# Patient Record
Sex: Male | Born: 1945
Health system: Southern US, Community
[De-identification: ages and names within clinical notes are randomized; demographics above are authoritative.]

## PROBLEM LIST (undated history)

## (undated) DIAGNOSIS — I272 Pulmonary hypertension, unspecified: Secondary | ICD-10-CM

## (undated) DIAGNOSIS — I071 Rheumatic tricuspid insufficiency: Secondary | ICD-10-CM

## (undated) DIAGNOSIS — R0600 Dyspnea, unspecified: Secondary | ICD-10-CM

## (undated) DIAGNOSIS — I340 Nonrheumatic mitral (valve) insufficiency: Secondary | ICD-10-CM

## (undated) DIAGNOSIS — M1990 Unspecified osteoarthritis, unspecified site: Secondary | ICD-10-CM

## (undated) DIAGNOSIS — B748 Other filariases: Secondary | ICD-10-CM

## (undated) HISTORY — PX: SHOULDER SURGERY: SHX246

---

## 2015-12-21 DIAGNOSIS — M9903 Segmental and somatic dysfunction of lumbar region: Secondary | ICD-10-CM | POA: Diagnosis not present

## 2015-12-21 DIAGNOSIS — M9902 Segmental and somatic dysfunction of thoracic region: Secondary | ICD-10-CM | POA: Diagnosis not present

## 2015-12-21 DIAGNOSIS — M791 Myalgia: Secondary | ICD-10-CM | POA: Diagnosis not present

## 2015-12-21 DIAGNOSIS — M7541 Impingement syndrome of right shoulder: Secondary | ICD-10-CM | POA: Diagnosis not present

## 2015-12-21 DIAGNOSIS — M4716 Other spondylosis with myelopathy, lumbar region: Secondary | ICD-10-CM | POA: Diagnosis not present

## 2015-12-21 DIAGNOSIS — M542 Cervicalgia: Secondary | ICD-10-CM | POA: Diagnosis not present

## 2015-12-21 DIAGNOSIS — M9905 Segmental and somatic dysfunction of pelvic region: Secondary | ICD-10-CM | POA: Diagnosis not present

## 2015-12-28 DIAGNOSIS — M791 Myalgia: Secondary | ICD-10-CM | POA: Diagnosis not present

## 2015-12-28 DIAGNOSIS — M9902 Segmental and somatic dysfunction of thoracic region: Secondary | ICD-10-CM | POA: Diagnosis not present

## 2015-12-28 DIAGNOSIS — M9905 Segmental and somatic dysfunction of pelvic region: Secondary | ICD-10-CM | POA: Diagnosis not present

## 2015-12-28 DIAGNOSIS — M7541 Impingement syndrome of right shoulder: Secondary | ICD-10-CM | POA: Diagnosis not present

## 2015-12-28 DIAGNOSIS — M542 Cervicalgia: Secondary | ICD-10-CM | POA: Diagnosis not present

## 2015-12-28 DIAGNOSIS — M4716 Other spondylosis with myelopathy, lumbar region: Secondary | ICD-10-CM | POA: Diagnosis not present

## 2016-05-04 DIAGNOSIS — R001 Bradycardia, unspecified: Secondary | ICD-10-CM | POA: Diagnosis not present

## 2016-07-11 DIAGNOSIS — M9905 Segmental and somatic dysfunction of pelvic region: Secondary | ICD-10-CM | POA: Diagnosis not present

## 2016-07-11 DIAGNOSIS — M9902 Segmental and somatic dysfunction of thoracic region: Secondary | ICD-10-CM | POA: Diagnosis not present

## 2016-07-11 DIAGNOSIS — M9903 Segmental and somatic dysfunction of lumbar region: Secondary | ICD-10-CM | POA: Diagnosis not present

## 2016-07-11 DIAGNOSIS — M4716 Other spondylosis with myelopathy, lumbar region: Secondary | ICD-10-CM | POA: Diagnosis not present

## 2016-07-13 DIAGNOSIS — M4716 Other spondylosis with myelopathy, lumbar region: Secondary | ICD-10-CM | POA: Diagnosis not present

## 2016-07-13 DIAGNOSIS — M9902 Segmental and somatic dysfunction of thoracic region: Secondary | ICD-10-CM | POA: Diagnosis not present

## 2016-07-13 DIAGNOSIS — M9903 Segmental and somatic dysfunction of lumbar region: Secondary | ICD-10-CM | POA: Diagnosis not present

## 2016-07-13 DIAGNOSIS — M9905 Segmental and somatic dysfunction of pelvic region: Secondary | ICD-10-CM | POA: Diagnosis not present

## 2016-07-17 DIAGNOSIS — M9903 Segmental and somatic dysfunction of lumbar region: Secondary | ICD-10-CM | POA: Diagnosis not present

## 2016-07-17 DIAGNOSIS — M9902 Segmental and somatic dysfunction of thoracic region: Secondary | ICD-10-CM | POA: Diagnosis not present

## 2016-07-17 DIAGNOSIS — M9905 Segmental and somatic dysfunction of pelvic region: Secondary | ICD-10-CM | POA: Diagnosis not present

## 2016-07-17 DIAGNOSIS — M4716 Other spondylosis with myelopathy, lumbar region: Secondary | ICD-10-CM | POA: Diagnosis not present

## 2016-07-24 DIAGNOSIS — M9905 Segmental and somatic dysfunction of pelvic region: Secondary | ICD-10-CM | POA: Diagnosis not present

## 2016-07-24 DIAGNOSIS — M9903 Segmental and somatic dysfunction of lumbar region: Secondary | ICD-10-CM | POA: Diagnosis not present

## 2016-07-24 DIAGNOSIS — M4716 Other spondylosis with myelopathy, lumbar region: Secondary | ICD-10-CM | POA: Diagnosis not present

## 2016-07-24 DIAGNOSIS — M9902 Segmental and somatic dysfunction of thoracic region: Secondary | ICD-10-CM | POA: Diagnosis not present

## 2016-07-31 ENCOUNTER — Ambulatory Visit (INDEPENDENT_AMBULATORY_CARE_PROVIDER_SITE_OTHER): Payer: BLUE CROSS/BLUE SHIELD | Admitting: Orthopedic Surgery

## 2016-07-31 ENCOUNTER — Encounter (INDEPENDENT_AMBULATORY_CARE_PROVIDER_SITE_OTHER): Payer: Self-pay | Admitting: Orthopedic Surgery

## 2016-07-31 ENCOUNTER — Ambulatory Visit (INDEPENDENT_AMBULATORY_CARE_PROVIDER_SITE_OTHER): Payer: BLUE CROSS/BLUE SHIELD

## 2016-07-31 DIAGNOSIS — M4722 Other spondylosis with radiculopathy, cervical region: Secondary | ICD-10-CM | POA: Diagnosis not present

## 2016-07-31 DIAGNOSIS — M5441 Lumbago with sciatica, right side: Secondary | ICD-10-CM

## 2016-07-31 DIAGNOSIS — M5442 Lumbago with sciatica, left side: Secondary | ICD-10-CM

## 2016-07-31 DIAGNOSIS — G8929 Other chronic pain: Secondary | ICD-10-CM | POA: Diagnosis not present

## 2016-07-31 NOTE — Progress Notes (Signed)
Office Visit Note   Patient: Keith Fisher           Date of Birth: 08/22/1946           MRN: [id number] Visit Date: 07/31/2016 Requested by: No referring provider defined for this encounter. PCP: Pcp Not In System  Subjective: Chief Complaint  Patient presents with  . Lower Back - Pain    HPI Keith Fisher is a 70 year old patient with upper and lower extremity weakness and symptoms.  He describes low back pain and bilateral leg pain.  Interestingly he had history of care site infection in the right leg which is resolved but that has left him with chronic edema in that right lower extremity.  He wears compression socks.  He states that he can't sit very long or his legs and back become very painful.  This been coming very severe over the past month.  Wakes him from sleep at night.  He's had a back issue before.  He has an old MRI scan from in the 4 years ago which I reviewed today.  The report is also reviewed.  Nothing really definitively localizing to the right hip flexor innervated muscles or the left ankle dorsiflexor innervated muscles.  His hips appeared intact on the scan as well.  He did have some cervical stenosis and some type of retro-cerebellar cystic lesion present which was not fully described but present on my inspection.  Period the patient describes significant pain in both legs radiating below the knee.  It is woken him every night for years.  He takes diclofenac twice a day for the problem.  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              Review of Systems All systems reviewed are negative as they relate to the chief complaint within the history of present illness.  Patient denies  fevers or chills.    Assessment & Plan: Visit Diagnoses:  1. Chronic bilateral low back pain with bilateral sciatica   2. Other spondylosis with radiculopathy, cervical region     Plan: Impression is right hip flexor weakness left ankle dorsiflexion  weakness with no real clear finding to explain this on MRI scan from 4 years ago.  Plain radiographs today do show some spondylosis but no spondylolisthesis.  The patient also has upper extremity interosseous wasting on the right compared to the left.  This is asymmetric also.  Cervical stenosis was present and is the more impressive finding between the lumbar and cervical spine from the MRI scan reviewed from 4 years ago.  I think no high needs MRI scan of both the neck and low back to evaluate this fairly incapacitating pain.  I don't think he is much of a complainer and so for him to be here with these symptoms is significant.  Follow-Up Instructions: Return for after MRI.   Orders:  Orders Placed This Encounter  Procedures  . XR Lumbar Spine 2-3 Views   No orders of the defined types were placed in this encounter.     Procedures: No procedures performed   Clinical Data: No additional findings.  Objective: Vital Signs: There were no vitals taken for this visit.  Physical Exam  Constitutional: He appears well-developed.  HENT:  Head: Normocephalic.  Eyes: EOM are normal.  Neck: Normal range of motion.  Cardiovascular: Normal rate.   Pulmonary/Chest: Effort normal.  Neurological: He is alert.  Skin: Skin  is warm.  Psychiatric: He has a normal mood and affect.    Ortho Exam examination of the legs demonstrates palpable pedal pulses stronger on the left than the right.  He does have fairly significant lymphedema-type swelling in the right lower extremity which is consistent with his parasite infection from the 70s he has fairly significant hip flexor weakness on the right compared to the left.  He also has ankle dorsiflexion weakness on the left compared to the right.  Reflexes 2+ out of 4 patella on the left 0 out of 4 patella on the right.  0 out of 4 bilateral Achilles.  Negative Babinski negative clonus.  There is no groin pain with internal/external rotation of either leg.  No  definite paresthesias L1-S1 bilaterally.  On the upper extremities he has interosseous weakness and wasting on the right arm compared to the left.  Pretty reasonable cervical spine range of motion.  Specialty Comments:  No specialty comments available.  Imaging: Xr Lumbar Spine 2-3 Views  Result Date: 07/31/2016 AP lateral lumbar spine ordered and obtained.  Some scoliosis is present.  Less than 20.  Hip joints maintained.  Bony pelvis otherwise normal.  There is some degenerative disc disease in the mid lumbar and lower lumbar spine area but no spondylolisthesis no evidence of compression fracture    PMFS History: There are no active problems to display for this patient.  No past medical history on file.  No family history on file.  No past surgical history on file. Social History   Occupational History  . Not on file.   Social History Main Topics  . Smoking status: Never Smoker  . Smokeless tobacco: Never Used  . Alcohol use No  . Drug use: No  . Sexual activity: Not on file

## 2016-08-02 MED ORDER — TRAMADOL HCL 50 MG PO TABS: 50.0000 mg | ORAL_TABLET | Freq: Four times a day (QID) | ORAL | 0 refills | Status: DC | PRN

## 2016-08-02 NOTE — Addendum Note (Signed)
Addended by: Rise PaganiniEAN, Callie Bunyard on: 08/02/2016 01:56 PM   Modules accepted: Orders

## 2016-08-02 NOTE — Addendum Note (Signed)
Addended by: Rise PaganiniEAN, Ronny Korff on: 08/02/2016 12:04 PM   Modules accepted: Orders

## 2016-08-12 ENCOUNTER — Ambulatory Visit
Admission: RE | Admit: 2016-08-12 | Discharge: 2016-08-12 | Disposition: A | Discharge status: Home / Self Care | Payer: BLUE CROSS/BLUE SHIELD | Source: Ambulatory Visit | Attending: Orthopedic Surgery | Admitting: Orthopedic Surgery

## 2016-08-12 DIAGNOSIS — M5126 Other intervertebral disc displacement, lumbar region: Secondary | ICD-10-CM | POA: Diagnosis not present

## 2016-08-12 DIAGNOSIS — M4802 Spinal stenosis, cervical region: Secondary | ICD-10-CM | POA: Diagnosis not present

## 2016-08-12 DIAGNOSIS — G8929 Other chronic pain: Secondary | ICD-10-CM

## 2016-08-12 DIAGNOSIS — M5442 Lumbago with sciatica, left side: Principal | ICD-10-CM

## 2016-08-12 DIAGNOSIS — M5441 Lumbago with sciatica, right side: Principal | ICD-10-CM

## 2016-08-12 DIAGNOSIS — M4722 Other spondylosis with radiculopathy, cervical region: Secondary | ICD-10-CM

## 2016-08-14 DIAGNOSIS — L409 Psoriasis, unspecified: Secondary | ICD-10-CM | POA: Diagnosis not present

## 2016-08-18 ENCOUNTER — Ambulatory Visit (INDEPENDENT_AMBULATORY_CARE_PROVIDER_SITE_OTHER): Payer: BLUE CROSS/BLUE SHIELD | Admitting: Orthopedic Surgery

## 2016-08-18 DIAGNOSIS — M5441 Lumbago with sciatica, right side: Secondary | ICD-10-CM

## 2016-08-18 DIAGNOSIS — M5442 Lumbago with sciatica, left side: Secondary | ICD-10-CM | POA: Diagnosis not present

## 2016-08-18 DIAGNOSIS — G8929 Other chronic pain: Secondary | ICD-10-CM | POA: Diagnosis not present

## 2016-08-18 MED ORDER — DICLOFENAC POTASSIUM 50 MG PO TABS: 50.0000 mg | ORAL_TABLET | Freq: Two times a day (BID) | ORAL | 0 refills | Status: DC

## 2016-08-18 MED ORDER — OXYCODONE HCL 5 MG PO CAPS: 5.0000 mg | ORAL_CAPSULE | ORAL | 0 refills | Status: DC | PRN

## 2016-08-21 ENCOUNTER — Encounter (INDEPENDENT_AMBULATORY_CARE_PROVIDER_SITE_OTHER): Payer: Self-pay | Admitting: Orthopedic Surgery

## 2016-08-21 NOTE — Progress Notes (Signed)
Office Visit Note   Patient: Keith Fisher           Date of Birth: 06/06/1946           MRN: [id number] Visit Date: 08/18/2016 Requested by: No referring provider defined for this encounter. PCP: Pcp Not In System  Subjective: No chief complaint on file. Neck and back pain  HPI mile on is a 70 year old patient here for follow-up of his of our spine cervical spine scan.  Patient's is hard for him to sit.  Lyrica does help him.  On the left side he has mostly paresthesias on the right side the paresthesias are less but is painful.  He's having some neck pain and he has seen a chiropractor. l taking diclofenac which has helped as well.  He denies any bowel or bladder symptoms.              Review of Systems All systems reviewed are negative as they relate to the chief complaint within the history of present illness.  Patient denies  fevers or chills.    Assessment & Plan: Visit Diagnoses:  1. Chronic bilateral low back pain with bilateral sciatica     Plan: Impression is pretty significant right-sided and left-sided spinal stenosis.  His scan does correlate with the bilateral symptoms he is having.  He does not have as much weakness today which is an improvement.  Nonetheless I think based on the amount of pain he is having with sitting that he would be well advised to have a neurosurgical evaluation as well as epidural steroid injection in the lumbar spine.  In regards to the neck he has pretty severe foraminal stenosis both sides at multiple levels.  That's something that can be worked up following his back which is more symptomatic.  Follow-Up Instructions: Return if symptoms worsen or fail to improve.   Orders:  Orders Placed This Encounter  Procedures  . Ambulatory referral to Physical Medicine Rehab  . Ambulatory referral to Neurosurgery   Meds ordered this encounter  Medications  . oxycodone (OXY-IR) 5 MG capsule    Sig: Take 1 capsule (5 mg total) by mouth every 4 (four) hours  as needed.    Dispense:  60 capsule    Refill:  0  . diclofenac (CATAFLAM) 50 MG tablet    Sig: Take 1 tablet (50 mg total) by mouth 2 (two) times daily.    Dispense:  60 tablet    Refill:  0      Procedures: No procedures performed   Clinical Data: No additional findings.  Objective: Vital Signs: There were no vitals taken for this visit.  Physical Exam  Constitutional: He appears well-developed.  HENT:  Head: Normocephalic.  Eyes: EOM are normal.  Neck: Normal range of motion.  Cardiovascular: Normal rate.   Pulmonary/Chest: Effort normal.  Neurological: He is alert.  Skin: Skin is warm.  Psychiatric: He has a normal mood and affect.    Ortho Exam examination of the right leg demonstrates swelling from the previously mentioned parasitic infection which is now resolved.  He does have improved hip flexion strength today 5 minus out of 5 bilaterally.  No groin pain with internal/external rotation leg.  Also has improved ankle dorsiflexion strength bilaterally.  Paresthesias present in the L4-5 distribution on both sides.  Both feet are perfused.  Negative Babinski negative clonus.  Specialty Comments:  No specialty comments available.  Imaging: No results found.   PMFS History: There are no  active problems to display for this patient.  No past medical history on file.  No family history on file.  No past surgical history on file. Social History   Occupational History  . Not on file.   Social History Main Topics  . Smoking status: Never Smoker  . Smokeless tobacco: Never Used  . Alcohol use No  . Drug use: No  . Sexual activity: Not on file

## 2016-08-22 ENCOUNTER — Telehealth (INDEPENDENT_AMBULATORY_CARE_PROVIDER_SITE_OTHER): Payer: Self-pay | Admitting: Orthopedic Surgery

## 2016-08-22 NOTE — Telephone Encounter (Signed)
Pt son stating that pt needs a referral to see Dr. Wynetta Emeryram, spine specialist. He stated Dr. August Saucerean said he wanted pt to see him. Dipesh Smith RobertRao, son, phone number is [phone]

## 2016-08-24 NOTE — Telephone Encounter (Signed)
Left message that referral was made and that we are waiting on their office to schedule appointment

## 2016-08-26 DIAGNOSIS — Z23 Encounter for immunization: Secondary | ICD-10-CM | POA: Diagnosis not present

## 2016-08-28 ENCOUNTER — Encounter (INDEPENDENT_AMBULATORY_CARE_PROVIDER_SITE_OTHER): Payer: BLUE CROSS/BLUE SHIELD | Admitting: Physical Medicine and Rehabilitation

## 2016-09-14 DIAGNOSIS — M4716 Other spondylosis with myelopathy, lumbar region: Secondary | ICD-10-CM | POA: Diagnosis not present

## 2016-09-14 DIAGNOSIS — M9905 Segmental and somatic dysfunction of pelvic region: Secondary | ICD-10-CM | POA: Diagnosis not present

## 2016-09-14 DIAGNOSIS — M9902 Segmental and somatic dysfunction of thoracic region: Secondary | ICD-10-CM | POA: Diagnosis not present

## 2016-09-14 DIAGNOSIS — M9903 Segmental and somatic dysfunction of lumbar region: Secondary | ICD-10-CM | POA: Diagnosis not present

## 2016-09-18 DIAGNOSIS — M9903 Segmental and somatic dysfunction of lumbar region: Secondary | ICD-10-CM | POA: Diagnosis not present

## 2016-09-18 DIAGNOSIS — M4716 Other spondylosis with myelopathy, lumbar region: Secondary | ICD-10-CM | POA: Diagnosis not present

## 2016-09-18 DIAGNOSIS — M9905 Segmental and somatic dysfunction of pelvic region: Secondary | ICD-10-CM | POA: Diagnosis not present

## 2016-09-18 DIAGNOSIS — M9902 Segmental and somatic dysfunction of thoracic region: Secondary | ICD-10-CM | POA: Diagnosis not present

## 2016-09-21 DIAGNOSIS — M5137 Other intervertebral disc degeneration, lumbosacral region: Secondary | ICD-10-CM | POA: Diagnosis not present

## 2016-09-21 DIAGNOSIS — M542 Cervicalgia: Secondary | ICD-10-CM | POA: Diagnosis not present

## 2016-09-21 DIAGNOSIS — G959 Disease of spinal cord, unspecified: Secondary | ICD-10-CM | POA: Diagnosis not present

## 2016-09-26 ENCOUNTER — Other Ambulatory Visit (INDEPENDENT_AMBULATORY_CARE_PROVIDER_SITE_OTHER): Payer: Self-pay | Admitting: Orthopedic Surgery

## 2016-09-26 NOTE — Telephone Encounter (Signed)
Rx request 

## 2016-10-26 ENCOUNTER — Other Ambulatory Visit (INDEPENDENT_AMBULATORY_CARE_PROVIDER_SITE_OTHER): Payer: Self-pay | Admitting: Orthopedic Surgery

## 2016-10-26 NOTE — Telephone Encounter (Signed)
Rx request 

## 2016-11-26 ENCOUNTER — Other Ambulatory Visit (INDEPENDENT_AMBULATORY_CARE_PROVIDER_SITE_OTHER): Payer: Self-pay | Admitting: Orthopedic Surgery

## 2016-11-27 NOTE — Telephone Encounter (Signed)
Rx request 

## 2016-12-04 DIAGNOSIS — L409 Psoriasis, unspecified: Secondary | ICD-10-CM | POA: Diagnosis not present

## 2016-12-23 ENCOUNTER — Other Ambulatory Visit (INDEPENDENT_AMBULATORY_CARE_PROVIDER_SITE_OTHER): Payer: Self-pay | Admitting: Orthopedic Surgery

## 2016-12-25 NOTE — Telephone Encounter (Signed)
Rx request 

## 2016-12-25 NOTE — Telephone Encounter (Signed)
Okay to refill please call thanks

## 2017-01-08 DIAGNOSIS — L2081 Atopic neurodermatitis: Secondary | ICD-10-CM | POA: Diagnosis not present

## 2017-01-16 DIAGNOSIS — B749 Filariasis, unspecified: Secondary | ICD-10-CM | POA: Diagnosis not present

## 2017-01-16 DIAGNOSIS — M4712 Other spondylosis with myelopathy, cervical region: Secondary | ICD-10-CM | POA: Diagnosis not present

## 2017-01-16 DIAGNOSIS — E538 Deficiency of other specified B group vitamins: Secondary | ICD-10-CM | POA: Diagnosis not present

## 2017-01-16 DIAGNOSIS — G959 Disease of spinal cord, unspecified: Secondary | ICD-10-CM | POA: Diagnosis not present

## 2017-01-18 ENCOUNTER — Other Ambulatory Visit (INDEPENDENT_AMBULATORY_CARE_PROVIDER_SITE_OTHER): Payer: Self-pay | Admitting: Orthopedic Surgery

## 2017-01-18 NOTE — Telephone Encounter (Signed)
Rx request 

## 2017-01-23 ENCOUNTER — Telehealth (INDEPENDENT_AMBULATORY_CARE_PROVIDER_SITE_OTHER): Payer: Self-pay | Admitting: Radiology

## 2017-01-23 NOTE — Telephone Encounter (Signed)
Ok to rf pls clal thx did he see [facility]?

## 2017-01-23 NOTE — Telephone Encounter (Signed)
This was refilled, msg to Lauren to check on [facility] eval, she will advise.

## 2017-01-23 NOTE — Telephone Encounter (Signed)
Ok to rf did he see [facility]

## 2017-01-23 NOTE — Telephone Encounter (Signed)
Can you call son and see if patient ever saw [facility]?  Dr August Saucerean approved refill diclofenac, I did this, but he wanted to know the above as well.  Thanks.

## 2017-01-23 NOTE — Telephone Encounter (Signed)
IC no answer. LM for son to please call me back to discuss.

## 2017-02-06 ENCOUNTER — Ambulatory Visit
Admission: RE | Admit: 2017-02-06 | Discharge: 2017-02-06 | Disposition: A | Discharge status: Home / Self Care | Payer: BLUE CROSS/BLUE SHIELD | Source: Ambulatory Visit | Attending: Family Medicine | Admitting: Family Medicine

## 2017-02-06 ENCOUNTER — Other Ambulatory Visit: Payer: Self-pay | Admitting: Family Medicine

## 2017-02-06 DIAGNOSIS — R079 Chest pain, unspecified: Secondary | ICD-10-CM

## 2017-02-06 DIAGNOSIS — R0602 Shortness of breath: Secondary | ICD-10-CM | POA: Diagnosis not present

## 2017-02-06 DIAGNOSIS — R42 Dizziness and giddiness: Secondary | ICD-10-CM | POA: Diagnosis not present

## 2017-02-06 DIAGNOSIS — R001 Bradycardia, unspecified: Secondary | ICD-10-CM | POA: Diagnosis not present

## 2017-02-06 DIAGNOSIS — D649 Anemia, unspecified: Secondary | ICD-10-CM | POA: Diagnosis not present

## 2017-02-07 DIAGNOSIS — R0609 Other forms of dyspnea: Secondary | ICD-10-CM | POA: Diagnosis not present

## 2017-02-07 DIAGNOSIS — R001 Bradycardia, unspecified: Secondary | ICD-10-CM | POA: Diagnosis not present

## 2017-02-07 DIAGNOSIS — M4800 Spinal stenosis, site unspecified: Secondary | ICD-10-CM | POA: Diagnosis not present

## 2017-02-07 DIAGNOSIS — R079 Chest pain, unspecified: Secondary | ICD-10-CM | POA: Diagnosis not present

## 2017-02-08 DIAGNOSIS — R0602 Shortness of breath: Secondary | ICD-10-CM | POA: Diagnosis not present

## 2017-02-08 DIAGNOSIS — R079 Chest pain, unspecified: Secondary | ICD-10-CM | POA: Diagnosis not present

## 2017-02-09 DIAGNOSIS — R0789 Other chest pain: Secondary | ICD-10-CM | POA: Diagnosis not present

## 2017-02-09 DIAGNOSIS — R0602 Shortness of breath: Secondary | ICD-10-CM | POA: Diagnosis not present

## 2017-02-15 DIAGNOSIS — M5137 Other intervertebral disc degeneration, lumbosacral region: Secondary | ICD-10-CM | POA: Diagnosis not present

## 2017-02-16 DIAGNOSIS — R079 Chest pain, unspecified: Secondary | ICD-10-CM | POA: Diagnosis not present

## 2017-02-16 DIAGNOSIS — M4800 Spinal stenosis, site unspecified: Secondary | ICD-10-CM | POA: Diagnosis not present

## 2017-02-16 DIAGNOSIS — R0609 Other forms of dyspnea: Secondary | ICD-10-CM | POA: Diagnosis not present

## 2017-03-05 DIAGNOSIS — R0609 Other forms of dyspnea: Secondary | ICD-10-CM | POA: Diagnosis present

## 2017-03-05 DIAGNOSIS — R0600 Dyspnea, unspecified: Secondary | ICD-10-CM | POA: Diagnosis present

## 2017-03-05 DIAGNOSIS — I340 Nonrheumatic mitral (valve) insufficiency: Secondary | ICD-10-CM | POA: Diagnosis present

## 2017-03-05 NOTE — H&P (Signed)
OFFICE VISIT NOTES COPIED TO EPIC FOR DOCUMENTATION  . History of Present Illness Laverda Page MD; 27-Feb-2017 4:37 PM) Patient words: Last O/V 02/07/2017; F/U Nuc, Echo.  The patient is a 71 year old male who presents for a Follow-up for Chest pain.  Additional reasons for visit:  Follow-up for Shortness of breath is described as the following: Fairly active [place] male who I had seen about a year ago for asymptomatic bradycardia. He was last seen by me on 02/07/2017 for new onset of exertional chest pain, marked dyspnea on exertion and new murmur found on physical exam. He underwent nuclear stress testing along with echocardiogram. Accompanied by son at the bedside  In the interim will past 6-8 weeks, he has noticed worsening symptoms of sciatica and also severe left arm discomfort due to cervical spinal stenosis. I had given him restriction for Ultram which he states that has helped significantly. He requests something to sleep at night as back spasms at intubating him from sleeping well at night.  States that his symptoms of dyspnea and chest pain has improved some, but states that he has not been doing anything heavy exertional activity. Patient has history of filariasis and lympedema right leg.    Problem List/Past Medical (April Garrison; 02-27-2017 9:43 AM) Dermatitis (L30.9)  Anemia (D64.9)  Bradycardia by electrocardiogram (R00.1)  Labwork  Labs 02/07/2017: Iron reduced at 37, and saturation markedly reduced at 9, normal iron binding capacity and serum transferrin. HB 10.6/HCT 32.1, platelets 194, normal indicis. Serum glucose 97 mg, BUN 17, creatinine 1.20, eGFR 60 mL, potassium 5.3. AST 47, ALT 53 minimally elevated. TSH normal. 02/19/2015: Total cholesterol 140, triglycerides 93, HDL 54, LDL 68 Exertional chest pain (R07.9)  Lexiscan myoview stress test 02/09/2017: 1. The resting electrocardiogram demonstrated normal sinus rhythm, normal resting conduction, no resting  arrhythmias and normal rest repolarization. Stress EKG is non-diagnostic for ischemia as it a pharmacologic stress using Lexiscan. Stress symptoms included dyspnea. 2. Myocardial perfusion imaging is normal. Overall left ventricular systolic function was normal without regional wall motion abnormalities. The left ventricular ejection fraction was 75%. DOE (dyspnea on exertion) (R06.09)  Echocardiogram 02/08/2017: Left ventricle cavity is normal in size. Normal global wall motion. Doppler evidence of grade II (pseudonormal) diastolic dysfunction, elevated LAP. Calculated EF 68%. Left atrial cavity is moderate to severely dilated. LA is much larger than the measured 4.8 cm in AP diameter Right atrial cavity is moderate to severely dilated. Trileaflet aortic valve with trace regurgitation. Mild prolapse of the mitral valve leaflets. Moderate to severe mitral regurgitation. Severe tricuspid regurgitation. Moderate pulmonary hypertension. Pulmonary artery systolic pressure is estimated at 51 mm Hg. Mild pulmonic regurgitation. Insignificant pericardial effusion. IVC is dilated with respiratory variation. Spinal stenosis, multilevel (M48.00)   Allergies (April Garrison; 02/27/2017 9:43 AM) No Known Drug Allergies [05/04/2016]:  Family History (April Louretta Shorten; Feb 27, 2017 9:43 AM) Mother  Deceased. late in life; no information available Father  Deceased. late in life but no other information available Siblings  Patient has a total of 11 siblings of whom only 1 has a known cardiovascular condition.  Social History (April Garrison; Feb 27, 2017 9:43 AM) Marital status  Married. Living Situation  Lives with spouse. Number of Children  1. Current tobacco use  Never smoker. Non Drinker/No Alcohol Use   Past Surgical History (April Garrison; 02/27/17 9:43 AM) Rotator Cuff surgery [2002]:  Medication History (April Garrison; Feb 27, 2017 10:01 AM) TraMADol HCl (50MG Tablet, 1 (one) Tablet Oral three times  daily, as needed,  Taken starting 02/07/2017) Active. Multivitamin Adult (1 Oral daily) Active. Clobetasol Propionate (0.05% Ointment, apply to heals External daily) Active. Omega 3 (1000MG Capsule, 1 Oral daily) Active. Iron (Ferrous Gluconate) (325MG Tablet, 1 Oral daily) Active. Medications Reconciled (verbally)  Diagnostic Studies History (April Garrison; 03-12-17 9:26 AM) Echocardiogram [02/08/2017]: Left ventricle cavity is normal in size. Normal global wall motion. Doppler evidence of grade II (pseudonormal) diastolic dysfunction, elevated LAP. Calculated EF 68%. Left atrial cavity is moderate to severely dilated. LA is much larger than the measured 4.8 cm in AP diameter Right atrial cavity is moderate to severely dilated. Trileaflet aortic valve with trace regurgitation. Mild prolapse of the mitral valve leaflets. Moderate to severe mitral regurgitation. Severe tricuspid regurgitation. Moderate pulmonary hypertension. Pulmonary artery systolic pressure is estimated at 51 mm Hg. Mild pulmonic regurgitation. Insignificant pericardial effusion. IVC is dilated with respiratory variation. Nuclear stress test [02/09/2017]: 1. The resting electrocardiogram demonstrated normal sinus rhythm, normal resting conduction, no resting arrhythmias and normal rest repolarization. Stress EKG is non-diagnostic for ischemia as it a pharmacologic stress using Lexiscan. Stress symptoms included dyspnea. 2. Myocardial perfusion imaging is normal. Overall left ventricular systolic function was normal without regional wall motion abnormalities. The left ventricular ejection fraction was 75%.    Review of Systems Laverda Page, MD; 03-12-2017 5:8 PM) General Not Present- Anorexia, Fatigue and Fever. Respiratory Present- Difficulty Breathing on Exertion. Not Present- Cough, Wakes up from Sleep Wheezing or Short of Breath and Wheezing. Cardiovascular Present- Swelling of Extremities (lymphedema  right leg). Not Present- Claudications, Orthopnea, Palpitations and Paroxysmal Nocturnal Dyspnea. Gastrointestinal Not Present- Black, Tarry Stool, Change in Bowel Habits and Nausea. Musculoskeletal Present- Back Pain and Joint Stiffness (back and bilateral hips). Neurological Not Present- Focal Neurological Symptoms and Syncope. Endocrine Not Present- Cold Intolerance, Excessive Sweating, Heat Intolerance and Thyroid Problems. Hematology Not Present- Anemia, Easy Bruising, Petechiae and Prolonged Bleeding.  Vitals (April Garrison; 03-12-17 10:03 AM) 03-12-2017 9:54 AM Weight: 184.06 lb Height: 70in Body Surface Area: 2.01 m Body Mass Index: 26.41 kg/m  Pulse: 52 (Regular)  P.OX: 95% (Room air) BP: 132/78 (Sitting, Left Arm, Standard)       Physical Exam Laverda Page, MD; 2017/03/12 5:8 PM) General Mental Status-Alert. General Appearance-Cooperative and Appears stated age. Build & Nutrition-Moderately built.  Head and Neck Thyroid Gland Characteristics - normal size and consistency and no palpable nodules.  Chest and Lung Exam Chest and lung exam reveals -quiet, even and easy respiratory effort with no use of accessory muscles, non-tender and on auscultation, normal breath sounds, no adventitious sounds.  Cardiovascular Cardiovascular examination reveals -carotid auscultation reveals no bruits, abdominal aorta auscultation reveals no bruits and no prominent pulsation, femoral artery auscultation bilaterally reveals normal pulses, no bruits, no thrills and normal pedal pulses bilaterally. Auscultation Rhythm - Regular and Bradycardic. Heart Sounds - S1 WNL and S2 WNL. Murmurs & Other Heart Sounds: Murmur: Murmur 2 - Location - Apex. Timing - Mid-systolic. Grade - II/VI. Location - Aortic Area. Timing - Early systolic. Grade - I/VI.  Abdomen Palpation/Percussion Normal exam - Non Tender and No hepatosplenomegaly.  Peripheral Vascular Lower  Extremity Palpation - Edema - Left - No edema. Right - Non-pitting edema.  Neurologic Neurologic evaluation reveals -alert and oriented x 3 with no impairment of recent or remote memory. Motor-Grossly intact without any focal deficits.  Musculoskeletal - Did not examine.    Assessment & Plan Laverda Page MD; 12-Mar-2017 5:09 PM) Exertional chest pain (R07.9) Story: Leane Call stress test  02/09/2017: 1. The resting electrocardiogram demonstrated normal sinus rhythm, normal resting conduction, no resting arrhythmias and normal rest repolarization. Stress EKG is non-diagnostic for ischemia as it a pharmacologic stress using Lexiscan. Stress symptoms included dyspnea. 2. Myocardial perfusion imaging is normal. Overall left ventricular systolic function was normal without regional wall motion abnormalities. The left ventricular ejection fraction was 75%. Impression: EKG 02/07/2017: Sinus bradycardia at rate of 54 bpm, left atrial abnormality, poor R progression, probably normal variant. No evidence of ischemia. No significant change from EKG 05/04/2016: Marked sinus bradycardia at the rate of 46 bpm. DOE (dyspnea on exertion) (R06.09) Story: Echocardiogram 02/08/2017: Left ventricle cavity is normal in size. Normal global wall motion. Doppler evidence of grade II (pseudonormal) diastolic dysfunction, elevated LAP. Calculated EF 68%. Left atrial cavity is moderate to severely dilated. LA is much larger than the measured 4.8 cm in AP diameter Right atrial cavity is moderate to severely dilated. Trileaflet aortic valve with trace regurgitation. Mild prolapse of the mitral valve leaflets. Moderate to severe mitral regurgitation. Severe tricuspid regurgitation. Moderate pulmonary hypertension. Pulmonary artery systolic pressure is estimated at 51 mm Hg. Mild pulmonic regurgitation. Insignificant pericardial effusion. IVC is dilated with respiratory variation. Spinal stenosis,  multilevel (M48.00) Current Plans Started DiazePAM 5MG, 1 (one) Tablet at bedtime as needed for sleep and back spasm, #30, 02/16/2017, Ref. x1. Labwork Story: Labs 02/07/2017: Iron reduced at 37, and saturation markedly reduced at 9, normal iron binding capacity and serum transferrin. HB 10.6/HCT 32.1, platelets 194, normal indicis. Serum glucose 97 mg, BUN 17, creatinine 1.20, eGFR 60 mL, potassium 5.3. AST 47, ALT 53 minimally elevated. TSH normal.  02/19/2015: Total cholesterol 140, triglycerides 93, HDL 54, LDL 68 Current Plans Mechanism of underlying disease process and action of medications discussed with the patient. I discussed primary/secondary prevention and also dietary counceling was done. I have reviewed the results of the stress test which is very reassuring and nonischemic. However the echocardiogram does reveal marked biatrial enlargement and severe TR and moderately severe MR. He has mild mitral valve prolapse. He also has moderate pulmonary hypertension probably related to his underlying valvular heart disease. Due to symptomatic valvular heart disease, I have recommended that we confirm this by performing a TEE and then consider either left and right heart catheterization versus CT angiogram of the heart along with thoracic and abdominal aorta for consideration for valvular surgery in view of normal nuclear stress test. However if the TEE does confirm severe MR and TR, depending on surgical consultation he may need right and left heart catheterization. With regard to back spasm and difficulty in sleeping, I have prescribed him diazepam 2.5 mg up to -5 mg to be taken at night.  I have already discussed the risks and benefits of TEE and left and right heart catheterization with the patient and his son at the bedside, including but not limited to esophageal perforation, with TEE and less than 1% risk of death and stroke, MI, need for urgent CABG with cardiac catheterization just in case  we decide to proceed with this. I'll see him back after these tests and make final recommendation.  CC: London Pepper, MD    Signed by Laverda Page, MD (02/16/2017 5:09 PM)

## 2017-03-06 ENCOUNTER — Encounter (HOSPITAL_COMMUNITY): Payer: Self-pay | Admitting: *Deleted

## 2017-03-06 ENCOUNTER — Ambulatory Visit (HOSPITAL_COMMUNITY): Payer: BLUE CROSS/BLUE SHIELD

## 2017-03-06 ENCOUNTER — Ambulatory Visit (HOSPITAL_COMMUNITY)
Admission: RE | Admit: 2017-03-06 | Discharge: 2017-03-06 | Disposition: A | Discharge status: Home / Self Care | Payer: BLUE CROSS/BLUE SHIELD | Source: Ambulatory Visit | Attending: Cardiology | Admitting: Cardiology

## 2017-03-06 ENCOUNTER — Encounter (HOSPITAL_COMMUNITY)
Admission: RE | Disposition: A | Discharge status: Home / Self Care | Payer: Self-pay | Source: Ambulatory Visit | Attending: Cardiology

## 2017-03-06 DIAGNOSIS — R0602 Shortness of breath: Secondary | ICD-10-CM | POA: Diagnosis not present

## 2017-03-06 DIAGNOSIS — R0609 Other forms of dyspnea: Secondary | ICD-10-CM | POA: Diagnosis not present

## 2017-03-06 DIAGNOSIS — R079 Chest pain, unspecified: Secondary | ICD-10-CM | POA: Diagnosis not present

## 2017-03-06 DIAGNOSIS — I340 Nonrheumatic mitral (valve) insufficiency: Secondary | ICD-10-CM | POA: Diagnosis present

## 2017-03-06 DIAGNOSIS — I272 Pulmonary hypertension, unspecified: Secondary | ICD-10-CM | POA: Diagnosis not present

## 2017-03-06 DIAGNOSIS — R0600 Dyspnea, unspecified: Secondary | ICD-10-CM | POA: Diagnosis present

## 2017-03-06 HISTORY — PX: TEE WITHOUT CARDIOVERSION: SHX5443

## 2017-03-06 SURGERY — ECHOCARDIOGRAM, TRANSESOPHAGEAL
Anesthesia: Moderate Sedation

## 2017-03-06 MED ORDER — FENTANYL CITRATE (PF) 100 MCG/2ML IJ SOLN
INTRAMUSCULAR | Status: AC
  Filled 2017-03-06: qty 2

## 2017-03-06 MED ORDER — SODIUM CHLORIDE 0.9 % IV SOLN: INTRAVENOUS | Status: DC

## 2017-03-06 MED ORDER — FENTANYL CITRATE (PF) 100 MCG/2ML IJ SOLN
INTRAMUSCULAR | Status: DC | PRN
Start: 2017-03-06 — End: 2017-03-06
  Administered 2017-03-06 (×2): 25 ug via INTRAVENOUS

## 2017-03-06 MED ORDER — MIDAZOLAM HCL 10 MG/2ML IJ SOLN
INTRAMUSCULAR | Status: DC | PRN
  Administered 2017-03-06 (×3): 1 mg via INTRAVENOUS

## 2017-03-06 MED ORDER — MIDAZOLAM HCL 5 MG/ML IJ SOLN
INTRAMUSCULAR | Status: AC
  Filled 2017-03-06: qty 2

## 2017-03-06 NOTE — CV Procedure (Signed)
TEE: Under moderate sedation, TEE was performed without complications: LV: Normal. Normal EF. RV: Borderline enlarged Mildly dilated : Normal. Left atrial appendage: Normal without thrombus. Normal function. Inter atrial septum is intact without defect.  RA: Mildly dilated  MV: Borderline prolapse of the anterior leaflet with posteriorly directed Moderate MR.  TV: Normal Moderate to severe TR, mild to moderate pulmonary hypertension AV: Normal. No AI or AS. PV: Normal. Trace PI.  Thoracic and ascending aorta: Normal without significant plaque or atheromatous changes.  Conscious sedation protocol was followed, I personally administered conscious sedation and monitored the patient. Patient received 3 milligrams of Versed and 50 mcg fentanyl . Patient tolerated the procedure well and there was no complication from conscious sedation. Time administered was 35 minutes.

## 2017-03-06 NOTE — Discharge Instructions (Signed)

## 2017-03-06 NOTE — Interval H&P Note (Signed)
History and Physical Interval Note:  03/06/2017 2:23 PM  Keith ArrowMohan S Koerner  has presented today for surgery, with the diagnosis of SHORTNESS OF BREATH  The various methods of treatment have been discussed with the patient and family. After consideration of risks, benefits and other options for treatment, the patient has consented to  Procedure(s): TRANSESOPHAGEAL ECHOCARDIOGRAM (TEE) (N/A) as a surgical intervention .  [name] history has been reviewed, patient examined, no change in status, stable for surgery.  I have reviewed the patient's chart and labs.  Questions were answered to the patient's satisfaction.     Yates DecampGANJI, Bekah Igoe

## 2017-03-06 NOTE — Progress Notes (Signed)
  Echocardiogram Echocardiogram Transesophageal has been performed.  Tye SavoyCasey N Shrinika Blatz 03/06/2017, 2:56 PM

## 2017-03-08 DIAGNOSIS — R945 Abnormal results of liver function studies: Secondary | ICD-10-CM | POA: Diagnosis not present

## 2017-03-08 DIAGNOSIS — D649 Anemia, unspecified: Secondary | ICD-10-CM | POA: Diagnosis not present

## 2017-03-12 DIAGNOSIS — M5137 Other intervertebral disc degeneration, lumbosacral region: Secondary | ICD-10-CM | POA: Diagnosis not present

## 2017-03-29 DIAGNOSIS — M9903 Segmental and somatic dysfunction of lumbar region: Secondary | ICD-10-CM | POA: Diagnosis not present

## 2017-03-29 DIAGNOSIS — M9902 Segmental and somatic dysfunction of thoracic region: Secondary | ICD-10-CM | POA: Diagnosis not present

## 2017-03-29 DIAGNOSIS — M4716 Other spondylosis with myelopathy, lumbar region: Secondary | ICD-10-CM | POA: Diagnosis not present

## 2017-03-29 DIAGNOSIS — M9905 Segmental and somatic dysfunction of pelvic region: Secondary | ICD-10-CM | POA: Diagnosis not present

## 2017-03-31 DIAGNOSIS — M9903 Segmental and somatic dysfunction of lumbar region: Secondary | ICD-10-CM | POA: Diagnosis not present

## 2017-03-31 DIAGNOSIS — M9905 Segmental and somatic dysfunction of pelvic region: Secondary | ICD-10-CM | POA: Diagnosis not present

## 2017-03-31 DIAGNOSIS — M9902 Segmental and somatic dysfunction of thoracic region: Secondary | ICD-10-CM | POA: Diagnosis not present

## 2017-03-31 DIAGNOSIS — M4716 Other spondylosis with myelopathy, lumbar region: Secondary | ICD-10-CM | POA: Diagnosis not present

## 2017-04-10 DIAGNOSIS — M9905 Segmental and somatic dysfunction of pelvic region: Secondary | ICD-10-CM | POA: Diagnosis not present

## 2017-04-10 DIAGNOSIS — M9903 Segmental and somatic dysfunction of lumbar region: Secondary | ICD-10-CM | POA: Diagnosis not present

## 2017-04-10 DIAGNOSIS — M4716 Other spondylosis with myelopathy, lumbar region: Secondary | ICD-10-CM | POA: Diagnosis not present

## 2017-04-10 DIAGNOSIS — M9902 Segmental and somatic dysfunction of thoracic region: Secondary | ICD-10-CM | POA: Diagnosis not present

## 2017-04-16 DIAGNOSIS — I340 Nonrheumatic mitral (valve) insufficiency: Secondary | ICD-10-CM | POA: Diagnosis not present

## 2017-04-16 DIAGNOSIS — D509 Iron deficiency anemia, unspecified: Secondary | ICD-10-CM | POA: Diagnosis not present

## 2017-06-28 DIAGNOSIS — L409 Psoriasis, unspecified: Secondary | ICD-10-CM | POA: Diagnosis not present

## 2017-07-03 ENCOUNTER — Other Ambulatory Visit (INDEPENDENT_AMBULATORY_CARE_PROVIDER_SITE_OTHER): Payer: Self-pay | Admitting: Orthopedic Surgery

## 2017-07-04 NOTE — Telephone Encounter (Signed)
Ok to rf pls call thx

## 2017-07-04 NOTE — Telephone Encounter (Signed)
Ok to rf? Patient not seen since last OV 08/2016 for back pain

## 2017-07-11 DIAGNOSIS — H25093 Other age-related incipient cataract, bilateral: Secondary | ICD-10-CM | POA: Diagnosis not present

## 2017-08-09 DIAGNOSIS — L851 Acquired keratosis [keratoderma] palmaris et plantaris: Secondary | ICD-10-CM | POA: Diagnosis not present

## 2017-08-22 ENCOUNTER — Other Ambulatory Visit (INDEPENDENT_AMBULATORY_CARE_PROVIDER_SITE_OTHER): Payer: Self-pay | Admitting: Orthopedic Surgery

## 2017-08-22 NOTE — Telephone Encounter (Signed)
Y he needs to have bloodwork either here or primary care and I think it would be good to change him to duexis after next month

## 2017-08-22 NOTE — Telephone Encounter (Signed)
Ok to rf? 

## 2017-09-14 ENCOUNTER — Other Ambulatory Visit (INDEPENDENT_AMBULATORY_CARE_PROVIDER_SITE_OTHER): Payer: Self-pay | Admitting: Orthopedic Surgery

## 2017-10-04 DIAGNOSIS — L851 Acquired keratosis [keratoderma] palmaris et plantaris: Secondary | ICD-10-CM | POA: Diagnosis not present

## 2017-10-18 ENCOUNTER — Other Ambulatory Visit (INDEPENDENT_AMBULATORY_CARE_PROVIDER_SITE_OTHER): Payer: Self-pay | Admitting: Orthopedic Surgery

## 2017-10-18 NOTE — Telephone Encounter (Signed)
IC LM for patient to let us know if getting labs from PCP.

## 2017-10-18 NOTE — Telephone Encounter (Signed)
Y pls call to make sure he is getting q 6 month kidney blood work at primary care thx

## 2017-10-18 NOTE — Telephone Encounter (Signed)
Ok to rf? 

## 2017-10-19 NOTE — Telephone Encounter (Signed)
thx

## 2017-11-01 DIAGNOSIS — M5137 Other intervertebral disc degeneration, lumbosacral region: Secondary | ICD-10-CM | POA: Diagnosis not present

## 2017-11-14 ENCOUNTER — Telehealth (INDEPENDENT_AMBULATORY_CARE_PROVIDER_SITE_OTHER): Payer: Self-pay | Admitting: Orthopedic Surgery

## 2017-11-14 MED ORDER — DICLOFENAC POTASSIUM 50 MG PO TABS: ORAL_TABLET | ORAL | 0 refills | Status: DC

## 2017-11-14 NOTE — Telephone Encounter (Signed)
Per patients son, he has been seeing his PCP but will make sure that patient follows up.

## 2017-11-14 NOTE — Telephone Encounter (Signed)
Ok to refill? Patient not seen since 08/2016.

## 2017-11-14 NOTE — Telephone Encounter (Signed)
Okay to refill but is he being seen by his primary care provider for blood work regarding kidney and liver function

## 2017-11-14 NOTE — Addendum Note (Signed)
Addended byPrescott Parma: Ximena Todaro on: 11/14/2017 02:44 PM   Modules accepted: Orders

## 2017-11-14 NOTE — Telephone Encounter (Signed)
Patient called asking for a refill on the diclofenac. CB # [phone]

## 2017-11-15 ENCOUNTER — Other Ambulatory Visit (INDEPENDENT_AMBULATORY_CARE_PROVIDER_SITE_OTHER): Payer: Self-pay | Admitting: Orthopedic Surgery

## 2017-11-15 MED ORDER — DICLOFENAC POTASSIUM 50 MG PO TABS: ORAL_TABLET | ORAL | 0 refills | Status: DC

## 2017-11-15 NOTE — Telephone Encounter (Signed)
Done

## 2017-11-15 NOTE — Telephone Encounter (Signed)
[facility] we could [facility] to [facility] [address].

## 2017-11-15 NOTE — Addendum Note (Signed)
Addended byPrescott Parma: Erian Lariviere on: 11/15/2017 01:17 PM   Modules accepted: Orders

## 2017-11-29 DIAGNOSIS — G959 Disease of spinal cord, unspecified: Secondary | ICD-10-CM | POA: Diagnosis not present

## 2017-11-29 DIAGNOSIS — M542 Cervicalgia: Secondary | ICD-10-CM | POA: Diagnosis not present

## 2017-11-29 DIAGNOSIS — M5137 Other intervertebral disc degeneration, lumbosacral region: Secondary | ICD-10-CM | POA: Diagnosis not present

## 2017-11-29 DIAGNOSIS — M25561 Pain in right knee: Secondary | ICD-10-CM | POA: Diagnosis not present

## 2017-12-04 DIAGNOSIS — M4716 Other spondylosis with myelopathy, lumbar region: Secondary | ICD-10-CM | POA: Diagnosis not present

## 2017-12-04 DIAGNOSIS — M9905 Segmental and somatic dysfunction of pelvic region: Secondary | ICD-10-CM | POA: Diagnosis not present

## 2017-12-04 DIAGNOSIS — M9903 Segmental and somatic dysfunction of lumbar region: Secondary | ICD-10-CM | POA: Diagnosis not present

## 2017-12-04 DIAGNOSIS — M9902 Segmental and somatic dysfunction of thoracic region: Secondary | ICD-10-CM | POA: Diagnosis not present

## 2017-12-12 DIAGNOSIS — M9903 Segmental and somatic dysfunction of lumbar region: Secondary | ICD-10-CM | POA: Diagnosis not present

## 2017-12-12 DIAGNOSIS — M9902 Segmental and somatic dysfunction of thoracic region: Secondary | ICD-10-CM | POA: Diagnosis not present

## 2017-12-12 DIAGNOSIS — M4716 Other spondylosis with myelopathy, lumbar region: Secondary | ICD-10-CM | POA: Diagnosis not present

## 2017-12-12 DIAGNOSIS — M9905 Segmental and somatic dysfunction of pelvic region: Secondary | ICD-10-CM | POA: Diagnosis not present

## 2017-12-20 DIAGNOSIS — M9905 Segmental and somatic dysfunction of pelvic region: Secondary | ICD-10-CM | POA: Diagnosis not present

## 2017-12-20 DIAGNOSIS — M4716 Other spondylosis with myelopathy, lumbar region: Secondary | ICD-10-CM | POA: Diagnosis not present

## 2017-12-20 DIAGNOSIS — M9903 Segmental and somatic dysfunction of lumbar region: Secondary | ICD-10-CM | POA: Diagnosis not present

## 2017-12-20 DIAGNOSIS — M9902 Segmental and somatic dysfunction of thoracic region: Secondary | ICD-10-CM | POA: Diagnosis not present

## 2017-12-27 DIAGNOSIS — M9903 Segmental and somatic dysfunction of lumbar region: Secondary | ICD-10-CM | POA: Diagnosis not present

## 2017-12-27 DIAGNOSIS — M9902 Segmental and somatic dysfunction of thoracic region: Secondary | ICD-10-CM | POA: Diagnosis not present

## 2017-12-27 DIAGNOSIS — M4716 Other spondylosis with myelopathy, lumbar region: Secondary | ICD-10-CM | POA: Diagnosis not present

## 2017-12-27 DIAGNOSIS — M9905 Segmental and somatic dysfunction of pelvic region: Secondary | ICD-10-CM | POA: Diagnosis not present

## 2018-01-29 DIAGNOSIS — M5137 Other intervertebral disc degeneration, lumbosacral region: Secondary | ICD-10-CM | POA: Diagnosis not present

## 2018-03-05 DIAGNOSIS — M25561 Pain in right knee: Secondary | ICD-10-CM | POA: Diagnosis not present

## 2018-03-05 DIAGNOSIS — M5137 Other intervertebral disc degeneration, lumbosacral region: Secondary | ICD-10-CM | POA: Diagnosis not present

## 2018-03-13 DIAGNOSIS — M1711 Unilateral primary osteoarthritis, right knee: Secondary | ICD-10-CM | POA: Diagnosis not present

## 2018-05-01 ENCOUNTER — Ambulatory Visit (INDEPENDENT_AMBULATORY_CARE_PROVIDER_SITE_OTHER): Payer: BLUE CROSS/BLUE SHIELD | Admitting: Orthopedic Surgery

## 2018-05-01 ENCOUNTER — Encounter (INDEPENDENT_AMBULATORY_CARE_PROVIDER_SITE_OTHER): Payer: Self-pay | Admitting: Orthopedic Surgery

## 2018-05-01 ENCOUNTER — Ambulatory Visit (INDEPENDENT_AMBULATORY_CARE_PROVIDER_SITE_OTHER): Payer: Self-pay

## 2018-05-01 DIAGNOSIS — M1712 Unilateral primary osteoarthritis, left knee: Secondary | ICD-10-CM | POA: Diagnosis not present

## 2018-05-01 DIAGNOSIS — M25561 Pain in right knee: Secondary | ICD-10-CM

## 2018-05-01 MED ORDER — LIDOCAINE HCL 1 % IJ SOLN
5.0000 mL | INTRAMUSCULAR | Status: AC | PRN
  Administered 2018-05-01: 5 mL

## 2018-05-01 MED ORDER — BUPIVACAINE HCL 0.25 % IJ SOLN
4.0000 mL | INTRAMUSCULAR | Status: AC | PRN
  Administered 2018-05-01: 4 mL via INTRA_ARTICULAR

## 2018-05-01 MED ORDER — METHYLPREDNISOLONE ACETATE 40 MG/ML IJ SUSP
40.0000 mg | INTRAMUSCULAR | Status: AC | PRN
  Administered 2018-05-01: 40 mg via INTRA_ARTICULAR

## 2018-05-01 NOTE — Progress Notes (Signed)
Office Visit Note   Patient: Keith Fisher           Date of Birth: 03/15/1946           MRN: [id number] Visit Date: 05/01/2018 Requested by: Farris HasMorrow, Aaron, MD [address] [place], [place] [address] PCP: Farris HasMorrow, Aaron, MD  Subjective: Chief Complaint  Patient presents with  . Right Knee - Pain, Injury    HPI: Patient presents for evaluation of right knee pain.'s been going on for about 6 weeks.  He did go to an urgent care and had the fluid withdrawn on July 3.  He did well until he was actually on his knee in the bathroom and then had recurrent pain and swelling.  He is in the process of getting spine injections for spinal stenosis.  He does report weakness catching and pain that wakes him from sleep at night.              ROS: All systems reviewed are negative as they relate to the chief complaint within the history of present illness.  Patient denies  fevers or chills.   Assessment & Plan: Visit Diagnoses:  1. Acute pain of right knee   2. Unilateral primary osteoarthritis, left knee     Plan: Impression is right knee arthritis with effusion.  Aspiration and injection performed today.  We will preapproved him for gel injection as well.  At some point patient may need a total knee replacement.  He has significant edema and swelling from prior disease process in [place] in the 70s.  That would be a consideration when considering total knee replacement in that right knee.  I will see him back for gel injection when this cortisone shot wears off.  Follow-Up Instructions: Return if symptoms worsen or fail to improve.   Orders:  Orders Placed This Encounter  Procedures  . XR KNEE 3 VIEW RIGHT   No orders of the defined types were placed in this encounter.     Procedures: Large Joint Inj: R knee on 05/01/2018 5:17 PM Indications: diagnostic evaluation, joint swelling and pain Details: 18 G 1.5 in needle, superolateral approach  Arthrogram: No  Medications: 5  mL lidocaine 1 %; 40 mg methylPREDNISolone acetate 40 MG/ML; 4 mL bupivacaine 0.25 % Aspirate: 70 mL yellow Outcome: tolerated well, no immediate complications Procedure, treatment alternatives, risks and benefits explained, specific risks discussed. Consent was given by the patient. Immediately prior to procedure a time out was called to verify the correct patient, procedure, equipment, support staff and site/side marked as required. Patient was prepped and draped in the usual sterile fashion.       Clinical Data: No additional findings.  Objective: Vital Signs: There were no vitals taken for this visit.  Physical Exam:   Constitutional: Patient appears well-developed HEENT:  Head: Normocephalic Eyes:EOM are normal Neck: Normal range of motion Cardiovascular: Normal rate Pulmonary/chest: Effort normal Neurologic: Patient is alert Skin: Skin is warm Psychiatric: Patient has normal mood and affect    Ortho Exam: Ortho exam demonstrates swelling and edema which is been chronic in that right leg.  Ankle dorsi flexion plantar flexion is intact.  Patient has moderate effusion in the right knee.  Extensor mechanism is intact.  Has lateral greater than medial joint line tenderness and no significant groin pain with internal/external rotation of the leg.  Specialty Comments:  No specialty comments available.  Imaging: Xr Knee 3 View Right  Result Date: 05/01/2018 AP lateral  merchant right knee reviewed.  End-stage arthritis present in the lateral compartment and patellofemoral compartment.  Patient has no fracture or dislocation.  Bone quality appears reasonable.    PMFS History: Patient Active Problem List   Diagnosis Date Noted  . Dyspnea on exertion 03/05/2017  . Mitral regurgitation 03/05/2017   History reviewed. No pertinent past medical history.  History reviewed. No pertinent family history.  Past Surgical History:  Procedure Laterality Date  . TEE WITHOUT  CARDIOVERSION N/A 03/06/2017   Procedure: TRANSESOPHAGEAL ECHOCARDIOGRAM (TEE);  Surgeon: Yates DecampGanji, Jay, MD;  [facility];  Service: Cardiovascular;  Laterality: N/A;   Social History   Occupational History  . Not on file  Tobacco Use  . Smoking status: Never Smoker  . Smokeless tobacco: Never Used  Substance and Sexual Activity  . Alcohol use: No  . Drug use: No  . Sexual activity: Not on file

## 2018-05-21 DIAGNOSIS — H18413 Arcus senilis, bilateral: Secondary | ICD-10-CM | POA: Diagnosis not present

## 2018-05-21 DIAGNOSIS — H2513 Age-related nuclear cataract, bilateral: Secondary | ICD-10-CM | POA: Diagnosis not present

## 2018-05-21 DIAGNOSIS — H35372 Puckering of macula, left eye: Secondary | ICD-10-CM | POA: Diagnosis not present

## 2018-05-21 DIAGNOSIS — H2511 Age-related nuclear cataract, right eye: Secondary | ICD-10-CM | POA: Diagnosis not present

## 2018-05-21 DIAGNOSIS — H25043 Posterior subcapsular polar age-related cataract, bilateral: Secondary | ICD-10-CM | POA: Diagnosis not present

## 2018-06-05 ENCOUNTER — Telehealth (INDEPENDENT_AMBULATORY_CARE_PROVIDER_SITE_OTHER): Payer: Self-pay

## 2018-06-05 ENCOUNTER — Ambulatory Visit (INDEPENDENT_AMBULATORY_CARE_PROVIDER_SITE_OTHER): Payer: BLUE CROSS/BLUE SHIELD | Admitting: Orthopedic Surgery

## 2018-06-05 ENCOUNTER — Encounter (INDEPENDENT_AMBULATORY_CARE_PROVIDER_SITE_OTHER): Payer: Self-pay | Admitting: Orthopedic Surgery

## 2018-06-05 DIAGNOSIS — M1711 Unilateral primary osteoarthritis, right knee: Secondary | ICD-10-CM

## 2018-06-05 NOTE — Progress Notes (Signed)
   Office Visit Note   Patient: Keith Fisher           Date of Birth: 01/15/1946           MRN: [id number] Visit Date: 06/05/2018 Requested by: Farris HasMorrow, Aaron, MD [address] [place], [place] [address] PCP: Farris HasMorrow, Aaron, MD  Subjective: Chief Complaint  Patient presents with  . Right Knee - Follow-up    HPI: Patient presents follow-up of right knee pain.  He has right knee arthritis primarily in the lateral compartment.  Had aspiration injection last clinic visit.  In general he is feeling better from that.  He wants to get back to playing some type of tennis.              ROS: All systems reviewed are negative as they relate to the chief complaint within the history of present illness.  Patient denies  fevers or chills.   Assessment & Plan: Visit Diagnoses:  1. Unilateral primary osteoarthritis, right knee     Plan: Impression is right knee arthritis improved with injection 05/01/2018.  Plan is preapproved gel injection.  When his symptoms recur we will get that started for him.  I will see him back as needed but anticipate seeing him likely before the end of the year.  We would be able to potentially start every 3 month injections in his knee to try to diminish his pain for some portion of the time that he is active.  Follow-Up Instructions: Return if symptoms worsen or fail to improve.   Orders:  No orders of the defined types were placed in this encounter.  No orders of the defined types were placed in this encounter.     Procedures: No procedures performed   Clinical Data: No additional findings.  Objective: Vital Signs: There were no vitals taken for this visit.  Physical Exam:   Constitutional: Patient appears well-developed HEENT:  Head: Normocephalic Eyes:EOM are normal Neck: Normal range of motion Cardiovascular: Normal rate Pulmonary/chest: Effort normal Neurologic: Patient is alert Skin: Skin is warm Psychiatric: Patient has normal  mood and affect    Ortho Exam: Ortho exam demonstrates slightly antalgic gait to the right.  Has trace right knee effusion.  Extensor mechanism is intact.  Collateral crucial ligaments are stable.  No groin pain with internal/external rotation of the leg and no nerve root tension signs today.  Specialty Comments:  No specialty comments available.  Imaging: No results found.   PMFS History: Patient Active Problem List   Diagnosis Date Noted  . Dyspnea on exertion 03/05/2017  . Mitral regurgitation 03/05/2017   History reviewed. No pertinent past medical history.  History reviewed. No pertinent family history.  Past Surgical History:  Procedure Laterality Date  . TEE WITHOUT CARDIOVERSION N/A 03/06/2017   Procedure: TRANSESOPHAGEAL ECHOCARDIOGRAM (TEE);  Surgeon: Yates DecampGanji, Jay, MD;  [facility];  Service: Cardiovascular;  Laterality: N/A;   Social History   Occupational History  . Not on file  Tobacco Use  . Smoking status: Never Smoker  . Smokeless tobacco: Never Used  Substance and Sexual Activity  . Alcohol use: No  . Drug use: No  . Sexual activity: Not on file

## 2018-06-05 NOTE — Telephone Encounter (Signed)
Can we please get patient approved for right knee gel injection? °

## 2018-06-06 NOTE — Telephone Encounter (Signed)
Noted  

## 2018-06-17 ENCOUNTER — Telehealth (INDEPENDENT_AMBULATORY_CARE_PROVIDER_SITE_OTHER): Payer: Self-pay

## 2018-06-17 NOTE — Telephone Encounter (Signed)
Submitted VOB for SynviscOne, right knee. 

## 2018-06-20 DIAGNOSIS — M5137 Other intervertebral disc degeneration, lumbosacral region: Secondary | ICD-10-CM | POA: Diagnosis not present

## 2018-06-21 ENCOUNTER — Telehealth (INDEPENDENT_AMBULATORY_CARE_PROVIDER_SITE_OTHER): Payer: Self-pay

## 2018-06-21 NOTE — Telephone Encounter (Signed)
PA required for SynviscOne, right knee. Faxed completed PA form to [employer] at [phone]. 

## 2018-06-26 ENCOUNTER — Telehealth (INDEPENDENT_AMBULATORY_CARE_PROVIDER_SITE_OTHER): Payer: Self-pay

## 2018-06-26 NOTE — Telephone Encounter (Signed)
Patient is approved for SynviscOne, right knee. Buy & Bill Covered at 100% of the allowed amount after $40.00 Co-Pay. PA required PA Approval# [id number] Valid 06/21/2018- 06/21/2019  Appt.09/12/2018

## 2018-07-15 DIAGNOSIS — H2511 Age-related nuclear cataract, right eye: Secondary | ICD-10-CM | POA: Diagnosis not present

## 2018-07-16 DIAGNOSIS — H2512 Age-related nuclear cataract, left eye: Secondary | ICD-10-CM | POA: Diagnosis not present

## 2018-08-05 DIAGNOSIS — H2512 Age-related nuclear cataract, left eye: Secondary | ICD-10-CM | POA: Diagnosis not present

## 2018-09-12 ENCOUNTER — Encounter (INDEPENDENT_AMBULATORY_CARE_PROVIDER_SITE_OTHER): Payer: Self-pay | Admitting: Orthopedic Surgery

## 2018-09-12 ENCOUNTER — Ambulatory Visit (INDEPENDENT_AMBULATORY_CARE_PROVIDER_SITE_OTHER): Payer: BLUE CROSS/BLUE SHIELD | Admitting: Orthopedic Surgery

## 2018-09-12 DIAGNOSIS — M1711 Unilateral primary osteoarthritis, right knee: Secondary | ICD-10-CM

## 2018-09-12 MED ORDER — HYLAN G-F 20 48 MG/6ML IX SOSY
48.0000 mg | PREFILLED_SYRINGE | INTRA_ARTICULAR | Status: AC | PRN
  Administered 2018-09-12: 48 mg via INTRA_ARTICULAR

## 2018-09-12 MED ORDER — LIDOCAINE HCL 1 % IJ SOLN
5.0000 mL | INTRAMUSCULAR | Status: AC | PRN
  Administered 2018-09-12: 5 mL

## 2018-09-12 NOTE — Progress Notes (Signed)
   Procedure Note  Patient: Keith Fisher             Date of Birth: July 16, 1946           MRN: [id number]             Visit Date: 09/12/2018  Procedures: Visit Diagnoses: Unilateral primary osteoarthritis, right knee  Large Joint Inj: R knee on 09/12/2018 12:39 PM Indications: pain, joint swelling and diagnostic evaluation Details: 18 G 1.5 in needle, superolateral approach  Arthrogram: No  Medications: 5 mL lidocaine 1 %; 48 mg Hylan 48 MG/6ML Outcome: tolerated well, no immediate complications Procedure, treatment alternatives, risks and benefits explained, specific risks discussed. Consent was given by the patient. Immediately prior to procedure a time out was called to verify the correct patient, procedure, equipment, support staff and site/side marked as required. Patient was prepped and draped in the usual sterile fashion.     Plan return in 3 months for repeat injection of cortisone into the right knee scheduled

## 2018-10-07 DIAGNOSIS — M5137 Other intervertebral disc degeneration, lumbosacral region: Secondary | ICD-10-CM | POA: Diagnosis not present

## 2018-12-09 ENCOUNTER — Telehealth (INDEPENDENT_AMBULATORY_CARE_PROVIDER_SITE_OTHER): Payer: Self-pay

## 2018-12-09 NOTE — Telephone Encounter (Signed)
Patient rescheduled 02/05/2019 at 9:00am

## 2018-12-09 NOTE — Telephone Encounter (Signed)
Dr August Saucer talked with patient via telephone Please cancel appt for 12/11/18 and reschedule for 8 weeks out.

## 2018-12-11 ENCOUNTER — Ambulatory Visit (INDEPENDENT_AMBULATORY_CARE_PROVIDER_SITE_OTHER): Payer: BLUE CROSS/BLUE SHIELD | Admitting: Orthopedic Surgery

## 2018-12-11 NOTE — Telephone Encounter (Signed)
I talked with [name] son and he is going to hold off on his appointment for a couple of months.  He is doing reasonably well at this time.

## 2018-12-13 ENCOUNTER — Ambulatory Visit (INDEPENDENT_AMBULATORY_CARE_PROVIDER_SITE_OTHER): Payer: BLUE CROSS/BLUE SHIELD | Admitting: Orthopedic Surgery

## 2018-12-24 DIAGNOSIS — M5137 Other intervertebral disc degeneration, lumbosacral region: Secondary | ICD-10-CM | POA: Diagnosis not present

## 2019-01-30 ENCOUNTER — Encounter: Payer: Self-pay | Admitting: Orthopedic Surgery

## 2019-01-30 ENCOUNTER — Ambulatory Visit: Payer: BLUE CROSS/BLUE SHIELD | Admitting: Orthopedic Surgery

## 2019-01-30 ENCOUNTER — Other Ambulatory Visit: Payer: Self-pay

## 2019-01-30 DIAGNOSIS — M1711 Unilateral primary osteoarthritis, right knee: Secondary | ICD-10-CM

## 2019-01-30 MED ORDER — METHYLPREDNISOLONE ACETATE 40 MG/ML IJ SUSP
40.0000 mg | INTRAMUSCULAR | Status: AC | PRN
  Administered 2019-01-30: 40 mg via INTRA_ARTICULAR

## 2019-01-30 MED ORDER — BUPIVACAINE HCL 0.25 % IJ SOLN
4.0000 mL | INTRAMUSCULAR | Status: AC | PRN
  Administered 2019-01-30: 4 mL via INTRA_ARTICULAR

## 2019-01-30 MED ORDER — LIDOCAINE HCL 1 % IJ SOLN
5.0000 mL | INTRAMUSCULAR | Status: AC | PRN
  Administered 2019-01-30: 17:00:00 5 mL

## 2019-01-30 NOTE — Progress Notes (Signed)
Office Visit Note   Patient: Keith Fisher           Date of Birth: 1945-10-26           MRN: [id number] Visit Date: 01/30/2019 Requested by: Farris Has, MD [address] [place], [place] [address] PCP: Farris Has, MD  Subjective: Chief Complaint  Patient presents with  . Right Knee - Pain    HPI: Patient presents for evaluation of right knee.  Has known history of right knee arthritis which is fairly severe.  Was scheduled to get an injection before the virus pandemic hip.  Did have gel injection in January and that helped him significantly.  He works at [employer] and working at [employer].              ROS: All systems reviewed are negative as they relate to the chief complaint within the history of present illness.  Patient denies  fevers or chills.   Assessment & Plan: Visit Diagnoses:  1. Unilateral primary osteoarthritis, right knee     Plan: Impression is right knee arthritis.  Cortisone injection performed today.  I think it would be a good idea to continue the gel injections every 6 months.  He is getting relief from them.  Not a great candidate for total knee replacement due to the parasite issue he has in that right leg.This patient is diagnosed with osteoarthritis of the knee(s).    Radiographs show evidence of joint space narrowing, osteophytes, subchondral sclerosis and/or subchondral cysts.  This patient has knee pain which interferes with functional and activities of daily living.    This patient has experienced inadequate response, adverse effects and/or intolerance with conservative treatments such as acetaminophen, NSAIDS, topical creams, physical therapy or regular exercise, knee bracing and/or weight loss.   This patient has experienced inadequate response or has a contraindication to intra articular steroid injections for at least 3 months.   This patient is not scheduled to have a total knee replacement within 6  months of starting treatment with viscosupplementation.   Follow-Up Instructions: No follow-ups on file.   Orders:  No orders of the defined types were placed in this encounter.  No orders of the defined types were placed in this encounter.     Procedures: Large Joint Inj: R knee on 01/30/2019 4:43 PM Indications: diagnostic evaluation, joint swelling and pain Details: 18 G 1.5 in needle, superolateral approach  Arthrogram: No  Medications: 5 mL lidocaine 1 %; 40 mg methylPREDNISolone acetate 40 MG/ML; 4 mL bupivacaine 0.25 % Outcome: tolerated well, no immediate complications Procedure, treatment alternatives, risks and benefits explained, specific risks discussed. Consent was given by the patient. Immediately prior to procedure a time out was called to verify the correct patient, procedure, equipment, support staff and site/side marked as required. Patient was prepped and draped in the usual sterile fashion.       Clinical Data: No additional findings.  Objective: Vital Signs: There were no vitals taken for this visit.  Physical Exam:   Constitutional: Patient appears well-developed HEENT:  Head: Normocephalic Eyes:EOM are normal Neck: Normal range of motion Cardiovascular: Normal rate Pulmonary/chest: Effort normal Neurologic: Patient is alert Skin: Skin is warm Psychiatric: Patient has normal mood and affect    Ortho Exam: Ortho exam demonstrates on that right knee flexion past 90 and full extension.  There is standard amount of swelling in that right calf consistent with his known diagnosis of a parasite  infection.  He has intact extensor mechanism and stable collateral ligaments.  No groin pain with internal X rotation of the leg.  Specialty Comments:  No specialty comments available.  Imaging: No results found.   PMFS History: Patient Active Problem List   Diagnosis Date Noted  . Dyspnea on exertion 03/05/2017  . Mitral regurgitation 03/05/2017    History reviewed. No pertinent past medical history.  History reviewed. No pertinent family history.  Past Surgical History:  Procedure Laterality Date  . TEE WITHOUT CARDIOVERSION N/A 03/06/2017   Procedure: TRANSESOPHAGEAL ECHOCARDIOGRAM (TEE);  Surgeon: Yates DecampGanji, Jay, MD;  [facility];  Service: Cardiovascular;  Laterality: N/A;   Social History   Occupational History  . Not on file  Tobacco Use  . Smoking status: Never Smoker  . Smokeless tobacco: Never Used  Substance and Sexual Activity  . Alcohol use: No  . Drug use: No  . Sexual activity: Not on file

## 2019-02-05 ENCOUNTER — Ambulatory Visit (INDEPENDENT_AMBULATORY_CARE_PROVIDER_SITE_OTHER): Payer: BLUE CROSS/BLUE SHIELD | Admitting: Orthopedic Surgery

## 2019-02-09 ENCOUNTER — Telehealth: Payer: Self-pay | Admitting: *Deleted

## 2019-02-09 NOTE — Telephone Encounter (Signed)
Called patient to inform him of possible Covid-19 exposure on 01/30/2019 @ [facility].  Patient does not speak english and I spoke with his emergency contact, son, Dipesh.  Free testing was offered.  Patient does not drive, wife will provide transportation and was not present to confirm a scheduling time.  Given call back number of [phone] and will prefer the [facility] campus site for testing.

## 2019-03-03 ENCOUNTER — Other Ambulatory Visit: Payer: Self-pay

## 2019-03-03 ENCOUNTER — Ambulatory Visit (INDEPENDENT_AMBULATORY_CARE_PROVIDER_SITE_OTHER): Payer: BC Managed Care – PPO | Admitting: Orthopedic Surgery

## 2019-03-03 ENCOUNTER — Ambulatory Visit: Payer: Self-pay

## 2019-03-03 DIAGNOSIS — M25561 Pain in right knee: Secondary | ICD-10-CM

## 2019-03-05 ENCOUNTER — Ambulatory Visit: Payer: Self-pay | Admitting: Orthopedic Surgery

## 2019-03-05 ENCOUNTER — Encounter: Payer: Self-pay | Admitting: Orthopedic Surgery

## 2019-03-05 NOTE — Progress Notes (Signed)
Office Visit Note   Patient: Keith Fisher           Date of Birth: 03/05/1946           MRN: [id number] Visit Date: 03/03/2019 Requested by: Farris HasMorrow, Aaron, MD [address] [place] [address] PCP: Farris HasMorrow, Aaron, MD  Subjective: Chief Complaint  Patient presents with  . Right Knee - Pain    HPI: Patient presents for evaluation of right knee pain.  I seen him in the past for known right knee arthritis.  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  Had an injection of cortisone back in May which did not help.  Gel injection in January helped more.  He has some back issues also which are being addressed.              ROS: All systems reviewed are negative as they relate to the chief complaint within the history of present illness.  Patient denies  fevers or chills.   Assessment & Plan: Visit Diagnoses:  1. Right knee pain, unspecified chronicity     Plan: Impression is right knee exacerbation of existing arthritis with not much relief from prior cortisone shot.  I am going to aspirate and inject the knee with Toradol today to see if we can get him a little bit of relief.  Also plan to preapproved him for gel injection in January which will be 6 months after his last injection.  He has a tough situation here.  He is definitely not moving as well but I think that is related both to the knee and to his back issues.  I discussed all this with his son the past via telephone call after the clinic visit.  This patient is diagnosed with osteoarthritis of the knee(s).    Radiographs show evidence of joint space narrowing, osteophytes, subchondral sclerosis and/or subchondral cysts.  This patient has knee pain which interferes with functional and activities of daily living.    This patient has experienced inadequate response, adverse effects and/or intolerance with conservative treatments such as acetaminophen,  NSAIDS, topical creams, physical therapy or regular exercise, knee bracing and/or weight loss.   This patient has experienced inadequate response or has a contraindication to intra articular steroid injections for at least 3 months.   This patient is not scheduled to have a total knee replacement within 6 months of starting treatment with viscosupplementation.   Follow-Up Instructions: No follow-ups on file.   Orders:  Orders Placed This Encounter  Procedures  . XR Knee 1-2 Views Right   No orders of the defined types were placed in this encounter.     Procedures: No procedures performed   Clinical Data: No additional findings.  Objective: Vital Signs: There were no vitals taken for this visit.  Physical Exam:   Constitutional: Patient appears well-developed HEENT:  Head: Normocephalic Eyes:EOM are normal Neck: Normal range of motion Cardiovascular: Normal rate Pulmonary/chest: Effort normal Neurologic: Patient is alert Skin: Skin is warm Psychiatric: Patient has normal mood and affect    Ortho Exam: Right leg demonstrates mild effusion in the right knee.  No effusion left knee.  He does have an antalgic gait.  Ankle dorsiflexion plantarflexion is intact.  Collateral crucial ligaments are stable.  Pedal pulses palpable.  No real warmth to the right knee versus left.  Extensor mechanism is intact.  No focal joint line tenderness is  present.  Specialty Comments:  No specialty comments available.  Imaging: No results found.   PMFS History: Patient Active Problem List   Diagnosis Date Noted  . Dyspnea on exertion 03/05/2017  . Mitral regurgitation 03/05/2017   No past medical history on file.  No family history on file.  Past Surgical History:  Procedure Laterality Date  . TEE WITHOUT CARDIOVERSION N/A 03/06/2017   Procedure: TRANSESOPHAGEAL ECHOCARDIOGRAM (TEE);  Surgeon: Adrian Prows, MD;  Location: [facility]COPY;  Service: Cardiovascular;  Laterality: N/A;    Social History   Occupational History  . Not on file  Tobacco Use  . Smoking status: Never Smoker  . Smokeless tobacco: Never Used  Substance and Sexual Activity  . Alcohol use: No  . Drug use: No  . Sexual activity: Not on file

## 2019-03-06 DIAGNOSIS — M5416 Radiculopathy, lumbar region: Secondary | ICD-10-CM | POA: Diagnosis not present

## 2019-03-06 DIAGNOSIS — M48062 Spinal stenosis, lumbar region with neurogenic claudication: Secondary | ICD-10-CM | POA: Diagnosis not present

## 2019-03-06 DIAGNOSIS — Z6827 Body mass index (BMI) 27.0-27.9, adult: Secondary | ICD-10-CM | POA: Diagnosis not present

## 2019-03-06 DIAGNOSIS — R030 Elevated blood-pressure reading, without diagnosis of hypertension: Secondary | ICD-10-CM | POA: Diagnosis not present

## 2019-03-18 ENCOUNTER — Other Ambulatory Visit: Payer: Self-pay | Admitting: Neurosurgery

## 2019-03-18 DIAGNOSIS — M48062 Spinal stenosis, lumbar region with neurogenic claudication: Secondary | ICD-10-CM

## 2019-03-26 ENCOUNTER — Ambulatory Visit
Admission: RE | Admit: 2019-03-26 | Discharge: 2019-03-26 | Disposition: A | Discharge status: Home / Self Care | Payer: BC Managed Care – PPO | Source: Ambulatory Visit | Attending: Neurosurgery | Admitting: Neurosurgery

## 2019-03-26 ENCOUNTER — Other Ambulatory Visit: Payer: Self-pay

## 2019-03-26 DIAGNOSIS — M48061 Spinal stenosis, lumbar region without neurogenic claudication: Secondary | ICD-10-CM | POA: Diagnosis not present

## 2019-03-26 DIAGNOSIS — M48062 Spinal stenosis, lumbar region with neurogenic claudication: Secondary | ICD-10-CM

## 2019-03-27 ENCOUNTER — Telehealth: Payer: Self-pay

## 2019-03-27 DIAGNOSIS — G959 Disease of spinal cord, unspecified: Secondary | ICD-10-CM | POA: Diagnosis not present

## 2019-03-27 DIAGNOSIS — M542 Cervicalgia: Secondary | ICD-10-CM | POA: Diagnosis not present

## 2019-03-27 DIAGNOSIS — M5416 Radiculopathy, lumbar region: Secondary | ICD-10-CM | POA: Diagnosis not present

## 2019-03-27 NOTE — Telephone Encounter (Signed)
Approved for SynviscOne, right knee. Buy & Bill Covered at 100% after a Co-pay Co-pay of $115.00 required PA required PA Approval# [id number] Valid 06/21/2018- 06/21/2019  Appt.04/02/2019 with Dr. Marlou Sa

## 2019-03-29 ENCOUNTER — Other Ambulatory Visit: Payer: Self-pay | Admitting: Neurosurgery

## 2019-03-29 DIAGNOSIS — G959 Disease of spinal cord, unspecified: Secondary | ICD-10-CM

## 2019-04-02 ENCOUNTER — Ambulatory Visit: Payer: BLUE CROSS/BLUE SHIELD | Admitting: Orthopedic Surgery

## 2019-04-13 ENCOUNTER — Other Ambulatory Visit: Payer: Self-pay

## 2019-04-13 ENCOUNTER — Ambulatory Visit
Admission: RE | Admit: 2019-04-13 | Discharge: 2019-04-13 | Disposition: A | Discharge status: Home / Self Care | Payer: BC Managed Care – PPO | Source: Ambulatory Visit | Attending: Neurosurgery | Admitting: Neurosurgery

## 2019-04-13 DIAGNOSIS — G959 Disease of spinal cord, unspecified: Secondary | ICD-10-CM

## 2019-04-13 DIAGNOSIS — M4802 Spinal stenosis, cervical region: Secondary | ICD-10-CM | POA: Diagnosis not present

## 2019-04-15 DIAGNOSIS — Z6827 Body mass index (BMI) 27.0-27.9, adult: Secondary | ICD-10-CM | POA: Diagnosis not present

## 2019-04-15 DIAGNOSIS — G959 Disease of spinal cord, unspecified: Secondary | ICD-10-CM | POA: Diagnosis not present

## 2019-04-15 DIAGNOSIS — R030 Elevated blood-pressure reading, without diagnosis of hypertension: Secondary | ICD-10-CM | POA: Diagnosis not present

## 2019-04-16 DIAGNOSIS — D509 Iron deficiency anemia, unspecified: Secondary | ICD-10-CM | POA: Diagnosis not present

## 2019-04-16 DIAGNOSIS — R945 Abnormal results of liver function studies: Secondary | ICD-10-CM | POA: Diagnosis not present

## 2019-04-16 DIAGNOSIS — M542 Cervicalgia: Secondary | ICD-10-CM | POA: Diagnosis not present

## 2019-04-16 DIAGNOSIS — R7309 Other abnormal glucose: Secondary | ICD-10-CM | POA: Diagnosis not present

## 2019-04-16 DIAGNOSIS — R7989 Other specified abnormal findings of blood chemistry: Secondary | ICD-10-CM | POA: Diagnosis not present

## 2019-04-16 DIAGNOSIS — M7989 Other specified soft tissue disorders: Secondary | ICD-10-CM | POA: Diagnosis not present

## 2019-04-16 DIAGNOSIS — M489 Spondylopathy, unspecified: Secondary | ICD-10-CM | POA: Diagnosis not present

## 2019-04-16 DIAGNOSIS — Z01818 Encounter for other preprocedural examination: Secondary | ICD-10-CM | POA: Diagnosis not present

## 2019-04-24 ENCOUNTER — Encounter: Payer: Self-pay | Admitting: Cardiology

## 2019-04-24 ENCOUNTER — Other Ambulatory Visit: Payer: Self-pay

## 2019-04-24 ENCOUNTER — Ambulatory Visit: Payer: BC Managed Care – PPO | Admitting: Cardiology

## 2019-04-24 VITALS — BP 133/50 | HR 53 | Ht 70.0 in | Wt 191.0 lb

## 2019-04-24 DIAGNOSIS — R001 Bradycardia, unspecified: Secondary | ICD-10-CM

## 2019-04-24 DIAGNOSIS — Z01810 Encounter for preprocedural cardiovascular examination: Secondary | ICD-10-CM | POA: Diagnosis not present

## 2019-04-24 DIAGNOSIS — I361 Nonrheumatic tricuspid (valve) insufficiency: Secondary | ICD-10-CM

## 2019-04-24 DIAGNOSIS — M4803 Spinal stenosis, cervicothoracic region: Secondary | ICD-10-CM

## 2019-04-24 DIAGNOSIS — I341 Nonrheumatic mitral (valve) prolapse: Secondary | ICD-10-CM

## 2019-04-24 MED ORDER — GABAPENTIN 300 MG PO CAPS: 300.0000 mg | ORAL_CAPSULE | Freq: Three times a day (TID) | ORAL | 1 refills | Status: DC

## 2019-04-24 NOTE — Progress Notes (Signed)
Primary Physician/Referring:  London Pepper, MD  Patient ID: Keith Fisher, male    DOB: 1945-12-13, 73 y.o.   MRN: [id number]  Chief Complaint  Patient presents with  . Mitral Regurgitation  . Follow-up   HPI:    HPI: Keith Fisher  is a 73 y.o. Patient with chronic lymphedema of the right lower except he due to his many axis, moderate mitral regurgitation secondary to mitral prolapse and moderate tricuspid regurgitation similarly, with moderate pulmonary hypertension by echocardiogram in 2018, he is very athletic and used to play tennis fairly rigorously, he has had chronic back pain and spinal stenosis, has got severe symptoms of back pain, has developed severe weakness and bilateral lower extremity and also upper extremity.  He has not been scheduled for cervical spine surgery and he is sent here for preoperative cardiac risk stratification.  Otherwise previously asymptomatic prior to 3-4 months ago but last 3-4 months he has been extremely debilitated.  He is in constant pain as well both in his upper extremity and lower extremity.  Has had difficulty walking and ablating.  History reviewed. No pertinent past medical history. Past Surgical History:  Procedure Laterality Date  . SHOULDER SURGERY    . TEE WITHOUT CARDIOVERSION N/A 03/06/2017   Procedure: TRANSESOPHAGEAL ECHOCARDIOGRAM (TEE);  Surgeon: Adrian Prows, MD;  Location: [facility] ENDOSCOPY;  Service: Cardiovascular;  Laterality: N/A;   Social History   Socioeconomic History  . Marital status: Married    Spouse name: Not on file  . Number of children: 1  . Years of education: Not on file  . Highest education level: Not on file  Occupational History  . Not on file  Social Needs  . Financial resource strain: Not on file  . Food insecurity    Worry: Not on file    Inability: Not on file  . Transportation needs    Medical: Not on file    Non-medical: Not on file  Tobacco Use  . Smoking status: Never Smoker  . Smokeless tobacco:  Never Used  Substance and Sexual Activity  . Alcohol use: No  . Drug use: No  . Sexual activity: Not on file  Lifestyle  . Physical activity    Days per week: Not on file    Minutes per session: Not on file  . Stress: Not on file  Relationships  . Social Herbalist on phone: Not on file    Gets together: Not on file    Attends religious service: Not on file    Active member of club or organization: Not on file    Attends meetings of clubs or organizations: Not on file    Relationship status: Not on file  . Intimate partner violence    Fear of current or ex partner: Not on file    Emotionally abused: Not on file    Physically abused: Not on file    Forced sexual activity: Not on file  Other Topics Concern  . Not on file  Social History Narrative  . Not on file   ROS  Review of Systems  Constitution: Negative for chills, decreased appetite, malaise/fatigue and weight gain.  Cardiovascular: Positive for leg swelling (right leg lynphedema chronic). Negative for dyspnea on exertion and syncope.  Endocrine: Negative for cold intolerance.  Hematologic/Lymphatic: Does not bruise/bleed easily.  Musculoskeletal: Positive for arthritis, back pain, muscle cramps and muscle weakness. Negative for joint swelling.  Gastrointestinal: Negative for abdominal pain, anorexia, change in  bowel habit, hematochezia and melena.  Neurological: Positive for numbness (legs and hands) and paresthesias. Negative for headaches and light-headedness.  Psychiatric/Behavioral: Negative for depression and substance abuse.  All other systems reviewed and are negative.  Objective  Blood pressure (!) 133/50, pulse (!) 53, height '5\' 10"'  (1.778 m), weight 191 lb (86.6 kg), SpO2 100 %. Body mass index is 27.41 kg/m.   Physical Exam  Constitutional: He appears well-developed and well-nourished. No distress.  HENT:  Head: Atraumatic.  Eyes: Conjunctivae are normal.  Neck: Neck supple. No JVD present.  No thyromegaly present.  Cardiovascular: Normal rate, regular rhythm, S1 normal, S2 normal, intact distal pulses and normal pulses. Exam reveals no gallop.  Murmur heard.  Crescendo-decrescendo mid to late systolic murmur is present with a grade of 2/6 at the apex. Pulses:      Carotid pulses are 2+ on the right side and 2+ on the left side. Pulmonary/Chest: Effort normal and breath sounds normal.  Abdominal: Soft. Bowel sounds are normal.  Musculoskeletal: Normal range of motion.        General: No edema.  Neurological: He is alert.  Skin: Skin is warm and dry.  Psychiatric: He has a normal mood and affect.   Radiology: No results found.  Laboratory examination:   Labs 02/07/2017: Iron reduced at 37, and saturation markedly reduced at 9, normal iron binding capacity and serum transferrin.  HB 10.6/HCT 32.1, platelets 194, normal indicis.    Serum glucose 97 mg, BUN 17, creatinine 1.20, eGFR 60 mL, potassium 5.3.  AST 47, ALT 53 minimally elevated.  TSH normal.  No flowsheet data found. No flowsheet data found. Lipid Panel  No results found for: CHOL, TRIG, HDL, CHOLHDL, VLDL, LDLCALC, LDLDIRECT HEMOGLOBIN A1C No results found for: HGBA1C, MPG TSH No results for input(s): TSH in the last 8760 hours. Medications   Current Outpatient Medications  Medication Instructions  . diclofenac (CATAFLAM) 50 MG tablet TAKE 1 TABLET(50 MG) BY MOUTH TWICE DAILY  . gabapentin (NEURONTIN) 300 mg, Oral, 3 times daily  . Multiple Vitamin (MULTIVITAMIN) tablet 1 tablet, Oral, Daily  . oxycodone (OXY-IR) 5 mg, Oral, Every 4 hours PRN  . TURMERIC PO Oral, Daily    Cardiac Studies:   Lexiscan myoview stress test 02/09/2017: 1. The resting electrocardiogram demonstrated normal sinus rhythm, normal resting conduction, no resting arrhythmias and normal rest repolarization.  Stress EKG is non-diagnostic for ischemia as it a pharmacologic stress using Lexiscan. Stress symptoms included dyspnea. 2.  Myocardial perfusion imaging is normal. Overall left ventricular systolic function was normal without regional wall motion abnormalities. The left ventricular ejection fraction was 75%.  Echocardiogram 02/08/2017: Left ventricle cavity is normal in size. Normal global wall motion. Doppler evidence of grade II (pseudonormal) diastolic dysfunction, elevated LAP. Calculated EF 68%. Left atrial cavity is moderate to severely dilated. LA is much larger than the measured 4.8 cm in AP diameter Right atrial cavity is moderate to severely dilated. Trileaflet aortic valve with trace regurgitation. Mild prolapse of the mitral valve leaflets. Moderate to severe mitral regurgitation. Severe tricuspid regurgitation. Moderate pulmonary hypertension. Pulmonary artery systolic pressure is estimated at 51 mm Hg. Mild pulmonic regurgitation. Insignificant pericardial effusion. IVC is dilated with respiratory variation.  TEE 03/06/2017: Normal LV systolic function. Mild prolapse of the anterior MV leaflet with posteriorly directed Moderate MR. Moderate  TR with moderate pulmonary hypertension. Mild biatrial enlargement and mild RV dilatation. Normal thoracic aorta.  Assessment     ICD-10-CM   1. Mitral valve  prolapse  I34.1 EKG 12-Lead    PCV ECHOCARDIOGRAM COMPLETE  2. Bradycardia  R00.1   3. Nonrheumatic tricuspid valve regurgitation  I36.1 PCV ECHOCARDIOGRAM COMPLETE  4. Pre-operative cardiovascular examination  Z01.810    Cervical spine fusion date to scheduled Dr. Kary Kos   5. Spinal stenosis of cervicothoracic region  M48.03 gabapentin (NEURONTIN) 300 MG capsule   EKG 04/24/2019: Marked sinus bradycardia at rate of 58 bpm, left atrial enlargement, normal axis.  No evidence of ischemia otherwise normal EKG. No significant change from  EKG 02/07/2017: Sinus bradycardia at rate of 54 bpm.  Recommendations:   Patient presents here for preoperative cardiovascular stratification, for upcoming cervical  spine surgery and he may indeed need lumbosacral surgery as well.  He is in severe pain, neuropathic pain from spinal stenosis and has developed severe weakness in both upper and lower extremity.  He is had a nuclear stress test which was negative for myocardial ischemia, in 2018, he can be taken up for the upcoming surgery with acceptable cardiovascular risk.  He does indeed have moderate mitral valve regurgitation, I would like to obtain an echocardiogram prior to surgery as he also had moderate TR and moderate pulmonary hypertension.  His physical examination remains unchanged from prior examination, there is no clinical evidence of heart failure either.  No change in his EKG.  I'll like to try Neurontin for his pain, if he does not find any improvement he can certainly stop this.  Adrian Prows, MD, [facility] 04/24/2019, 2:48 PM [facility]. Wing Pager: [phone] Office: [phone] If no answer Cell [phone]

## 2019-04-29 ENCOUNTER — Other Ambulatory Visit: Payer: Self-pay

## 2019-04-29 ENCOUNTER — Ambulatory Visit (INDEPENDENT_AMBULATORY_CARE_PROVIDER_SITE_OTHER): Payer: BC Managed Care – PPO

## 2019-04-29 DIAGNOSIS — I341 Nonrheumatic mitral (valve) prolapse: Secondary | ICD-10-CM

## 2019-04-29 DIAGNOSIS — I361 Nonrheumatic tricuspid (valve) insufficiency: Secondary | ICD-10-CM | POA: Diagnosis not present

## 2019-05-01 ENCOUNTER — Encounter: Payer: Self-pay | Admitting: Cardiology

## 2019-05-02 ENCOUNTER — Other Ambulatory Visit: Payer: Self-pay | Admitting: Neurosurgery

## 2019-05-02 NOTE — Progress Notes (Signed)
Pt aware.

## 2019-05-07 ENCOUNTER — Encounter (HOSPITAL_COMMUNITY): Payer: Self-pay

## 2019-05-07 ENCOUNTER — Encounter (HOSPITAL_COMMUNITY)
Admission: RE | Admit: 2019-05-07 | Discharge: 2019-05-07 | Disposition: A | Discharge status: Home / Self Care | Payer: BC Managed Care – PPO | Source: Ambulatory Visit | Attending: Neurosurgery | Admitting: Neurosurgery

## 2019-05-07 ENCOUNTER — Other Ambulatory Visit: Payer: Self-pay

## 2019-05-07 DIAGNOSIS — Z79899 Other long term (current) drug therapy: Secondary | ICD-10-CM | POA: Diagnosis not present

## 2019-05-07 DIAGNOSIS — I10 Essential (primary) hypertension: Secondary | ICD-10-CM | POA: Diagnosis not present

## 2019-05-07 DIAGNOSIS — Z01812 Encounter for preprocedural laboratory examination: Secondary | ICD-10-CM | POA: Insufficient documentation

## 2019-05-07 DIAGNOSIS — I340 Nonrheumatic mitral (valve) insufficiency: Secondary | ICD-10-CM | POA: Insufficient documentation

## 2019-05-07 DIAGNOSIS — B748 Other filariases: Secondary | ICD-10-CM | POA: Diagnosis not present

## 2019-05-07 DIAGNOSIS — I071 Rheumatic tricuspid insufficiency: Secondary | ICD-10-CM | POA: Insufficient documentation

## 2019-05-07 HISTORY — DX: Dyspnea, unspecified: R06.00

## 2019-05-07 HISTORY — DX: Pulmonary hypertension, unspecified: I27.20

## 2019-05-07 HISTORY — DX: Nonrheumatic mitral (valve) insufficiency: I34.0

## 2019-05-07 HISTORY — DX: Other filariases: B74.8

## 2019-05-07 HISTORY — DX: Rheumatic tricuspid insufficiency: I07.1

## 2019-05-07 LAB — BASIC METABOLIC PANEL
Anion gap: 8 (ref 5–15)
BUN: 14 mg/dL (ref 8–23)
CO2: 26 mmol/L (ref 22–32)
Calcium: 9.1 mg/dL (ref 8.9–10.3)
Chloride: 105 mmol/L (ref 98–111)
Creatinine, Ser: 1.29 mg/dL — ABNORMAL HIGH (ref 0.61–1.24)
GFR calc Af Amer: >60 mL/min (ref >60)
GFR calc non Af Amer: 55 mL/min — ABNORMAL LOW (ref >60)
Glucose, Bld: 137 mg/dL — ABNORMAL HIGH (ref 70–99)
Potassium: 4.5 mmol/L (ref 3.5–5.1)
Sodium: 139 mmol/L (ref 135–145)

## 2019-05-07 LAB — SURGICAL PCR SCREEN
MRSA, PCR: NEGATIVE
Staphylococcus aureus: NEGATIVE

## 2019-05-07 LAB — CBC
HCT: 40.1 % (ref 39.0–52.0)
Hemoglobin: 13.2 g/dL (ref 13.0–17.0)
MCH: 30.9 pg (ref 26.0–34.0)
MCHC: 32.9 g/dL (ref 30.0–36.0)
MCV: 93.9 fL (ref 80.0–100.0)
Platelets: 232 10*3/uL (ref 150–400)
RBC: 4.27 MIL/uL (ref 4.22–5.81)
RDW: 11.8 % (ref 11.5–15.5)
WBC: 6.4 10*3/uL (ref 4.0–10.5)
nRBC: 0 % (ref 0.0–0.2)

## 2019-05-07 NOTE — Pre-Procedure Instructions (Signed)
ANTONY SIAN  05/07/2019      [id number] [place], [address] AT [address] [place] [address] Phone: [phone] Fax: [place], [place] [address] [place] [address] Phone: [phone] Fax: [phone]  [id number] - [place], [address] AT [address] [place] [address] Phone: [phone] Fax: [phone]    Your procedure is scheduled on 05/14/19.  Report to [facility] Admitting at 730 A.M.  Call this number if you have problems the morning of surgery:  [phone]   Remember:   Take these medicines the morning of surgery with A SIP OF WATER ----NEURONTIN    Do not wear jewelry, make-up or nail polish.  Do not wear lotions, powders, or perfumes, or deodorant.  Do not shave 48 hours prior to surgery.  Men may shave face and neck.  Do not bring valuables to the hospital.  [facility] is not responsible for any belongings or valuables.  Contacts, dentures or bridgework may not be worn into surgery.  Leave your suitcase in the car.  After surgery it may be brought to your room.  For patients admitted to the hospital, discharge time will be determined by your treatment team.  Patients discharged the day of surgery will not be allowed to drive home.    Special instructions:  Do not take any aspirin,anti-inflammatories,vitamins,or herbal supplements 5-7 days prior to surgery.[place] - Preparing for Surgery  Before surgery, you can play an important role.  Because skin is not sterile, your skin needs to be as free of germs as possible.  You can reduce the number of germs on you skin by washing with CHG (chlorahexidine gluconate) soap before surgery.  CHG is an antiseptic cleaner which kills germs and bonds with the  skin to continue killing germs even after washing.  Oral Hygiene is also important in reducing the risk of infection.  Remember to brush your teeth with your regular toothpaste the morning of surgery.  Please DO NOT use if you have an allergy to CHG or antibacterial soaps.  If your skin becomes reddened/irritated stop using the CHG and inform your nurse when you arrive at Short Stay.  Do not shave (including legs and underarms) for at least 48 hours prior to the first CHG shower.  You may shave your face.  Please follow these instructions carefully:   1.  Shower with CHG Soap the night before surgery and the morning of Surgery.  2.  If you choose to wash your hair, wash your hair first as usual with your normal shampoo.  3.  After you shampoo, rinse your hair and body thoroughly to remove the shampoo. 4.  Use CHG as you would any other liquid soap.  You can apply chg directly to the skin and wash gently with a      scrungie or washcloth.           5.  Apply the CHG Soap to your body ONLY FROM THE NECK DOWN.   Do not use on open wounds or open sores. Avoid contact with your eyes, ears, mouth and genitals (private parts).  Wash genitals (private parts) with your normal soap.  6.  Wash thoroughly, paying special attention to the area  where your surgery will be performed.  7.  Thoroughly rinse your body with warm water from the neck down.  8.  DO NOT shower/wash with your normal soap after using and rinsing off the CHG Soap.  9.  Pat yourself dry with a clean towel.            10.  Wear clean pajamas.            11.  Place clean sheets on your bed the night of your first shower and do not sleep with pets.  Day of Surgery  Do not apply any lotions/deoderants the morning of surgery.   Please wear clean clothes to the hospital/surgery center. Remember to brush your teeth with toothpaste.    Please read over the following fact sheets that you were given. MRSA Information

## 2019-05-07 NOTE — Progress Notes (Signed)
PCP -DR Orland Mustard  Cardiologist - D GANJI---CLEARANCE DONE  Chest x-ray - NA EKG - 8/20  ECHO - 8/20   CPAP -     : Aspirin Instructions:STOP Anesthesia review: REVIEW H X      Patient denies shortness of breath, fever, cough and chest pain at PAT appointment   Patient verbalized understanding of instructions that were given to them at the PAT appointment. Patient was also instructed that they will need to review over the PAT instructions again at home before surgery.

## 2019-05-08 ENCOUNTER — Encounter (HOSPITAL_COMMUNITY): Payer: Self-pay

## 2019-05-08 NOTE — Anesthesia Preprocedure Evaluation (Addendum)
Anesthesia Evaluation  Patient identified by MRN, date of birth, ID band Patient awake    Reviewed: Allergy & Precautions, NPO status , Patient's Chart, lab work & pertinent test results  Airway Mallampati: II  TM Distance: >3 FB     Dental  (+) Teeth Intact, Chipped, Loose,    Pulmonary    breath sounds clear to auscultation       Cardiovascular negative cardio ROS   Rhythm:Regular Rate:Normal     Neuro/Psych    GI/Hepatic negative GI ROS, Neg liver ROS,   Endo/Other  negative endocrine ROS  Renal/GU negative Renal ROS     Musculoskeletal   Abdominal   Peds  Hematology   Anesthesia Other Findings   Reproductive/Obstetrics                           Anesthesia Physical Anesthesia Plan  ASA: II  Anesthesia Plan: General   Post-op Pain Management:    Induction: Intravenous  PONV Risk Score and Plan: 2 and Ondansetron, Dexamethasone and Midazolam  Airway Management Planned: Oral ETT  Additional Equipment: Arterial line  Intra-op Plan:   Post-operative Plan: Possible Post-op intubation/ventilation  Informed Consent:     Dental advisory given  Plan Discussed with: CRNA, Anesthesiologist and Surgeon  Anesthesia Plan Comments: (PAT note written 05/08/2019 by Myra Gianotti, PA-C. Speaks Hindi. )     Anesthesia Quick Evaluation

## 2019-05-08 NOTE — Progress Notes (Addendum)
Anesthesia Chart Review:  Case: [id number] Date/Time: 05/14/19 0915   Procedure: Posterior Cervical Fusion and decompression with lateral mass fixation - C3-C4 - C4-C5 - C5-C6 - C6-C7 (N/A )   Anesthesia type: General   Pre-op diagnosis: Myelopathy cervical   [facility] ROOM 21 / [facility] OR   Surgeon: Donalee Citrinram, [place], MD      DISCUSSION: Patient is a 73 year old male scheduled for the above procedure.  History includes never smoker, dyspnea, mitral regurgitation (MVP with moderate MR, stable 04/2019), tricuspid regurgitation (moderate-severe 04/2019), pulmonary hypertension (estimated PASP 49 mm Hg with RA pressure estimated at  8 mmHg by echo 04/2019), lymphatic filariasis (with lymphedema RLE).   He was evaluated by Dr. Jacinto HalimGanji on 04/24/19 for preoperative evaluation for c-spine surgery, but may need lumbosacral surgery as well in the near future. Echo repeated to follow-up MR, TR, pulmonary hypertension. Results felt stable, and "surgical clearance sent to Dr. Wynetta Emeryram."  Presurgical COVID-19 test scheduled for 05/12/2019.  If negative and otherwise no acute changes, then I would anticipate that he could proceed as planned. He speaks Hindi and signed a Art gallery manager"Waiver of Right to [employer]", and instead requesting Dipesh Smith RobertRao serve as his interpretor.   VS: BP (!) 115/59   Pulse 62   Temp (!) 36.2 C   Resp 18   Ht 5\' 10"  (1.778 m)   Wt 79.4 kg   SpO2 98%   BMI 25.12 kg/m   PROVIDERS: Farris HasMorrow, Aaron, MD is PCP Yates DecampGanji, Jay, MD is cardiologist   LABS: Labs reviewed: Acceptable for surgery. (all labs ordered are listed, but only abnormal results are displayed)  Labs Reviewed  BASIC METABOLIC PANEL - Abnormal; Notable for the following components:      Result Value   Glucose, Bld 137 (*)    Creatinine, Ser 1.29 (*)    GFR calc non Af Amer 55 (*)    All other components within normal limits  SURGICAL PCR SCREEN  CBC     IMAGES: MRI C-spine 04/13/19: IMPRESSION: - Stable C6 and C7  gliosis and myelomalacia, presumed chronic C6-7 stenosis. - Multifactorial stenosis at C3-4, and C5-6, some cord flattening but no abnormal cord signal, also stable from 2017. - LEFT paramedian posterior fossa arachnoid cyst appears moderately large, but is incompletely evaluated. Consider noncontrast head CT for further evaluation.  MRI L-spine 03/26/19: IMPRESSION: 1. Diffuse lumbar spine spondylosis as detailed above. 2.  No acute osseous injury of the lumbar spine. 3. No significant interval change compared with 08/12/2016. - See full report under Result Review tab   EKG: 04/24/19 [facility]): SB at 58 bpm   CV: Echocardiogram (TTE) 04/29/19 [facility]):  1. Normal LV systolic function with EF 66%. Left ventricle cavity is normal in size. Mild concentric hypertrophy of the left ventricle. Normal global wall motion. Calculated EF 66%. 2. Left atrial cavity is mildly dilated by volume The interatrial Septum is thin and mobile but appears to be intact by 2D and CF Doppler interrogation. 3. Right atrial cavity is moderately dilated. 4. Right ventricle cavity is mildly dilated. Normal right ventricular function. A moderateor band is noted at the RV apex (normal variant). 5. Mild prolapse of both the mitral valve leaflets. No significant myxomatous degeneration.  Moderate (Grade III) mitral regurgitation. 6.  Mild dilation of the tricuspid valve annulus. Moderate to severe tricuspid regurgitation. Moderate pulmonary hypertension. Estimated pulmonary artery systolic pressure is 49 mm Hg with RA pressure estimated at  8 mm Hg.  7. IVC is normal with blunted respiratory response. This may suggest elevated right heart pressure. 8. Compared to the study done on 02/08/2017, no significant change overall.  TEE 03/06/17: LV: Normal. Normal EF. RV: Borderline enlarged Mildly dilated : Normal. Left atrial appendage: Normal without thrombus. Normal function. Inter atrial  septum is intact without defect.  RA: Mildly dilated  MV: Borderline prolapse of the anterior leaflet with posteriorly directed Moderate MR.  TV: Normal Moderate to severe TR, mild to moderate pulmonary hypertension AV: Normal. No AI or AS. PV: Normal. Trace PI. Thoracic and ascending aorta: Normal without significant plaque or atheromatous changes.  Lexiscan myoview stress test 02/09/17 [facility]): 1. The resting electrocardiogram demonstrated normal sinus rhythm, normal resting conduction, no resting arrhythmias and normal rest repolarization. Stress EKG is non-diagnostic for ischemia as it a pharmacologic stress using Lexiscan. Stress symptoms included dyspnea. 2. Myocardial perfusion imaging is normal. Overall left ventricular systolic function was normal without regional wall motion abnormalities. The left ventricular ejection fraction was 75%.   Past Medical History:  Diagnosis Date  . Dyspnea   . Lymphatic filariasis   . Mitral valve regurgitation   . Pulmonary hypertension ([place])   . Tricuspid regurgitation     Past Surgical History:  Procedure Laterality Date  . SHOULDER SURGERY    . TEE WITHOUT CARDIOVERSION N/A 03/06/2017   Procedure: TRANSESOPHAGEAL ECHOCARDIOGRAM (TEE);  Surgeon: Adrian Prows, MD;  Location: [facility] ENDOSCOPY;  Service: Cardiovascular;  Laterality: N/A;    MEDICATIONS: . Cholecalciferol (VITAMIN D3) 10 MCG (400 UNIT) CAPS  . diphenhydrAMINE (BENADRYL) 50 MG tablet  . ferrous sulfate 325 (65 FE) MG tablet  . gabapentin (NEURONTIN) 300 MG capsule  . Magnesium 400 MG CAPS  . Melatonin 10 MG CAPS  . Misc Natural Products (OSTEO BI-FLEX JOINT SHIELD) TABS  . Multiple Vitamin (MULTIVITAMIN) tablet  . TURMERIC PO   No current facility-administered medications for this encounter.     Myra Gianotti, PA-C Surgical Short Stay/Anesthesiology [facility] Phone [phone] [facility] Phone [phone] 05/08/2019 3:57 PM

## 2019-05-12 ENCOUNTER — Other Ambulatory Visit (HOSPITAL_COMMUNITY)
Admission: RE | Admit: 2019-05-12 | Discharge: 2019-05-12 | Disposition: A | Discharge status: Home / Self Care | Payer: BC Managed Care – PPO | Source: Ambulatory Visit | Attending: Neurosurgery | Admitting: Neurosurgery

## 2019-05-12 ENCOUNTER — Encounter: Payer: Self-pay | Admitting: Cardiology

## 2019-05-12 DIAGNOSIS — Z20828 Contact with and (suspected) exposure to other viral communicable diseases: Secondary | ICD-10-CM | POA: Diagnosis not present

## 2019-05-12 DIAGNOSIS — Z01812 Encounter for preprocedural laboratory examination: Secondary | ICD-10-CM | POA: Diagnosis not present

## 2019-05-12 DIAGNOSIS — M5000 Cervical disc disorder with myelopathy, unspecified cervical region: Secondary | ICD-10-CM | POA: Diagnosis not present

## 2019-05-12 LAB — SARS CORONAVIRUS 2 (TAT 6-24 HRS): SARS Coronavirus 2: NEGATIVE

## 2019-05-14 ENCOUNTER — Other Ambulatory Visit: Payer: Self-pay

## 2019-05-14 ENCOUNTER — Encounter (HOSPITAL_COMMUNITY)
Admission: RE | Disposition: A | Discharge status: Home / Self Care | Payer: Self-pay | Source: Ambulatory Visit | Attending: Neurosurgery

## 2019-05-14 ENCOUNTER — Inpatient Hospital Stay (HOSPITAL_COMMUNITY): Payer: BC Managed Care – PPO | Admitting: Vascular Surgery

## 2019-05-14 ENCOUNTER — Inpatient Hospital Stay (HOSPITAL_COMMUNITY): Payer: BC Managed Care – PPO

## 2019-05-14 ENCOUNTER — Inpatient Hospital Stay (HOSPITAL_COMMUNITY)
Admission: RE | Admit: 2019-05-14 | Discharge: 2019-05-15 | DRG: 473 | Disposition: A | Discharge status: Home / Self Care | Payer: BC Managed Care – PPO | Source: Ambulatory Visit | Attending: Neurosurgery | Admitting: Neurosurgery

## 2019-05-14 ENCOUNTER — Inpatient Hospital Stay (HOSPITAL_COMMUNITY): Payer: BC Managed Care – PPO | Admitting: Certified Registered Nurse Anesthetist

## 2019-05-14 ENCOUNTER — Encounter (HOSPITAL_COMMUNITY): Payer: Self-pay

## 2019-05-14 DIAGNOSIS — I340 Nonrheumatic mitral (valve) insufficiency: Secondary | ICD-10-CM | POA: Diagnosis not present

## 2019-05-14 DIAGNOSIS — I081 Rheumatic disorders of both mitral and tricuspid valves: Secondary | ICD-10-CM | POA: Diagnosis not present

## 2019-05-14 DIAGNOSIS — Z79899 Other long term (current) drug therapy: Secondary | ICD-10-CM | POA: Diagnosis not present

## 2019-05-14 DIAGNOSIS — I272 Pulmonary hypertension, unspecified: Secondary | ICD-10-CM | POA: Diagnosis present

## 2019-05-14 DIAGNOSIS — M4802 Spinal stenosis, cervical region: Secondary | ICD-10-CM | POA: Diagnosis present

## 2019-05-14 DIAGNOSIS — M4712 Other spondylosis with myelopathy, cervical region: Secondary | ICD-10-CM | POA: Diagnosis not present

## 2019-05-14 DIAGNOSIS — G959 Disease of spinal cord, unspecified: Secondary | ICD-10-CM | POA: Diagnosis present

## 2019-05-14 DIAGNOSIS — M5000 Cervical disc disorder with myelopathy, unspecified cervical region: Secondary | ICD-10-CM | POA: Diagnosis not present

## 2019-05-14 DIAGNOSIS — Z419 Encounter for procedure for purposes other than remedying health state, unspecified: Secondary | ICD-10-CM

## 2019-05-14 DIAGNOSIS — Z20828 Contact with and (suspected) exposure to other viral communicable diseases: Secondary | ICD-10-CM | POA: Diagnosis present

## 2019-05-14 DIAGNOSIS — M4322 Fusion of spine, cervical region: Secondary | ICD-10-CM | POA: Diagnosis not present

## 2019-05-14 HISTORY — PX: POSTERIOR CERVICAL FUSION/FORAMINOTOMY: SHX5038

## 2019-05-14 LAB — ABO/RH: ABO/RH(D): O POS

## 2019-05-14 LAB — TYPE AND SCREEN
ABO/RH(D): O POS
Antibody Screen: NEGATIVE

## 2019-05-14 SURGERY — POSTERIOR CERVICAL FUSION/FORAMINOTOMY LEVEL 4
Anesthesia: General | Site: Spine Cervical

## 2019-05-14 MED ORDER — CEFAZOLIN SODIUM-DEXTROSE 2-4 GM/100ML-% IV SOLN
2.0000 g | Freq: Once | INTRAVENOUS | Status: AC
  Administered 2019-05-14: 2 g via INTRAVENOUS

## 2019-05-14 MED ORDER — FENTANYL CITRATE (PF) 250 MCG/5ML IJ SOLN
INTRAMUSCULAR | Status: AC
  Filled 2019-05-14: qty 5

## 2019-05-14 MED ORDER — SUCCINYLCHOLINE CHLORIDE 20 MG/ML IJ SOLN
INTRAMUSCULAR | Status: DC | PRN
  Administered 2019-05-14: 100 mg via INTRAVENOUS

## 2019-05-14 MED ORDER — OXYCODONE HCL 5 MG PO TABS
10.0000 mg | ORAL_TABLET | ORAL | Status: DC | PRN
  Administered 2019-05-14 – 2019-05-15 (×5): 10 mg via ORAL
  Filled 2019-05-14 (×5): qty 2

## 2019-05-14 MED ORDER — ACETAMINOPHEN 325 MG PO TABS
650.0000 mg | ORAL_TABLET | ORAL | Status: DC | PRN
  Administered 2019-05-14: 650 mg via ORAL
  Filled 2019-05-14: qty 2

## 2019-05-14 MED ORDER — ROCURONIUM BROMIDE 10 MG/ML (PF) SYRINGE
PREFILLED_SYRINGE | INTRAVENOUS | Status: AC
  Filled 2019-05-14: qty 10

## 2019-05-14 MED ORDER — LACTATED RINGERS IV SOLN
INTRAVENOUS | Status: DC
  Administered 2019-05-14 (×2): via INTRAVENOUS

## 2019-05-14 MED ORDER — ALUM & MAG HYDROXIDE-SIMETH 200-200-20 MG/5ML PO SUSP: 30.0000 mL | Freq: Four times a day (QID) | ORAL | Status: DC | PRN

## 2019-05-14 MED ORDER — LIDOCAINE 2% (20 MG/ML) 5 ML SYRINGE
INTRAMUSCULAR | Status: DC | PRN
  Administered 2019-05-14: 75 mg via INTRAVENOUS

## 2019-05-14 MED ORDER — PANTOPRAZOLE SODIUM 40 MG PO TBEC
40.0000 mg | DELAYED_RELEASE_TABLET | Freq: Every day | ORAL | Status: DC
  Administered 2019-05-14: 40 mg via ORAL
  Filled 2019-05-14: qty 1

## 2019-05-14 MED ORDER — SODIUM CHLORIDE 0.9 % IV SOLN
INTRAVENOUS | Status: DC | PRN
  Administered 2019-05-14: 11:00:00 500 mL

## 2019-05-14 MED ORDER — DEXAMETHASONE SODIUM PHOSPHATE 10 MG/ML IJ SOLN
INTRAMUSCULAR | Status: AC
  Filled 2019-05-14: qty 1

## 2019-05-14 MED ORDER — ONDANSETRON HCL 4 MG/2ML IJ SOLN
INTRAMUSCULAR | Status: AC
  Filled 2019-05-14: qty 2

## 2019-05-14 MED ORDER — LIDOCAINE 2% (20 MG/ML) 5 ML SYRINGE
INTRAMUSCULAR | Status: AC
  Filled 2019-05-14: qty 5

## 2019-05-14 MED ORDER — SODIUM CHLORIDE 0.9 % IV SOLN: 250.0000 mL | INTRAVENOUS | Status: DC

## 2019-05-14 MED ORDER — THROMBIN 20000 UNITS EX SOLR
CUTANEOUS | Status: AC
  Filled 2019-05-14: qty 20000

## 2019-05-14 MED ORDER — CEFAZOLIN SODIUM-DEXTROSE 2-4 GM/100ML-% IV SOLN
2.0000 g | Freq: Three times a day (TID) | INTRAVENOUS | Status: AC
  Administered 2019-05-14 – 2019-05-15 (×2): 2 g via INTRAVENOUS
  Filled 2019-05-14 (×2): qty 100

## 2019-05-14 MED ORDER — ROCURONIUM BROMIDE 10 MG/ML (PF) SYRINGE
PREFILLED_SYRINGE | INTRAVENOUS | Status: DC | PRN
  Administered 2019-05-14: 50 mg via INTRAVENOUS
  Administered 2019-05-14: 30 mg via INTRAVENOUS

## 2019-05-14 MED ORDER — FENTANYL CITRATE (PF) 100 MCG/2ML IJ SOLN
25.0000 ug | INTRAMUSCULAR | Status: DC | PRN
  Administered 2019-05-14: 25 ug via INTRAVENOUS

## 2019-05-14 MED ORDER — MENTHOL 3 MG MT LOZG: 1.0000 | LOZENGE | OROMUCOSAL | Status: DC | PRN

## 2019-05-14 MED ORDER — TURMERIC 500 MG PO CAPS: ORAL_CAPSULE | Freq: Every day | ORAL | Status: DC

## 2019-05-14 MED ORDER — PROPOFOL 10 MG/ML IV BOLUS
INTRAVENOUS | Status: AC
  Filled 2019-05-14: qty 20

## 2019-05-14 MED ORDER — DIPHENHYDRAMINE HCL 25 MG PO CAPS
50.0000 mg | ORAL_CAPSULE | Freq: Every evening | ORAL | Status: DC | PRN
  Filled 2019-05-14: qty 2

## 2019-05-14 MED ORDER — HYDROMORPHONE HCL 1 MG/ML IJ SOLN
0.5000 mg | INTRAMUSCULAR | Status: DC | PRN
  Administered 2019-05-14: 0.5 mg via INTRAVENOUS
  Filled 2019-05-14: qty 0.5

## 2019-05-14 MED ORDER — MIDAZOLAM HCL 2 MG/2ML IJ SOLN
INTRAMUSCULAR | Status: DC | PRN
  Administered 2019-05-14: 2 mg via INTRAVENOUS

## 2019-05-14 MED ORDER — BUPIVACAINE LIPOSOME 1.3 % IJ SUSP
20.0000 mL | Freq: Once | INTRAMUSCULAR | Status: DC
  Filled 2019-05-14: qty 20

## 2019-05-14 MED ORDER — EPHEDRINE SULFATE-NACL 50-0.9 MG/10ML-% IV SOSY
PREFILLED_SYRINGE | INTRAVENOUS | Status: DC | PRN
  Administered 2019-05-14: 15 mg via INTRAVENOUS
  Administered 2019-05-14 (×2): 10 mg via INTRAVENOUS

## 2019-05-14 MED ORDER — SODIUM CHLORIDE 0.9% FLUSH: 3.0000 mL | Freq: Two times a day (BID) | INTRAVENOUS | Status: DC

## 2019-05-14 MED ORDER — MELATONIN 10 MG PO CAPS: 10.0000 mg | ORAL_CAPSULE | Freq: Every evening | ORAL | Status: DC | PRN

## 2019-05-14 MED ORDER — CYCLOBENZAPRINE HCL 10 MG PO TABS: 10.0000 mg | ORAL_TABLET | Freq: Three times a day (TID) | ORAL | Status: DC | PRN

## 2019-05-14 MED ORDER — DEXAMETHASONE 4 MG PO TABS
4.0000 mg | ORAL_TABLET | Freq: Four times a day (QID) | ORAL | Status: DC
  Administered 2019-05-15: 4 mg via ORAL
  Filled 2019-05-14: qty 1

## 2019-05-14 MED ORDER — SODIUM CHLORIDE 0.9 % IV SOLN
INTRAVENOUS | Status: DC | PRN
  Administered 2019-05-14: 25 ug/min via INTRAVENOUS

## 2019-05-14 MED ORDER — PHENYLEPHRINE HCL (PRESSORS) 10 MG/ML IV SOLN
INTRAVENOUS | Status: AC
  Filled 2019-05-14: qty 1

## 2019-05-14 MED ORDER — FENTANYL CITRATE (PF) 100 MCG/2ML IJ SOLN
INTRAMUSCULAR | Status: AC
  Filled 2019-05-14: qty 2

## 2019-05-14 MED ORDER — LIDOCAINE-EPINEPHRINE 1 %-1:100000 IJ SOLN
INTRAMUSCULAR | Status: DC | PRN
  Administered 2019-05-14: 9 mL

## 2019-05-14 MED ORDER — ACETAMINOPHEN 10 MG/ML IV SOLN
INTRAVENOUS | Status: AC
  Filled 2019-05-14: qty 100

## 2019-05-14 MED ORDER — PHENOL 1.4 % MT LIQD: 1.0000 | OROMUCOSAL | Status: DC | PRN

## 2019-05-14 MED ORDER — BACITRACIN ZINC 500 UNIT/GM EX OINT
TOPICAL_OINTMENT | CUTANEOUS | Status: DC | PRN
  Administered 2019-05-14: 1 via TOPICAL

## 2019-05-14 MED ORDER — BUPIVACAINE LIPOSOME 1.3 % IJ SUSP
INTRAMUSCULAR | Status: DC | PRN
  Administered 2019-05-14: 20 mL

## 2019-05-14 MED ORDER — SUCCINYLCHOLINE CHLORIDE 200 MG/10ML IV SOSY
PREFILLED_SYRINGE | INTRAVENOUS | Status: AC
  Filled 2019-05-14: qty 10

## 2019-05-14 MED ORDER — SODIUM CHLORIDE 0.9% FLUSH: 3.0000 mL | INTRAVENOUS | Status: DC | PRN

## 2019-05-14 MED ORDER — ONDANSETRON HCL 4 MG/2ML IJ SOLN
INTRAMUSCULAR | Status: DC | PRN
  Administered 2019-05-14: 4 mg via INTRAVENOUS

## 2019-05-14 MED ORDER — MELATONIN 3 MG PO TABS
9.0000 mg | ORAL_TABLET | Freq: Every evening | ORAL | Status: DC | PRN
  Administered 2019-05-15: 9 mg via ORAL
  Filled 2019-05-14 (×2): qty 3

## 2019-05-14 MED ORDER — FENTANYL CITRATE (PF) 100 MCG/2ML IJ SOLN
INTRAMUSCULAR | Status: DC | PRN
  Administered 2019-05-14: 25 ug via INTRAVENOUS
  Administered 2019-05-14: 100 ug via INTRAVENOUS
  Administered 2019-05-14: 50 ug via INTRAVENOUS

## 2019-05-14 MED ORDER — ONDANSETRON HCL 4 MG PO TABS: 4.0000 mg | ORAL_TABLET | Freq: Four times a day (QID) | ORAL | Status: DC | PRN

## 2019-05-14 MED ORDER — ACETAMINOPHEN 650 MG RE SUPP
650.0000 mg | RECTAL | Status: DC | PRN
  Filled 2019-05-14: qty 1

## 2019-05-14 MED ORDER — CEFAZOLIN SODIUM-DEXTROSE 2-4 GM/100ML-% IV SOLN
INTRAVENOUS | Status: AC
  Filled 2019-05-14: qty 100

## 2019-05-14 MED ORDER — FERROUS SULFATE 325 (65 FE) MG PO TABS
325.0000 mg | ORAL_TABLET | Freq: Every day | ORAL | Status: DC
  Filled 2019-05-14: qty 1

## 2019-05-14 MED ORDER — PHENYLEPHRINE 40 MCG/ML (10ML) SYRINGE FOR IV PUSH (FOR BLOOD PRESSURE SUPPORT)
PREFILLED_SYRINGE | INTRAVENOUS | Status: DC | PRN
  Administered 2019-05-14: 40 ug via INTRAVENOUS
  Administered 2019-05-14: 200 ug via INTRAVENOUS

## 2019-05-14 MED ORDER — EPHEDRINE 5 MG/ML INJ
INTRAVENOUS | Status: AC
  Filled 2019-05-14: qty 10

## 2019-05-14 MED ORDER — DEXAMETHASONE SODIUM PHOSPHATE 10 MG/ML IJ SOLN
INTRAMUSCULAR | Status: DC | PRN
  Administered 2019-05-14: 10 mg via INTRAVENOUS

## 2019-05-14 MED ORDER — MAGNESIUM 400 MG PO CAPS: 400.0000 mg | ORAL_CAPSULE | Freq: Every day | ORAL | Status: DC

## 2019-05-14 MED ORDER — ONDANSETRON HCL 4 MG/2ML IJ SOLN
4.0000 mg | Freq: Four times a day (QID) | INTRAMUSCULAR | Status: DC | PRN
  Administered 2019-05-15: 4 mg via INTRAVENOUS
  Filled 2019-05-14: qty 2

## 2019-05-14 MED ORDER — CHOLECALCIFEROL 10 MCG (400 UNIT) PO TABS
400.0000 [IU] | ORAL_TABLET | Freq: Every day | ORAL | Status: DC
  Administered 2019-05-15: 400 [IU] via ORAL
  Filled 2019-05-14: qty 1

## 2019-05-14 MED ORDER — LIDOCAINE-EPINEPHRINE 1 %-1:100000 IJ SOLN
INTRAMUSCULAR | Status: AC
  Filled 2019-05-14: qty 1

## 2019-05-14 MED ORDER — ACETAMINOPHEN 10 MG/ML IV SOLN
INTRAVENOUS | Status: DC | PRN
  Administered 2019-05-14: 1000 mg via INTRAVENOUS

## 2019-05-14 MED ORDER — MAGNESIUM OXIDE 400 (241.3 MG) MG PO TABS
400.0000 mg | ORAL_TABLET | Freq: Every day | ORAL | Status: DC
  Administered 2019-05-15: 400 mg via ORAL
  Filled 2019-05-14: qty 1

## 2019-05-14 MED ORDER — DEXAMETHASONE SODIUM PHOSPHATE 4 MG/ML IJ SOLN
4.0000 mg | Freq: Four times a day (QID) | INTRAMUSCULAR | Status: DC
  Administered 2019-05-14 – 2019-05-15 (×2): 4 mg via INTRAVENOUS
  Filled 2019-05-14 (×2): qty 1

## 2019-05-14 MED ORDER — PROPOFOL 10 MG/ML IV BOLUS
INTRAVENOUS | Status: DC | PRN
  Administered 2019-05-14: 150 mg via INTRAVENOUS
  Administered 2019-05-14: 50 mg via INTRAVENOUS

## 2019-05-14 MED ORDER — SUGAMMADEX SODIUM 200 MG/2ML IV SOLN
INTRAVENOUS | Status: DC | PRN
  Administered 2019-05-14: 160 mg via INTRAVENOUS

## 2019-05-14 MED ORDER — PANTOPRAZOLE SODIUM 40 MG IV SOLR: 40.0000 mg | Freq: Every day | INTRAVENOUS | Status: DC

## 2019-05-14 MED ORDER — THROMBIN 5000 UNITS EX SOLR
CUTANEOUS | Status: AC
  Filled 2019-05-14: qty 5000

## 2019-05-14 MED ORDER — BACITRACIN ZINC 500 UNIT/GM EX OINT
TOPICAL_OINTMENT | CUTANEOUS | Status: AC
  Filled 2019-05-14: qty 28.35

## 2019-05-14 MED ORDER — MIDAZOLAM HCL 2 MG/2ML IJ SOLN
INTRAMUSCULAR | Status: AC
  Filled 2019-05-14: qty 2

## 2019-05-14 MED ORDER — ADULT MULTIVITAMIN W/MINERALS CH
1.0000 | ORAL_TABLET | Freq: Every day | ORAL | Status: DC
  Administered 2019-05-15: 1 via ORAL
  Filled 2019-05-14 (×2): qty 1

## 2019-05-14 MED ORDER — THROMBIN 20000 UNITS EX SOLR
CUTANEOUS | Status: DC | PRN
  Administered 2019-05-14: 20 mL via TOPICAL

## 2019-05-14 MED ORDER — GABAPENTIN 300 MG PO CAPS
300.0000 mg | ORAL_CAPSULE | Freq: Three times a day (TID) | ORAL | Status: DC
  Administered 2019-05-14 – 2019-05-15 (×3): 300 mg via ORAL
  Filled 2019-05-14 (×3): qty 1

## 2019-05-14 MED ORDER — OSTEO BI-FLEX JOINT SHIELD PO TABS: 1.0000 | ORAL_TABLET | Freq: Every day | ORAL | Status: DC

## 2019-05-14 SURGICAL SUPPLY — 70 items
BAG DECANTER FOR FLEXI CONT (MISCELLANEOUS) ×2 IMPLANT
BENZOIN TINCTURE PRP APPL 2/3 (GAUZE/BANDAGES/DRESSINGS) ×2 IMPLANT
BIT DRILL SHORT CT FOR 3.5 (BIT) ×2 IMPLANT
BLADE CLIPPER SURG (BLADE) IMPLANT
BLADE SURG 11 STRL SS (BLADE) IMPLANT
BONE VIVIGEN FORMABLE 5.4CC (Bone Implant) ×2 IMPLANT
BUR MATCHSTICK NEURO 3.0 LAGG (BURR) ×2 IMPLANT
CANISTER SUCT 3000ML PPV (MISCELLANEOUS) ×2 IMPLANT
CAP LOCKING (Cap) ×10 IMPLANT
CARTRIDGE OIL MAESTRO DRILL (MISCELLANEOUS) ×1 IMPLANT
CONT SPEC 4OZ CLIKSEAL STRL BL (MISCELLANEOUS) ×2 IMPLANT
COVER WAND RF STERILE (DRAPES) IMPLANT
DECANTER SPIKE VIAL GLASS SM (MISCELLANEOUS) ×2 IMPLANT
DERMABOND ADVANCED (GAUZE/BANDAGES/DRESSINGS) ×1
DERMABOND ADVANCED .7 DNX12 (GAUZE/BANDAGES/DRESSINGS) ×1 IMPLANT
DIFFUSER DRILL AIR PNEUMATIC (MISCELLANEOUS) ×2 IMPLANT
DRAPE C-ARM 42X72 X-RAY (DRAPES) ×4 IMPLANT
DRAPE LAPAROTOMY 100X72 PEDS (DRAPES) ×2 IMPLANT
DRAPE MICROSCOPE LEICA (MISCELLANEOUS) IMPLANT
DRAPE SURG 17X23 STRL (DRAPES) ×4 IMPLANT
DRSG OPSITE POSTOP 4X8 (GAUZE/BANDAGES/DRESSINGS) ×2 IMPLANT
DURAPREP 26ML APPLICATOR (WOUND CARE) ×2 IMPLANT
ELECT REM PT RETURN 9FT ADLT (ELECTROSURGICAL) ×2
ELECTRODE REM PT RTRN 9FT ADLT (ELECTROSURGICAL) ×1 IMPLANT
EVACUATOR 1/8 PVC DRAIN (DRAIN) ×2 IMPLANT
GAUZE 4X4 16PLY RFD (DISPOSABLE) IMPLANT
GAUZE SPONGE 4X4 12PLY STRL (GAUZE/BANDAGES/DRESSINGS) IMPLANT
GLOVE BIO SURGEON STRL SZ7 (GLOVE) ×4 IMPLANT
GLOVE BIO SURGEON STRL SZ8 (GLOVE) ×2 IMPLANT
GLOVE BIOGEL PI IND STRL 7.0 (GLOVE) ×2 IMPLANT
GLOVE BIOGEL PI IND STRL 7.5 (GLOVE) ×3 IMPLANT
GLOVE BIOGEL PI INDICATOR 7.0 (GLOVE) ×2
GLOVE BIOGEL PI INDICATOR 7.5 (GLOVE) ×3
GLOVE EXAM NITRILE XL STR (GLOVE) IMPLANT
GLOVE INDICATOR 8.5 STRL (GLOVE) ×2 IMPLANT
GLOVE SURG SS PI 7.0 STRL IVOR (GLOVE) ×8 IMPLANT
GOWN STRL REUS W/ TWL LRG LVL3 (GOWN DISPOSABLE) ×1 IMPLANT
GOWN STRL REUS W/ TWL XL LVL3 (GOWN DISPOSABLE) ×3 IMPLANT
GOWN STRL REUS W/TWL 2XL LVL3 (GOWN DISPOSABLE) IMPLANT
GOWN STRL REUS W/TWL LRG LVL3 (GOWN DISPOSABLE) ×1
GOWN STRL REUS W/TWL XL LVL3 (GOWN DISPOSABLE) ×3
IMPL QUARTEX 3.5X12MM (Neuro Prosthesis/Implant) ×10 IMPLANT
IMPLANT QUARTEX 3.5X12MM (Neuro Prosthesis/Implant) ×20 IMPLANT
KIT BASIN OR (CUSTOM PROCEDURE TRAY) ×2 IMPLANT
KIT INFUSE SMALL (Orthopedic Implant) ×2 IMPLANT
KIT TURNOVER KIT B (KITS) ×2 IMPLANT
LOCKING CAP (Cap) ×20 IMPLANT
MARKER SKIN DUAL TIP RULER LAB (MISCELLANEOUS) ×2 IMPLANT
NEEDLE HYPO 21X1.5 SAFETY (NEEDLE) ×2 IMPLANT
NEEDLE HYPO 25X1 1.5 SAFETY (NEEDLE) ×2 IMPLANT
NEEDLE SPNL 20GX3.5 QUINCKE YW (NEEDLE) ×2 IMPLANT
NS IRRIG 1000ML POUR BTL (IV SOLUTION) ×2 IMPLANT
OIL CARTRIDGE MAESTRO DRILL (MISCELLANEOUS) ×2
PACK LAMINECTOMY NEURO (CUSTOM PROCEDURE TRAY) ×2 IMPLANT
PAD ARMBOARD 7.5X6 YLW CONV (MISCELLANEOUS) ×6 IMPLANT
PIN MAYFIELD SKULL DISP (PIN) ×2 IMPLANT
ROD CAPITAL CVD 3.5X80 (Rod) ×4 IMPLANT
RUBBERBAND STERILE (MISCELLANEOUS) IMPLANT
SPONGE LAP 4X18 RFD (DISPOSABLE) IMPLANT
SPONGE SURGIFOAM ABS GEL 100 (HEMOSTASIS) ×2 IMPLANT
STRIP CLOSURE SKIN 1/2X4 (GAUZE/BANDAGES/DRESSINGS) ×2 IMPLANT
SUT VIC AB 0 CT1 18XCR BRD8 (SUTURE) ×1 IMPLANT
SUT VIC AB 0 CT1 8-18 (SUTURE) ×1
SUT VIC AB 2-0 CT1 18 (SUTURE) ×4 IMPLANT
SUT VIC AB 4-0 PS2 27 (SUTURE) ×2 IMPLANT
SYR 20ML LL LF (SYRINGE) ×2 IMPLANT
TOWEL GREEN STERILE (TOWEL DISPOSABLE) ×2 IMPLANT
TOWEL GREEN STERILE FF (TOWEL DISPOSABLE) ×2 IMPLANT
TRAY FOLEY MTR SLVR 16FR STAT (SET/KITS/TRAYS/PACK) IMPLANT
WATER STERILE IRR 1000ML POUR (IV SOLUTION) ×2 IMPLANT

## 2019-05-14 NOTE — Anesthesia Procedure Notes (Signed)
Procedure Name: Intubation Date/Time: 05/14/2019 9:50 AM Performed by: Barrington Ellison, CRNA Pre-anesthesia Checklist: Patient identified, Emergency Drugs available, Suction available and Patient being monitored Patient Re-evaluated:Patient Re-evaluated prior to induction Oxygen Delivery Method: Circle System Utilized Preoxygenation: Pre-oxygenation with 100% oxygen Induction Type: IV induction Ventilation: Mask ventilation without difficulty Laryngoscope Size: Mac and 4 Grade View: Grade I Tube type: Oral Tube size: 7.5 mm Number of attempts: 1 Airway Equipment and Method: Stylet and Oral airway Placement Confirmation: ETT inserted through vocal cords under direct vision,  positive ETCO2 and breath sounds checked- equal and bilateral Secured at: 23 cm Tube secured with: Tape Dental Injury: Teeth and Oropharynx as per pre-operative assessment

## 2019-05-14 NOTE — Anesthesia Procedure Notes (Signed)
Arterial Line Insertion Start/End9/10/2018 9:04 AM, 05/14/2019 9:08 AM Performed by: Barrington Ellison, CRNA, CRNA  Patient location: Pre-op. Preanesthetic checklist: patient identified, risks and benefits discussed and pre-op evaluation Lidocaine 1% used for infiltration Left, brachial was placed Catheter size: 20 G Hand hygiene performed  and maximum sterile barriers used  Allen's test indicative of satisfactory collateral circulation Attempts: 1 Procedure performed without using ultrasound guided technique. Following insertion, dressing applied and Biopatch. Post procedure assessment: normal  Patient tolerated the procedure well with no immediate complications.

## 2019-05-14 NOTE — Anesthesia Postprocedure Evaluation (Signed)
Anesthesia Post Note  Patient: Keith Fisher  Procedure(s) Performed: Posterior Cervical Fusion and decompression with lateral mass fixation - Cervical Three-Four,Cervical Four-Five, Cervical Five-Six, Cervical Six-Seven. (N/A Spine Cervical)     Patient location during evaluation: PACU Anesthesia Type: General Level of consciousness: awake Pain management: pain level controlled Respiratory status: spontaneous breathing Cardiovascular status: stable Postop Assessment: no headache Anesthetic complications: no    Last Vitals:  Vitals:   05/14/19 0757 05/14/19 1220  BP: (!) 147/55 (!) 119/58  Pulse: (!) 57 (!) 58  Resp:  14  Temp: 36.9 C 36.4 C  SpO2: 96% 95%    Last Pain:  Vitals:   05/14/19 0757  TempSrc: Oral  PainSc: 0-No pain                 Makelle Marrone

## 2019-05-14 NOTE — Op Note (Signed)
Preoperative diagnosis: Cervical spondylitic myelopathy from severe cervical stenosis with cord compression at C3-4 C4-5 C5-6 C6-7 with signal change within the cord at C3-4 and C6-7  Postoperative diagnosis: Same  Procedure: Decompressive cervical laminectomy C3-4 C4-5 C5-6 C6-7 and posterior cervical fusion with lateral mass screws from C3-C7 and posterior lateral arthrodesis C3-C7  Surgeon: Dominica Severin Noland Pizano  Assistant: Nash Shearer  Anesthesia: General  EBL: Minimal  HPI: 73 year old gentleman is a longstanding cervical myelopathy with stenosis from C3-C7 patient failed all forms conservative treatment work-up revealed cord compression signal change within his cord and severe stenosis.  So due to his failed conservative treatment imaging findings and progression of clinical syndrome I recommended posterior cervical decompressive laminectomy and fusion from C3-C7.  I extensively went over the risks and benefits of the operation with the patient as well as perioperative course expectations of outcome and alternatives of surgery and he understood and agreed to proceed forward.  Operative procedure: Patient was brought into the OR was induced under general anesthesia positioned prone in pins the backside of his neck was prepped and draped in routine sterile fashion a midline incision was made after infiltration of 10 cc lidocaine with epi and Bovie the car was used to take down the subcu tissue and subperiosteal dissection was carried lamina from C2 down to the top of T1.  I exposed the lateral masses from C3-C7 confirmed by intraoperative x-ray.  I then placed lateral mass screws from the pilot holes drilled in the inferomedial quadrant directing superior laterally I drilled 12 mm holes probed him tapped him on the neocortex probed again and placed 1012 mm screws.  All screws had excellent purchase.  At this point I began the decompressive laminectomy remove the spinous processes at C3-4-5 and 6 performed  central decompression by 1 by marching up the gutters starting at the inferior aspect of 6 marching all the way up through the lamina of 3.  There was severe stenosis both the C6-7 to space at the C5-6 disc space and marketed hourglass compression of the spinal cord at C3-4.  All this was unroofed with performing laminotomies margin of the gutters and then unroofing the central lamina to minimize any manipulation of the spinal cord.  After adequate decompression been achieved from C3 down to C7 was copiously irrigated meticulous hemostasis was maintained I aggressively decorticated the facet joints and lateral masses from C3-C7 packed locally harvested autograft mixed with vivigen and BMP posterior laterally.  I then sized up to 8 mm rods anchored and placed place Gelfoam in the dura and closed the wound in layers after placement of a medium Hemovac drain with interrupted Vicryl and a running 4 subcuticular and skin dermal benzoin Steri-Strips and a sterile dressing was applied patient recovery in stable addition.  At the end the case all needle count sponge counts were correct.

## 2019-05-14 NOTE — H&P (Signed)
Keith Fisher is an 73 y.o. male.   Chief Complaint: Neck pain numbness tingling weakness in his hands difficulty walking HPI: 73 year old gentleman with longstanding cervical myelopathy and cervical stenosis.  Presents with worsening over the last several months.  Work-up has revealed progressive stenosis spinal cord compression and signal change within his cord.  Due to patient progression of clinical syndrome imaging findings and failed conservative treatment I recommended decompressive cervical laminectomy and fusion.  I extensively went over the risks and benefits of the operation with him as well as perioperative course expectations of outcome and alternatives of surgery and he understood and agreed to proceed forward.  Past Medical History:  Diagnosis Date  . Dyspnea   . Lymphatic filariasis   . Mitral valve regurgitation   . Pulmonary hypertension ([place])   . Tricuspid regurgitation     Past Surgical History:  Procedure Laterality Date  . SHOULDER SURGERY    . TEE WITHOUT CARDIOVERSION N/A 03/06/2017   Procedure: TRANSESOPHAGEAL ECHOCARDIOGRAM (TEE);  Surgeon: Adrian Prows, MD;  Location: [facility];  Service: Cardiovascular;  Laterality: N/A;    History reviewed. No pertinent family history. Social History:  reports that he has never smoked. He has never used smokeless tobacco. He reports that he does not drink alcohol or use drugs.  Allergies: No Known Allergies  Medications Prior to Admission  Medication Sig Dispense Refill  . Cholecalciferol (VITAMIN D3) 10 MCG (400 UNIT) CAPS Take 400 Units by mouth daily.    . diphenhydrAMINE (BENADRYL) 50 MG tablet Take 50 mg by mouth at bedtime as needed for sleep.    . ferrous sulfate 325 (65 FE) MG tablet Take 325 mg by mouth daily.    Marland Kitchen gabapentin (NEURONTIN) 300 MG capsule Take 1 capsule (300 mg total) by mouth 3 (three) times daily. 90 capsule 1  . Magnesium 400 MG CAPS Take 400 mg by mouth daily.    . Melatonin 10 MG CAPS Take 10 mg  by mouth at bedtime as needed (sleep).    . Misc Natural Products (OSTEO BI-FLEX JOINT SHIELD) TABS Take 1 tablet by mouth daily.    . Multiple Vitamin (MULTIVITAMIN) tablet Take 1 tablet by mouth daily.    . TURMERIC PO Take 1 tablet by mouth daily.       Results for orders placed or performed during the hospital encounter of 05/14/19 (from the past 48 hour(s))  Type and screen [place]     Status: None (Preliminary result)   Collection Time: 05/14/19  8:15 AM  Result Value Ref Range   ABO/RH(D) O POS    Antibody Screen      NEG Performed at [facility] [address] [place], [place] [address]    Sample Expiration PENDING    No results found.  Review of Systems  Musculoskeletal: Positive for neck pain.  Neurological: Positive for tingling, sensory change and focal weakness.    Blood pressure (!) 147/55, pulse (!) 57, temperature 98.4 F (36.9 C), temperature source Oral, height 5\' 10"  (1.778 m), weight 79.4 kg, SpO2 96 %. Physical Exam  Constitutional: He is oriented to person, place, and time. He appears well-developed.  HENT:  Head: Normocephalic.  Eyes: Pupils are equal, round, and reactive to light.  Neck: Normal range of motion.  Respiratory: Effort normal.  GI: Soft.  Neurological: He is alert and oriented to person, place, and time. He has normal strength. GCS eye subscore is 4. GCS verbal subscore is  5. GCS motor subscore is 6.  Strength is 5/5 deltoid bicep tricep and hand intrinsics are all 4+ out of 5 lower extremities 5 out of 5  Skin: Skin is warm and dry.     Assessment/Plan 73 year old presents for posterior cervical decompression fusion C3-C7  Eulogia Dismore P, MD 05/14/2019, 9:29 AM

## 2019-05-14 NOTE — Anesthesia Postprocedure Evaluation (Signed)
Anesthesia Post Note  Patient: Keith Fisher  Procedure(s) Performed: Posterior Cervical Fusion and decompression with lateral mass fixation - Cervical Three-Four,Cervical Four-Five, Cervical Five-Six, Cervical Six-Seven. (N/A Spine Cervical)     Patient location during evaluation: PACU Anesthesia Type: General Level of consciousness: awake Pain management: pain level controlled Vital Signs Assessment: post-procedure vital signs reviewed and stable Respiratory status: spontaneous breathing Cardiovascular status: stable Postop Assessment: no headache    Last Vitals:  Vitals:   05/14/19 0757 05/14/19 1220  BP: (!) 147/55 (!) 119/58  Pulse: (!) 57   Temp: 36.9 C 36.4 C  SpO2: 96%     Last Pain:  Vitals:   05/14/19 0757  TempSrc: Oral  PainSc: 0-No pain                 Cristle Jared

## 2019-05-14 NOTE — Transfer of Care (Signed)
Immediate Anesthesia Transfer of Care Note  Patient: Keith Fisher  Procedure(s) Performed: Posterior Cervical Fusion and decompression with lateral mass fixation - Cervical Three-Four,Cervical Four-Five, Cervical Five-Six, Cervical Six-Seven. (N/A Spine Cervical)  Patient Location: PACU  Anesthesia Type:General  Level of Consciousness: drowsy  Airway & Oxygen Therapy: Patient Spontanous Breathing  Post-op Assessment: Report given to RN  Post vital signs: Reviewed and stable  Last Vitals:  Vitals Value Taken Time  BP 119/58 05/14/19 1222  Temp    Pulse 67 05/14/19 1223  Resp 19 05/14/19 1223  SpO2 93 % 05/14/19 1223  Vitals shown include unvalidated device data.  Last Pain:  Vitals:   05/14/19 0757  TempSrc: Oral  PainSc: 0-No pain      Patients Stated Pain Goal: 3 (01/74/94 4967)  Complications: No apparent anesthesia complications

## 2019-05-14 NOTE — Progress Notes (Signed)
Physical Therapy Evaluation Patient Details Name: Keith Fisher MRN: [id number] DOB: 12/14/1945 Today's Date: 05/14/2019   History of Present Illness  Pt is a 73 y/o male s/p C3-7 posterior cervical fusion. PMH includes mitral valve regurgitation and pulm HTN.   Clinical Impression  Patient is s/p above surgery resulting in the deficits listed below (see PT Problem List). Pt unsteady in standing with posterior lean and required mod A for steadying assist. Unable to side step at EOB. Pt also with grogginess throughout session. Educated pt and pt's son about cervical precautions following surgery. Will need to ensure safe mobility progression prior to return home. Patient will benefit from skilled PT to increase their independence and safety with mobility (while adhering to their precautions) to allow discharge to the venue listed below.     Follow Up Recommendations Home health PT;Supervision/Assistance - 24 hour(vs SNF pending progress)    Equipment Recommendations  Rolling walker with 5" wheels    Recommendations for Other Services       Precautions / Restrictions Precautions Precautions: Cervical Precaution Booklet Issued: Yes (comment) Precaution Comments: reviewed precaution handout with pt and son.  Required Braces or Orthoses: Cervical Brace Cervical Brace: Hard collar Restrictions Weight Bearing Restrictions: No      Mobility  Bed Mobility Overal bed mobility: Needs Assistance Bed Mobility: Rolling;Sidelying to Sit;Sit to Sidelying Rolling: Min assist Sidelying to sit: Min assist     Sit to sidelying: Min assist General bed mobility comments: Min A for assist with rolling and trunk elevation to come to sitting. Cues for sequencing. Min A for LE assist for return to sidelying.   Transfers Overall transfer level: Needs assistance Equipment used: Rolling walker (2 wheeled) Transfers: Sit to/from Stand Sit to Stand: Mod assist;+2 safety/equipment         General  transfer comment: Mod A for lift assist and steadying to stand. Pt's son on one side for safety. Pt with increased sway noted and posterior LOB. Attempted to take side steps at EOB, however, pt unable at this time without LOB.   Ambulation/Gait                Stairs            Wheelchair Mobility    Modified Rankin (Stroke Patients Only)       Balance Overall balance assessment: Needs assistance Sitting-balance support: Feet supported;Bilateral upper extremity supported Sitting balance-Leahy Scale: Poor Sitting balance - Comments: min to min guard A for balance sitting EOB    Standing balance support: Bilateral upper extremity supported;During functional activity Standing balance-Leahy Scale: Poor Standing balance comment: Reliant on BUE and external support to maintain standing balance.                              Pertinent Vitals/Pain Pain Assessment: Faces Faces Pain Scale: Hurts even more Pain Location: neck Pain Descriptors / Indicators: Aching;Operative site guarding Pain Intervention(s): Limited activity within patient's tolerance;Monitored during session;Repositioned    Home Living Family/patient expects to be discharged to:: Private residence Living Arrangements: Spouse/significant other Available Help at Discharge: Family;Available 24 hours/day Type of Home: House Home Access: Stairs to enter Entrance Stairs-Rails: None Entrance Stairs-Number of Steps: 1 Home Layout: One level Home Equipment: Other (comment);Toilet riser(walking stick)      Prior Function Level of Independence: Independent with assistive device(s)         Comments: Pt's son reports pt was ambulating with walking  stick.      Hand Dominance        Extremity/Trunk Assessment   Upper Extremity Assessment Upper Extremity Assessment: Defer to OT evaluation    Lower Extremity Assessment Lower Extremity Assessment: RLE deficits/detail;Generalized weakness RLE  Deficits / Details: RLE swelling noted, however, pt's son reports this as baseline.     Cervical / Trunk Assessment Cervical / Trunk Assessment: Other exceptions Cervical / Trunk Exceptions: s/p cervical surgery   Communication   Communication: Prefers language other than English(Pt understands some english; son helpful for interpreting)  Cognition Arousal/Alertness: Suspect due to medications Behavior During Therapy: WFL for tasks assessed/performed Overall Cognitive Status: Within Functional Limits for tasks assessed                                 General Comments: Pt groggy following surgery, however, easily arousable.       General Comments General comments (skin integrity, edema, etc.): Pt's son present throughout session.     Exercises     Assessment/Plan    PT Assessment Patient needs continued PT services  PT Problem List Decreased strength;Decreased balance;Decreased activity tolerance;Decreased mobility;Decreased knowledge of use of DME;Decreased knowledge of precautions;Pain       PT Treatment Interventions DME instruction;Gait training;Stair training;Functional mobility training;Therapeutic activities;Therapeutic exercise;Balance training;Patient/family education    PT Goals (Current goals can be found in the Care Plan section)  Acute Rehab PT Goals Patient Stated Goal: to decrease pain  PT Goal Formulation: With patient Time For Goal Achievement: 05/28/19 Potential to Achieve Goals: Fair    Frequency Min 5X/week   Barriers to discharge        Co-evaluation               AM-PAC PT "6 Clicks" Mobility  Outcome Measure Help needed turning from your back to your side while in a flat bed without using bedrails?: A Little Help needed moving from lying on your back to sitting on the side of a flat bed without using bedrails?: A Little Help needed moving to and from a bed to a chair (including a wheelchair)?: A Lot Help needed standing up  from a chair using your arms (e.g., wheelchair or bedside chair)?: A Lot Help needed to walk in hospital room?: Total Help needed climbing 3-5 steps with a railing? : Total 6 Click Score: 12    End of Session Equipment Utilized During Treatment: Gait belt;Cervical collar Activity Tolerance: Patient limited by lethargy Patient left: in bed;with call bell/phone within reach;with family/visitor present Nurse Communication: Mobility status PT Visit Diagnosis: Unsteadiness on feet (R26.81);Muscle weakness (generalized) (M62.81);Pain Pain - part of body: (neck)    Time: 0938-1829 PT Time Calculation (min) (ACUTE ONLY): 21 min   Charges:   PT Evaluation $PT Eval Low Complexity: [place], PT, DPT  Acute Rehabilitation Services  Pager: [phone] Office: [phone]   Rudean Hitt 05/14/2019, 4:34 PM

## 2019-05-15 MED ORDER — METHOCARBAMOL 500 MG PO TABS: 500.0000 mg | ORAL_TABLET | Freq: Four times a day (QID) | ORAL | 0 refills | Status: DC

## 2019-05-15 MED ORDER — HYDROCODONE-ACETAMINOPHEN 5-325 MG PO TABS: 1.0000 | ORAL_TABLET | ORAL | 0 refills | Status: AC | PRN

## 2019-05-15 MED ORDER — HYDROXYZINE HCL 50 MG/ML IM SOLN
50.0000 mg | Freq: Four times a day (QID) | INTRAMUSCULAR | Status: DC | PRN
  Administered 2019-05-15: 50 mg via INTRAMUSCULAR
  Filled 2019-05-15: qty 1

## 2019-05-15 NOTE — Progress Notes (Signed)
Physical Therapy Treatment Patient Details Name: Keith ArrowMohan S Beitler MRN: [id number] DOB: 08/18/1946 Today's Date: 05/15/2019    History of Present Illness Pt is a 73 y/o male s/p C3-7 posterior cervical fusion. PMH includes mitral valve regurgitation and pulm HTN.     PT Comments    Pt progressing towards physical therapy goals. Continues to demonstrate decreased safety awareness at times, however wife very in tune to recommendations and correcting pt throughout session. They were educated on precautions, activity progression, brace adjustments, and car transfer. Pt and wife anticipate d/c home today. Will continue to follow.     Follow Up Recommendations  Home health PT;Supervision/Assistance - 24 hour(vs SNF pending progress)     Equipment Recommendations  Rolling walker with 5" wheels    Recommendations for Other Services       Precautions / Restrictions Precautions Precautions: Cervical Precaution Booklet Issued: Yes (comment) Precaution Comments: Reviewed precautions verbally with pt and wife. He was cued for precautions during functional mobility.  Required Braces or Orthoses: Cervical Brace Cervical Brace: Hard collar Restrictions Weight Bearing Restrictions: No    Mobility  Bed Mobility Overal bed mobility: Needs Assistance Bed Mobility: Rolling;Sidelying to Sit;Sit to Sidelying Rolling: Supervision Sidelying to sit: Supervision     Sit to sidelying: Supervision General bed mobility comments: Supervision and VC's for proper log roll technique.   Transfers Overall transfer level: Needs assistance Equipment used: Rolling walker (2 wheeled) Transfers: Sit to/from Stand Sit to Stand: Min guard         General transfer comment: VC's for hand placement on seated surface for safety.  Ambulation/Gait Ambulation/Gait assistance: Min guard Gait Distance (Feet): 400 Feet Assistive device: Rolling walker (2 wheeled) Gait Pattern/deviations: Step-through pattern;Decreased  stride length;Trunk flexed Gait velocity: Decreased Gait velocity interpretation: <1.31 ft/sec, indicative of household ambulator General Gait Details: VC's for sequencing and general safety at times. At one point pt attempting to ambulate backwards with walker turned around and did not understand how this was a safety issue. States "I just want to practice".   Stairs Stairs: (Pt declined stair training)           Wheelchair Mobility    Modified Rankin (Stroke Patients Only)       Balance Overall balance assessment: Needs assistance Sitting-balance support: Feet supported;Bilateral upper extremity supported Sitting balance-Leahy Scale: Poor Sitting balance - Comments: min to min guard A for balance sitting EOB    Standing balance support: Bilateral upper extremity supported;During functional activity Standing balance-Leahy Scale: Poor Standing balance comment: Reliant on BUE and external support to maintain standing balance.                             Cognition Arousal/Alertness: Awake/alert Behavior During Therapy: WFL for tasks assessed/performed Overall Cognitive Status: Within Functional Limits for tasks assessed                                 General Comments: Pt understands precautions, but requires mod cues to adhere to them       Exercises      General Comments        Pertinent Vitals/Pain Pain Assessment: Faces Faces Pain Scale: Hurts a little bit Pain Location: neck Pain Descriptors / Indicators: Operative site guarding Pain Intervention(s): Monitored during session    Home Living Family/patient expects to be discharged to:: Private residence Living Arrangements: Spouse/significant other  Available Help at Discharge: Family;Available 24 hours/day Type of Home: House Home Access: Stairs to enter Entrance Stairs-Rails: None Home Layout: One level Home Equipment: Other (comment);Toilet riser(walking stick )      Prior  Function Level of Independence: Independent with assistive device(s)      Comments: Pt's son reports pt was ambulating with walking stick.  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     PT Goals (current goals can now be found in the care plan section) Acute Rehab PT Goals Patient Stated Goal: to decrease pain  PT Goal Formulation: With patient Time For Goal Achievement: 05/28/19 Potential to Achieve Goals: Fair Progress towards PT goals: Progressing toward goals    Frequency    Min 5X/week      PT Plan Current plan remains appropriate    Co-evaluation              AM-PAC PT "6 Clicks" Mobility   Outcome Measure  Help needed turning from your back to your side while in a flat bed without using bedrails?: None Help needed moving from lying on your back to sitting on the side of a flat bed without using bedrails?: A Little Help needed moving to and from a bed to a chair (including a wheelchair)?: A Little Help needed standing up from a chair using your arms (e.g., wheelchair or bedside chair)?: A Little Help needed to walk in hospital room?: A Little Help needed climbing 3-5 steps with a railing? : A Little 6 Click Score: 19    End of Session Equipment Utilized During Treatment: Gait belt;Cervical collar Activity Tolerance: Patient tolerated treatment well Patient left: with call bell/phone within reach;with family/visitor present(Sitting EOB awaiting OT) Nurse Communication: Mobility status PT Visit Diagnosis: Unsteadiness on feet (R26.81);Muscle weakness (generalized) (M62.81);Pain Pain - part of body: (neck)     Time: 8841-6606 PT Time Calculation (min) (ACUTE ONLY): 36 min  Charges:  $Gait Training: 23-37 mins                     Rolinda Roan, PT, DPT Acute Rehabilitation Services Pager: [phone] Office: [phone]    Thelma Comp 05/15/2019, 11:17 AM

## 2019-05-15 NOTE — Progress Notes (Signed)
Occupational Therapy Evaluation (late entry)  Pt presents to OT with the below listed diagnosis and deficits.  He requires mod cues to adhere to cervical precautions.  He and wife instructed re: safety with ADLs, donning/doffing cervical collar, and precautions.  They were able to verbalize understanding - handout provided.  Wife very supportive. Pt reports he was fully independent and active PTA, and hopes to return to PLOF.  All further OT needs can be met by [facility].  Recommend 3in1    05/15/19 0900  OT Visit Information  Last OT Received On 05/15/19  Assistance Needed +1  History of Present Illness Pt is a 73 y/o male s/p C3-7 posterior cervical fusion. PMH includes mitral valve regurgitation and pulm HTN.   Precautions  Precautions Cervical  Precaution Booklet Issued Yes (comment)  Precaution Comments reviewed precaution handout with pt and son.   Required Braces or Orthoses Cervical Brace  Cervical Brace Hard collar  Home Living  Family/patient expects to be discharged to: Private residence  Living Arrangements Spouse/significant other  Available Help at Discharge Family;Available 24 hours/day  Type of Home House  Home Access Stairs to enter  Entrance Stairs-Number of Steps 1  Entrance Stairs-Rails None  Home Layout One level  Bathroom Therapist, music Other (comment);Toilet riser (walking stick )  Prior Function  Level of Independence Independent with assistive device(s)  Comments Pt's son reports pt was ambulating with walking stick.  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 Prefers language other than English  Pain Assessment  Pain Assessment Faces  Faces Pain Scale 6  Pain Location neck  Pain Descriptors / Indicators Aching;Operative site guarding  Pain Intervention(s) Limited activity within patient's tolerance;Monitored during session;Repositioned   Cognition  Arousal/Alertness Suspect due to medications  Behavior During Therapy WFL for tasks assessed/performed  Overall Cognitive Status Within Functional Limits for tasks assessed  General Comments Pt understands precautions, but requires mod cues to adhere to them   Upper Extremity Assessment  Upper Extremity Assessment RUE deficits/detail;LUE deficits/detail  RUE Deficits / Details impaired sensation bil. hands   RUE Coordination decreased fine motor  LUE Deficits / Details impaired sensation bil. hands   LUE Coordination decreased fine motor  Lower Extremity Assessment  Lower Extremity Assessment Defer to PT evaluation  Cervical / Trunk Assessment  Cervical / Trunk Assessment Other exceptions  Cervical / Trunk Exceptions s/p cervical surgery   ADL  Overall ADL's  Needs assistance/impaired  Eating/Feeding Modified independent;Bed level;Sitting  Grooming Details (indicate cue type and reason) reviewed correct technique for oral care and shaving   Upper Body Bathing Supervision/ safety;Sitting  Lower Body Bathing Min guard;Sit to/from stand  Upper Body Dressing  Set up;Sitting  Lower Body Dressing Min guard;Sit to/from stand  Lower Body Dressing Details (indicate cue type and reason) pt spontaneously bends forward despite cues   Toilet Transfer Min guard;Ambulation;Comfort height toilet;Grab bars;RW  Actuary Min guard;Sit to/from stand  Functional mobility during ADLs Min guard  General ADL Comments Pt requires mod cues to adhere to cervical precautions.  He is aware of them, but spontaneously bends forward and turns without realizing he is breaking precautions.  Reinforced need to wear collar at all times, except showering, with he and wife.  wife able to don/doff cervical collar   Bed Mobility  Overal bed mobility Needs Assistance  Bed Mobility Rolling;Sit to Sidelying  Rolling  Supervision  Sit to sidelying Supervision  General bed  mobility comments verbal cues for proper technique  Transfers  Overall transfer level Needs assistance  Equipment used Rolling walker (2 wheeled)  Transfers Sit to/from Stand  Sit to Stand Min guard  Balance  Overall balance assessment Needs assistance  Sitting-balance support Feet supported  Sitting balance-Leahy Scale Good  Standing balance support Single extremity supported  Standing balance-Leahy Scale Poor  Standing balance comment reliant on UE support   OT - End of Session  Equipment Utilized During Treatment Cervical collar  Activity Tolerance Patient tolerated treatment well  Patient left in bed;with call bell/phone within reach;with family/visitor present  Nurse Communication Mobility status  OT Assessment  OT Recommendation/Assessment All further OT needs can be met in the next venue of care  OT Visit Diagnosis Unsteadiness on feet (R26.81);Pain  Pain - part of body  (neck )  OT Problem List Decreased strength;Decreased activity tolerance;Impaired balance (sitting and/or standing);Decreased safety awareness;Decreased knowledge of use of DME or AE;Decreased knowledge of precautions;Pain;Impaired UE functional use  AM-PAC OT "6 Clicks" Daily Activity Outcome Measure (Version 2)  Help from another person eating meals? 4  Help from another person taking care of personal grooming? 3  Help from another person toileting, which includes using toliet, bedpan, or urinal? 3  Help from another person bathing (including washing, rinsing, drying)? 3  Help from another person to put on and taking off regular upper body clothing? 3  Help from another person to put on and taking off regular lower body clothing? 3  6 Click Score 19  OT Recommendation  Follow Up Recommendations Home health OT;Supervision/Assistance - 24 hour  OT Equipment 3 in 1 bedside commode  Individuals Consulted  Consulted and Agree with Results and Recommendations Patient;Family member/caregiver  Family Member  Consulted wife  Acute Rehab OT Goals  Patient Stated Goal to string tennis rackets   OT Time Calculation  OT Start Time (ACUTE ONLY) 0857  OT Stop Time (ACUTE ONLY) 0924  OT Time Calculation (min) 27 min  OT General Charges  $OT Visit 1 Visit  OT Evaluation  $OT Eval Moderate Complexity 1 Mod  OT Treatments  $Self Care/Home Management  8-22 mins  Written Expression  Dominant Hand Right  Lucille Passy, OTR/L Acute Rehabilitation Services Pager [phone] Office [phone]

## 2019-05-15 NOTE — Discharge Summary (Signed)
Physician Discharge Summary  Patient ID: Keith Fisher MRN: [id number] DOB/AGE: 71/12/1945 71 y.o.  Admit date: 05/14/2019 Discharge date: 05/15/2019  Admission Diagnoses:  Cervical spondylitic myelopathy from severe cervical stenosis with cord compression at C3-4 C4-5 C5-6 C6-7 with signal change within the cord at C3-4 and C6-7    Discharge Diagnoses: same   Discharged Condition: good  Hospital Course: The patient was admitted on 05/14/2019 and taken to the operating room where the patient underwentposterior cervical fusion with decompression C3-C7. The patient tolerated the procedure well and was taken to the recovery room and then to the floor in stable condition. The hospital course was routine. There were no complications. The wound remained clean dry and intact. Pt had appropriate neck soreness. No complaints of arm pain or new N/T/W. The patient remained afebrile with stable vital signs, and tolerated a regular diet. The patient continued to increase activities, and pain was well controlled with oral pain medications.   Consults: None  Significant Diagnostic Studies:  Results for orders placed or performed during the hospital encounter of 05/14/19  Type and screen [facility]  Result Value Ref Range   ABO/RH(D) O POS    Antibody Screen NEG    Sample Expiration      05/17/2019,2359 Performed at [facility], [address] [place], [place] [address]   ABO/Rh  Result Value Ref Range   ABO/RH(D)      O POS Performed at [facility], [address] [place], [place] [address]     Dg Cervical Spine 2-3 Views  Result Date: 05/14/2019 CLINICAL DATA:  Surgical posterior fusion extending from C3-C7. EXAM: CERVICAL SPINE - 2-3 VIEW FLUOROSCOPY TIME:  8 seconds. COMPARISON:  MRI of April 13, 2019. FINDINGS: Three intraoperative fluoroscopic images were obtained of the cervical spine. These demonstrate the patient be status post surgical posterior fusion of  C3-4, C4-5, C5-6 and C6-7 with bilateral intrapedicular screw placement. Good alignment of vertebral bodies is noted. IMPRESSION: Fluoroscopic guidance provided during surgical posterior fusion extending from C3-4 to C6-7. Electronically Signed   By: Lupita RaiderJames  Green Jr M.D.   On: 05/14/2019 14:39   Dg C-arm 1-60 Min  Result Date: 05/14/2019 CLINICAL DATA:  Cervical fusion EXAM: DG C-ARM 1-60 MIN FLUOROSCOPY TIME:  Fluoroscopy Time:  8 seconds Radiation Exposure Index (if provided by the fluoroscopic device): Not provided Number of Acquired Spot Images: 3 COMPARISON:  04/13/2019 FINDINGS: C-arm fluoroscopic images were obtained intraoperatively and submitted for post operative interpretation. Posterior spinal fusion spanning from C3-C7 without obvious complication. Please see the performing provider's procedural report for the fluoroscopy time utilized. IMPRESSION: As above. Electronically Signed   By: Duanne GuessNicholas  Plundo M.D.   On: 05/14/2019 14:41    Antibiotics:  Anti-infectives (From admission, onward)   Start     Dose/Rate Route Frequency Ordered Stop   05/14/19 1800  ceFAZolin (ANCEF) IVPB 2g/100 mL premix     2 g 200 mL/hr over 30 Minutes Intravenous Every 8 hours 05/14/19 1450 05/15/19 0103   05/14/19 1036  bacitracin 50,000 Units in sodium chloride 0.9 % 500 mL irrigation  Status:  Discontinued       As needed 05/14/19 1036 05/14/19 1213   05/14/19 0932  ceFAZolin (ANCEF) 2-4 GM/100ML-% IVPB    Note to Pharmacy: Larose HiresForte, Lindsi   : cabinet override      05/14/19 0932 05/14/19 0951   05/14/19 0930  ceFAZolin (ANCEF) IVPB 2g/100 mL premix  2 g 200 mL/hr over 30 Minutes Intravenous  Once 05/14/19 0930 05/14/19 0951      Discharge Exam: Blood pressure (!) 142/69, pulse 64, temperature 98.1 F (36.7 C), temperature source Oral, resp. rate 16, height 5\' 10"  (1.778 m), weight 79.4 kg, SpO2 99 %. Neurologic: Grossly normal Ambulating and voiding well, incision cdi  Discharge Medications:    Allergies as of 05/15/2019   No Known Allergies     Medication List    TAKE these medications   diphenhydrAMINE 50 MG tablet Commonly known as: BENADRYL Take 50 mg by mouth at bedtime as needed for sleep.   ferrous sulfate 325 (65 FE) MG tablet Take 325 mg by mouth daily.   gabapentin 300 MG capsule Commonly known as: Neurontin Take 1 capsule (300 mg total) by mouth 3 (three) times daily.   HYDROcodone-acetaminophen 5-325 MG tablet Commonly known as: NORCO/VICODIN Take 1 tablet by mouth every 4 (four) hours as needed for moderate pain.   Magnesium 400 MG Caps Take 400 mg by mouth daily.   Melatonin 10 MG Caps Take 10 mg by mouth at bedtime as needed (sleep).   methocarbamol 500 MG tablet Commonly known as: Robaxin Take 1 tablet (500 mg total) by mouth 4 (four) times daily.   multivitamin tablet Take 1 tablet by mouth daily.   Osteo Bi-Flex Joint Shield Tabs Take 1 tablet by mouth daily.   TURMERIC PO Take 1 tablet by mouth daily.   Vitamin D3 10 MCG (400 UNIT) Caps Take 400 Units by mouth daily.            Durable Medical Equipment  (From admission, onward)         Start     Ordered   05/15/19 1016  For home use only DME 3 n 1  Once     05/15/19 1016   05/15/19 1016  For home use only DME Walker rolling  Once    Question:  Patient needs a walker to treat with the following condition  Answer:  Status post spinal surgery   05/15/19 1016          Disposition: home   Final Dx: posterior cervical fusion and decompression C3-C7  Discharge Instructions     Remove dressing in 72 hours   Complete by: As directed    Call MD for:  difficulty breathing, headache or visual disturbances   Complete by: As directed    Call MD for:  hives   Complete by: As directed    Call MD for:  persistant nausea and vomiting   Complete by: As directed    Call MD for:  redness, tenderness, or signs of infection (pain, swelling, redness, odor or green/yellow discharge  around incision site)   Complete by: As directed    Call MD for:  severe uncontrolled pain   Complete by: As directed    Call MD for:  temperature >100.4   Complete by: As directed    Diet - low sodium heart healthy   Complete by: As directed    Driving Restrictions   Complete by: As directed    No driving for 2 weeks, no riding in the car for 1 week   Increase activity slowly   Complete by: As directed    Lifting restrictions   Complete by: As directed    No lifting more than 8 lbs         Signed: Ocie Cornfield Sabri Teal 05/15/2019, 12:13 PM

## 2019-05-15 NOTE — Progress Notes (Signed)
Pt and family given D/C instructions with verbal understanding. Pt's incision is clean and dry with no sign of infection. IV and Hemovac were removed prior to D/C. Pt received RW and 3-n-1 per MD order. Home Health was declined. Pt D/C'd home via wheelchair @ 1355 per MD order. Pt is stable @ D/C and has no other needs at this time. Holli Humbles, RN

## 2019-05-15 NOTE — Discharge Instructions (Signed)

## 2019-05-15 NOTE — Progress Notes (Signed)
Orthopedic Tech Progress Note Patient Details:  Keith Fisher 1946/04/07 [id number] RN said patient has collar Patient ID: Daun Peacock, male   DOB: 1945-12-14, 73 y.o.   MRN: [id number]   Janit Pagan 05/15/2019, 7:58 AM

## 2019-05-20 ENCOUNTER — Encounter (HOSPITAL_COMMUNITY): Payer: Self-pay | Admitting: Neurosurgery

## 2019-05-27 DIAGNOSIS — G959 Disease of spinal cord, unspecified: Secondary | ICD-10-CM | POA: Diagnosis not present

## 2019-06-17 ENCOUNTER — Other Ambulatory Visit: Payer: Self-pay | Admitting: Cardiology

## 2019-06-17 DIAGNOSIS — M542 Cervicalgia: Secondary | ICD-10-CM | POA: Diagnosis not present

## 2019-06-17 DIAGNOSIS — G959 Disease of spinal cord, unspecified: Secondary | ICD-10-CM | POA: Diagnosis not present

## 2019-06-17 DIAGNOSIS — M4803 Spinal stenosis, cervicothoracic region: Secondary | ICD-10-CM

## 2019-07-02 ENCOUNTER — Ambulatory Visit (INDEPENDENT_AMBULATORY_CARE_PROVIDER_SITE_OTHER): Payer: BC Managed Care – PPO | Admitting: Orthopedic Surgery

## 2019-07-02 ENCOUNTER — Other Ambulatory Visit: Payer: Self-pay

## 2019-07-02 ENCOUNTER — Encounter: Payer: Self-pay | Admitting: Orthopedic Surgery

## 2019-07-02 DIAGNOSIS — M25561 Pain in right knee: Secondary | ICD-10-CM | POA: Diagnosis not present

## 2019-07-02 DIAGNOSIS — M1711 Unilateral primary osteoarthritis, right knee: Secondary | ICD-10-CM | POA: Diagnosis not present

## 2019-07-02 MED ORDER — BUPIVACAINE HCL 0.25 % IJ SOLN
4.0000 mL | INTRAMUSCULAR | Status: AC | PRN
  Administered 2019-07-02: 4 mL via INTRA_ARTICULAR

## 2019-07-02 MED ORDER — LIDOCAINE HCL 1 % IJ SOLN
5.0000 mL | INTRAMUSCULAR | Status: AC | PRN
  Administered 2019-07-02: 5 mL

## 2019-07-02 MED ORDER — METHYLPREDNISOLONE ACETATE 40 MG/ML IJ SUSP
40.0000 mg | INTRAMUSCULAR | Status: AC | PRN
  Administered 2019-07-02: 40 mg via INTRA_ARTICULAR

## 2019-07-02 NOTE — Progress Notes (Signed)
Office Visit Note   Patient: Keith Fisher           Date of Birth: Feb 02, 1946           MRN: [id number] Visit Date: 07/02/2019 Requested by: London Pepper, MD [address],  [place] [address] PCP: London Pepper, MD  Subjective: Chief Complaint  Patient presents with  . Knee Pain    HPI: Keith Fisher is a patient with right knee pain.  Has parasitic infection affecting the right leg.  Had spine surgery of the cervical spine in September.  Has done well with that in terms of balance but now he reports very significant right knee pain.  No interval history of injury or trauma.  Interestingly he did state that his right leg swelling went down considerably following the cervical spine surgery but then it swelled back up again and has become painful.  Never before has the right leg swelling become painful.  He also reports significant knee pain.  Has known arthritis in the knee predominantly lateral compartment but generally tricompartmental.              ROS: All systems reviewed are negative as they relate to the chief complaint within the history of present illness.  Patient denies  fevers or chills.   Assessment & Plan: Visit Diagnoses:  1. Right knee pain, unspecified chronicity     Plan: Impression is right knee arthritis.  Plan is aspiration and injection today.  The aspiration gave [place] about 70 cc of serosanguineous fluid.  Sent that for crystals.  Injected cortisone.  Need to get ultrasound that right lower extremity just to rule out DVT.  He does have a lot of swelling.  The amount of swelling in the calf would potentially be problematic following knee replacement in which the lower extremity swells more anyway.  Will consider options and see him back in 3 months for potential repeat injection.  Total knee replacement is not ruled out but that would increase the swelling in his right calf region.  I will call him with results of the lab work and ultrasound.   Follow-Up Instructions: Return in about 3 months (around 10/02/2019).   Orders:  Orders Placed This Encounter  Procedures  . Cell count + diff,  w/ cryst-synvl fld  . VAS [place] LOWER EXTREMITY VENOUS (DVT)   No orders of the defined types were placed in this encounter.     Procedures: Large Joint Inj: R knee on 07/02/2019 12:46 PM Indications: diagnostic evaluation, joint swelling and pain Details: 18 G 1.5 in needle, superolateral approach  Arthrogram: No  Medications: 5 mL lidocaine 1 %; 40 mg methylPREDNISolone acetate 40 MG/ML; 4 mL bupivacaine 0.25 % Aspirate: 70 mL serous Outcome: tolerated well, no immediate complications Procedure, treatment alternatives, risks and benefits explained, specific risks discussed. Consent was given by the patient. Immediately prior to procedure a time out was called to verify the correct patient, procedure, equipment, support staff and site/side marked as required. Patient was prepped and draped in the usual sterile fashion.       Clinical Data: No additional findings.  Objective: Vital Signs: There were no vitals taken for this visit.  Physical Exam:   Constitutional: Patient appears well-developed HEENT:  Head: Normocephalic Eyes:EOM are normal Neck: Normal range of motion Cardiovascular: Normal rate Pulmonary/chest: Effort normal Neurologic: Patient is alert Skin: Skin is warm Psychiatric: Patient has normal mood and affect    Ortho Exam: Ortho exam  demonstrates full active and passive range of motion of the left knee.  Right knee has moderate effusion.  Extensor mechanism is intact.  There is some swelling which is not really edema but more like lymphedema type swelling in that right leg.  Does have tenderness to palpation but no fluctuance or crepitus.  No groin pain with internal X rotation of the leg.  No other masses lymphadenopathy or skin changes noted in that right leg region.  Specialty Comments:  No specialty comments  available.  Imaging: No results found.   PMFS History: Patient Active Problem List   Diagnosis Date Noted  . Myelopathy (HCC) 05/14/2019  . Dyspnea on exertion 03/05/2017  . Mitral regurgitation 03/05/2017   Past Medical History:  Diagnosis Date  . Dyspnea   . Lymphatic filariasis   . Mitral valve regurgitation   . Pulmonary hypertension (HCC)   . Tricuspid regurgitation     History reviewed. No pertinent family history.  Past Surgical History:  Procedure Laterality Date  . POSTERIOR CERVICAL FUSION/FORAMINOTOMY N/A 05/14/2019   Procedure: Posterior Cervical Fusion and decompression with lateral mass fixation - Cervical Three-Four,Cervical Four-Five, Cervical Five-Six, Cervical Six-Seven.;  Surgeon: Donalee Citrin, MD;  Location[facility];  Service: Neurosurgery;  Laterality: N/A;  posterior  . SHOULDER SURGERY    . TEE WITHOUT CARDIOVERSION N/A 03/06/2017   Procedure: TRANSESOPHAGEAL ECHOCARDIOGRAM (TEE);  Surgeon: Yates Decamp, MD;  Location: [facility] ENDOSCOPY;  Service: Cardiovascular;  Laterality: N/A;   Social History   Occupational History  . Not on file  Tobacco Use  . Smoking status: Never Smoker  . Smokeless tobacco: Never Used  Substance and Sexual Activity  . Alcohol use: No  . Drug use: No  . Sexual activity: Not on file

## 2019-07-02 NOTE — Progress Notes (Signed)
Cell

## 2019-07-03 ENCOUNTER — Ambulatory Visit (HOSPITAL_COMMUNITY)
Admission: RE | Admit: 2019-07-03 | Discharge: 2019-07-03 | Disposition: A | Discharge status: Home / Self Care | Payer: BC Managed Care – PPO | Source: Ambulatory Visit | Attending: Orthopedic Surgery | Admitting: Orthopedic Surgery

## 2019-07-03 ENCOUNTER — Telehealth: Payer: Self-pay

## 2019-07-03 DIAGNOSIS — M25561 Pain in right knee: Secondary | ICD-10-CM | POA: Diagnosis not present

## 2019-07-03 LAB — SYNOVIAL CELL COUNT + DIFF, W/ CRYSTALS
Basophils, %: 0 %
Eosinophils-Synovial: 0 % (ref 0–2)
Lymphocytes-Synovial Fld: 20 % (ref 0–74)
Monocyte/Macrophage: 65 % (ref 0–69)
Neutrophil, Synovial: 5 % (ref 0–24)
Synoviocytes, %: 10 % (ref 0–15)
WBC, Synovial: 154 cells/uL — ABNORMAL HIGH (ref <150)

## 2019-07-03 NOTE — Telephone Encounter (Signed)
Keith Fisher at [facility] with [facility] called stating that patient is Negative for DVT, right leg.

## 2019-07-03 NOTE — Progress Notes (Signed)
RLE venous duplex       has been completed. Preliminary results can be found under CV proc through chart review. Demarkis Gheen, BS, RDMS, RVT   

## 2019-07-24 DIAGNOSIS — L309 Dermatitis, unspecified: Secondary | ICD-10-CM | POA: Diagnosis not present

## 2019-07-29 DIAGNOSIS — G959 Disease of spinal cord, unspecified: Secondary | ICD-10-CM | POA: Diagnosis not present

## 2019-08-20 DIAGNOSIS — M545 Low back pain: Secondary | ICD-10-CM | POA: Diagnosis not present

## 2019-08-20 DIAGNOSIS — M48062 Spinal stenosis, lumbar region with neurogenic claudication: Secondary | ICD-10-CM | POA: Diagnosis not present

## 2019-08-20 DIAGNOSIS — M6281 Muscle weakness (generalized): Secondary | ICD-10-CM | POA: Diagnosis not present

## 2019-08-20 DIAGNOSIS — M25561 Pain in right knee: Secondary | ICD-10-CM | POA: Diagnosis not present

## 2019-09-03 DIAGNOSIS — M6281 Muscle weakness (generalized): Secondary | ICD-10-CM | POA: Diagnosis not present

## 2019-09-03 DIAGNOSIS — M25561 Pain in right knee: Secondary | ICD-10-CM | POA: Diagnosis not present

## 2019-09-03 DIAGNOSIS — M48062 Spinal stenosis, lumbar region with neurogenic claudication: Secondary | ICD-10-CM | POA: Diagnosis not present

## 2019-09-03 DIAGNOSIS — M545 Low back pain: Secondary | ICD-10-CM | POA: Diagnosis not present

## 2019-09-18 DIAGNOSIS — G959 Disease of spinal cord, unspecified: Secondary | ICD-10-CM | POA: Diagnosis not present

## 2019-09-18 DIAGNOSIS — M5137 Other intervertebral disc degeneration, lumbosacral region: Secondary | ICD-10-CM | POA: Diagnosis not present

## 2019-09-18 DIAGNOSIS — M7989 Other specified soft tissue disorders: Secondary | ICD-10-CM | POA: Diagnosis not present

## 2019-09-19 DIAGNOSIS — M9902 Segmental and somatic dysfunction of thoracic region: Secondary | ICD-10-CM | POA: Diagnosis not present

## 2019-09-19 DIAGNOSIS — M4716 Other spondylosis with myelopathy, lumbar region: Secondary | ICD-10-CM | POA: Diagnosis not present

## 2019-09-19 DIAGNOSIS — M9903 Segmental and somatic dysfunction of lumbar region: Secondary | ICD-10-CM | POA: Diagnosis not present

## 2019-09-19 DIAGNOSIS — M9905 Segmental and somatic dysfunction of pelvic region: Secondary | ICD-10-CM | POA: Diagnosis not present

## 2019-09-22 ENCOUNTER — Other Ambulatory Visit: Payer: Self-pay | Admitting: Neurosurgery

## 2019-09-22 DIAGNOSIS — G959 Disease of spinal cord, unspecified: Secondary | ICD-10-CM

## 2019-10-02 ENCOUNTER — Ambulatory Visit: Payer: BC Managed Care – PPO | Admitting: Orthopedic Surgery

## 2019-10-16 ENCOUNTER — Ambulatory Visit
Admission: RE | Admit: 2019-10-16 | Discharge: 2019-10-16 | Disposition: A | Discharge status: Home / Self Care | Payer: BC Managed Care – PPO | Source: Ambulatory Visit | Attending: Neurosurgery | Admitting: Neurosurgery

## 2019-10-16 DIAGNOSIS — M4802 Spinal stenosis, cervical region: Secondary | ICD-10-CM | POA: Diagnosis not present

## 2019-10-16 DIAGNOSIS — G959 Disease of spinal cord, unspecified: Secondary | ICD-10-CM

## 2019-10-16 DIAGNOSIS — M48061 Spinal stenosis, lumbar region without neurogenic claudication: Secondary | ICD-10-CM | POA: Diagnosis not present

## 2019-10-16 DIAGNOSIS — M4804 Spinal stenosis, thoracic region: Secondary | ICD-10-CM | POA: Diagnosis not present

## 2019-10-28 DIAGNOSIS — G959 Disease of spinal cord, unspecified: Secondary | ICD-10-CM | POA: Diagnosis not present

## 2019-10-28 DIAGNOSIS — M5137 Other intervertebral disc degeneration, lumbosacral region: Secondary | ICD-10-CM | POA: Diagnosis not present

## 2019-11-13 ENCOUNTER — Ambulatory Visit: Payer: BC Managed Care – PPO | Attending: Family

## 2019-11-13 DIAGNOSIS — Z23 Encounter for immunization: Secondary | ICD-10-CM

## 2019-11-13 NOTE — Progress Notes (Signed)
   Covid-19 Vaccination Clinic  Name:  Keith Fisher    MRN: [id number] DOB: 01-20-1946  11/13/2019  [name] was observed post Covid-19 immunization for 15 minutes without incident. He was provided with Vaccine Information Sheet and instruction to access the V-Safe system.   [name] was instructed to call 911 with any severe reactions post vaccine: Marland Kitchen Difficulty breathing  . Swelling of face and throat  . A fast heartbeat  . A bad rash all over body  . Dizziness and weakness   Immunizations Administered    Name Date Dose VIS Date Route   Moderna COVID-19 Vaccine 11/13/2019 10:08 AM 0.5 mL 08/12/2019 Intramuscular   Manufacturer: Moderna   Lot: [id number]   NDC: 62703-500-93

## 2019-12-01 DIAGNOSIS — M48062 Spinal stenosis, lumbar region with neurogenic claudication: Secondary | ICD-10-CM | POA: Diagnosis not present

## 2019-12-16 ENCOUNTER — Ambulatory Visit: Payer: BC Managed Care – PPO | Attending: Family

## 2019-12-16 DIAGNOSIS — Z23 Encounter for immunization: Secondary | ICD-10-CM

## 2019-12-16 NOTE — Progress Notes (Signed)
   Covid-19 Vaccination Clinic  Name:  Keith Fisher    MRN: [id number] DOB: 06-08-1946  12/16/2019  Mr. [name] was observed post Covid-19 immunization for 15 minutes without incident. He was provided with Vaccine Information Sheet and instruction to access the V-Safe system.   Mr. Grieshop was instructed to call 911 with any severe reactions post vaccine: Marland Kitchen Difficulty breathing  . Swelling of face and throat  . A fast heartbeat  . A bad rash all over body  . Dizziness and weakness   Immunizations Administered    Name Date Dose VIS Date Route   Moderna COVID-19 Vaccine 12/16/2019  2:08 PM 0.5 mL 08/12/2019 Intramuscular   Manufacturer: Moderna   Lot: [id number]   NDC: 50037-048-88

## 2020-03-11 DIAGNOSIS — M48062 Spinal stenosis, lumbar region with neurogenic claudication: Secondary | ICD-10-CM | POA: Diagnosis not present

## 2020-04-22 ENCOUNTER — Ambulatory Visit: Payer: BC Managed Care – PPO | Admitting: Cardiology

## 2020-06-23 ENCOUNTER — Other Ambulatory Visit: Payer: Self-pay | Admitting: Cardiology

## 2020-06-23 DIAGNOSIS — M4803 Spinal stenosis, cervicothoracic region: Secondary | ICD-10-CM

## 2020-06-23 DIAGNOSIS — G959 Disease of spinal cord, unspecified: Secondary | ICD-10-CM | POA: Diagnosis not present

## 2020-06-23 DIAGNOSIS — R269 Unspecified abnormalities of gait and mobility: Secondary | ICD-10-CM | POA: Diagnosis not present

## 2020-06-23 DIAGNOSIS — M48062 Spinal stenosis, lumbar region with neurogenic claudication: Secondary | ICD-10-CM | POA: Diagnosis not present

## 2020-06-28 ENCOUNTER — Telehealth: Payer: Self-pay

## 2020-06-28 ENCOUNTER — Other Ambulatory Visit: Payer: Self-pay

## 2020-06-28 NOTE — Telephone Encounter (Signed)
Yes

## 2020-06-28 NOTE — Telephone Encounter (Signed)
Son called requesting a refill of gabapentin. Please call

## 2020-06-28 NOTE — Telephone Encounter (Signed)
Waiting for response from Provider.

## 2020-06-28 NOTE — Telephone Encounter (Signed)
Is this ok to refill?  

## 2020-06-30 ENCOUNTER — Other Ambulatory Visit: Payer: Self-pay

## 2020-06-30 DIAGNOSIS — M4803 Spinal stenosis, cervicothoracic region: Secondary | ICD-10-CM

## 2020-06-30 MED ORDER — GABAPENTIN 300 MG PO CAPS: ORAL_CAPSULE | ORAL | 6 refills | Status: DC

## 2020-10-20 IMAGING — MR MRI LUMBAR SPINE WITHOUT CONTRAST
5 series · 48 of 48 positions shown · non-contrast
Comparison: 08/12/2016

CLINICAL DATA: Chronic low back pain with pain going into both
legs. NKI. Patient having pain with sitting. Steriod injection 2mths
ago with zero relief.

EXAM:
MRI LUMBAR SPINE WITHOUT CONTRAST
TECHNIQUE: Multiplanar, multisequence MR imaging of the lumbar spine was
performed. No intravenous contrast was administered.

[Series 3: T2 post-contrast · sagittal · 4.0mm · 0.88mm/px · 6 of 14 slices shown]
[im 1/14]
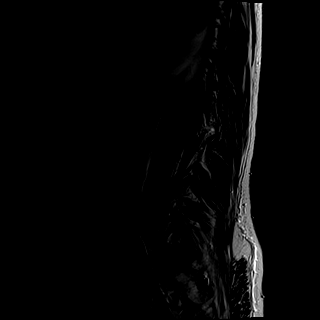
[im 3/14]
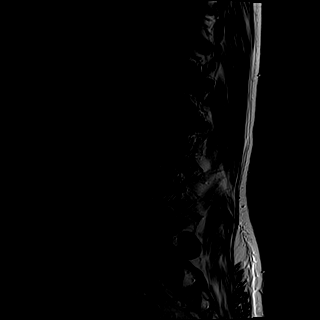
[im 6/14]
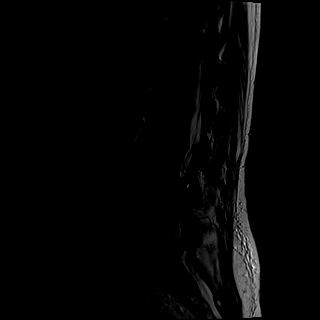
[im 8/14]
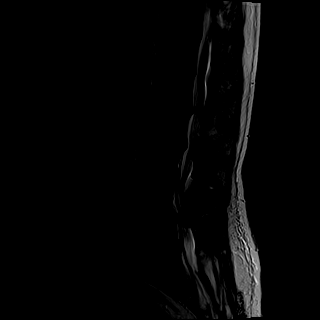
[im 11/14]
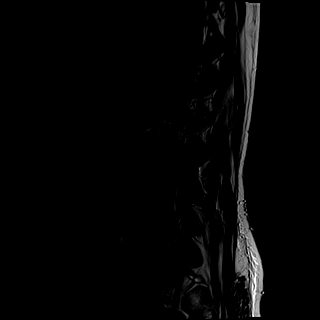
[im 14/14]
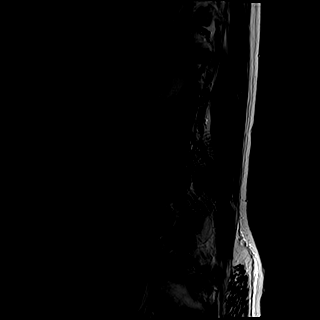

[Series 4: T2 · axial · 4.0mm · 0.78mm/px · z∈[-76,+129]mm · 15 of 35 slices shown]
[im 1/35]
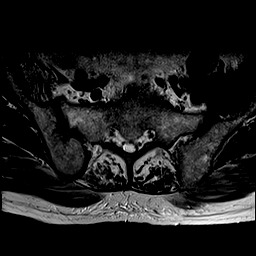
[im 3/35]
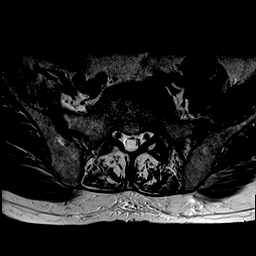
[im 5/35]
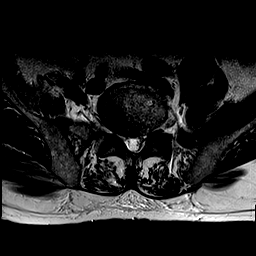
[im 8/35]
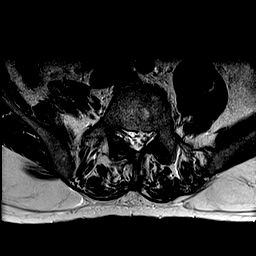
[im 10/35]
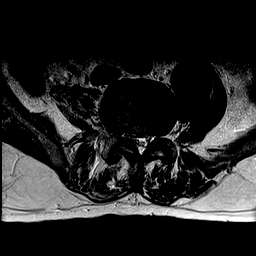
[im 13/35]
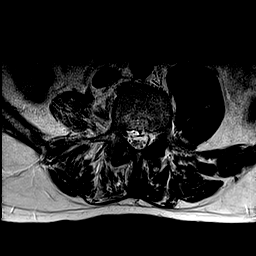
[im 15/35]
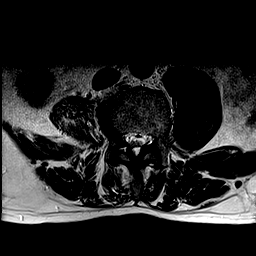
[im 18/35]
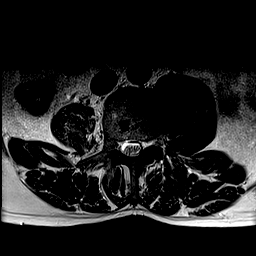
[im 20/35]
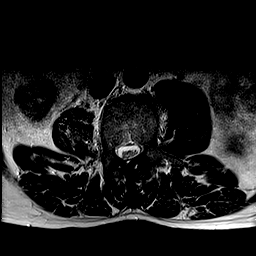
[im 22/35]
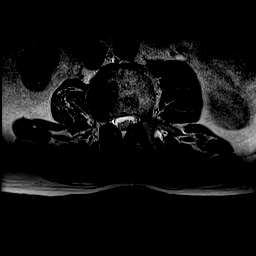
[im 25/35]
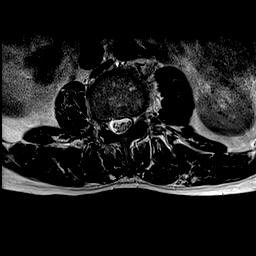
[im 27/35]
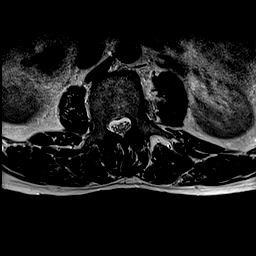
[im 30/35]
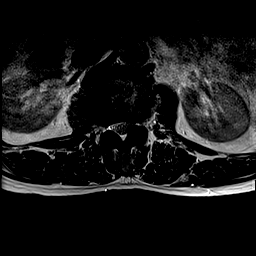
[im 32/35]
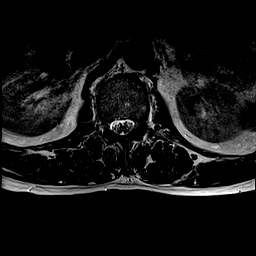
[im 35/35]
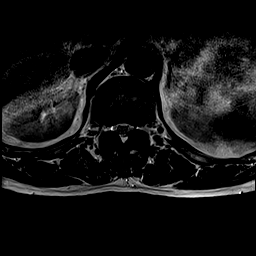

[Series 5: tirm sag · sagittal · 4.0mm · 0.73mm/px · 6 of 14 slices shown]
[im 1/14]
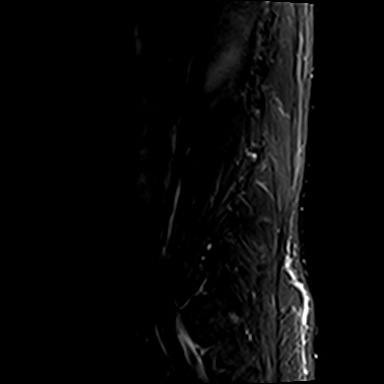
[im 3/14]
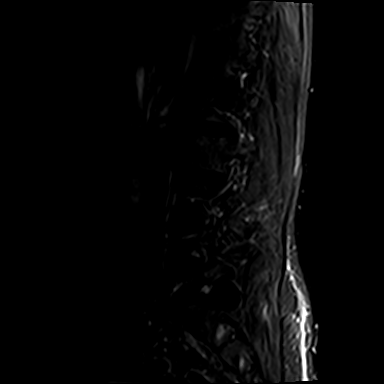
[im 6/14]
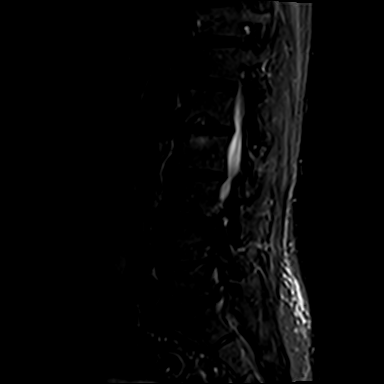
[im 8/14]
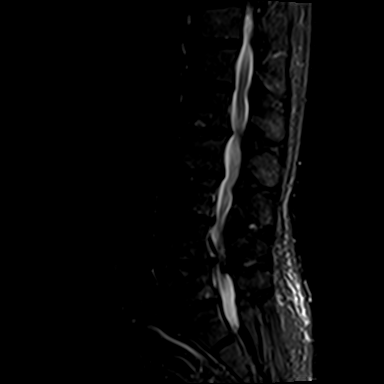
[im 11/14]
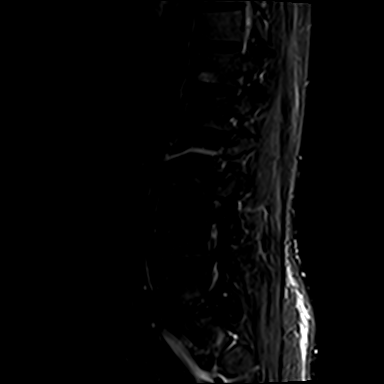
[im 14/14]
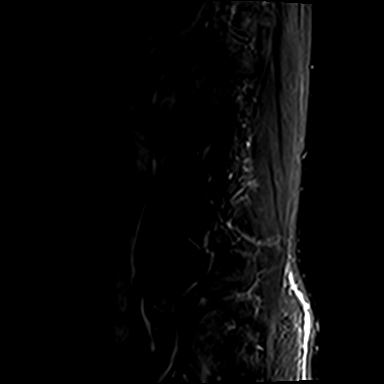

[Series 6: T1 · axial · 4.0mm · 0.78mm/px · z∈[-76,+129]mm · 15 of 35 slices shown (1 of 2)]
[im 1/35]
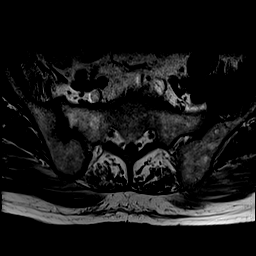
[im 3/35]
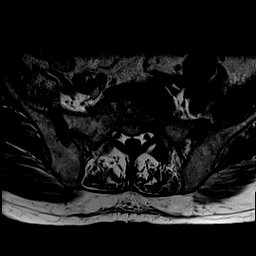
[im 5/35]
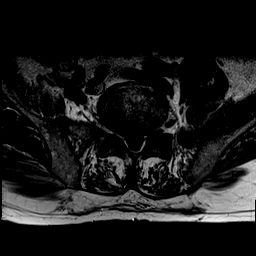
[im 8/35]
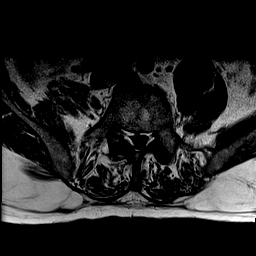
[im 10/35]
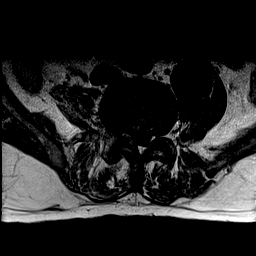
[im 13/35]
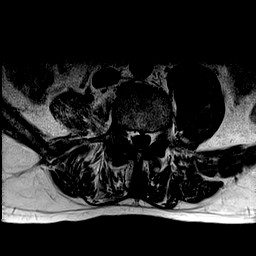
[im 15/35]
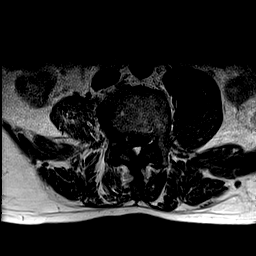
[im 18/35]
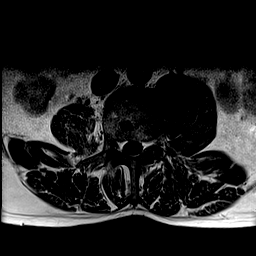
[im 20/35]
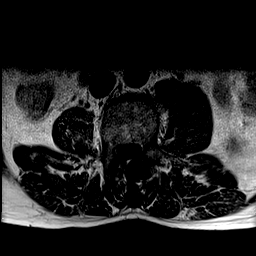
[im 22/35]
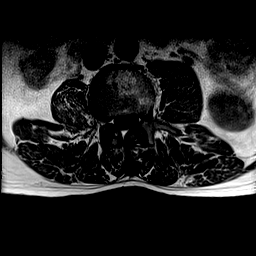
[im 25/35]
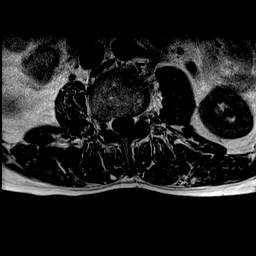
[im 27/35]
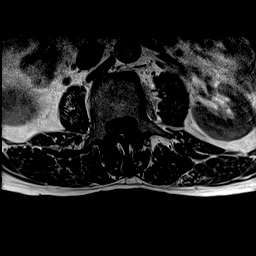
[im 30/35]
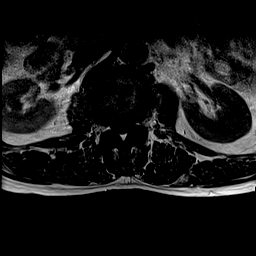
[im 32/35]
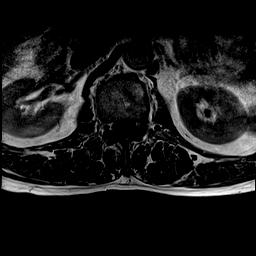
[im 35/35]
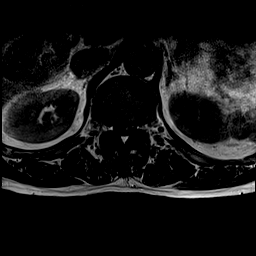

[Series 7: T1 · sagittal · 4.0mm · 0.88mm/px · 6 of 14 slices shown (2 of 2)]
[im 1/14]
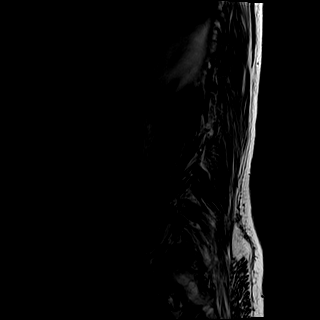
[im 3/14]
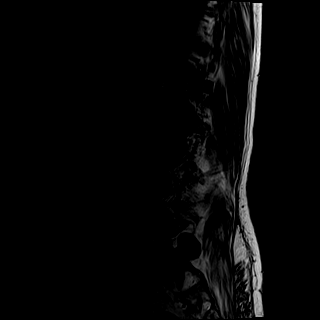
[im 6/14]
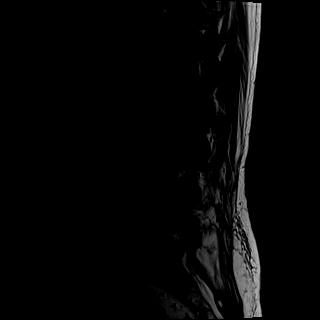
[im 8/14]
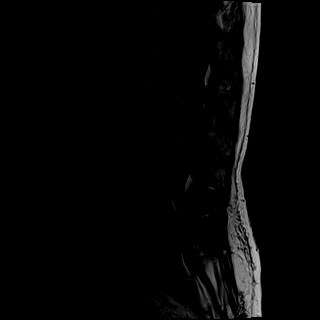
[im 11/14]
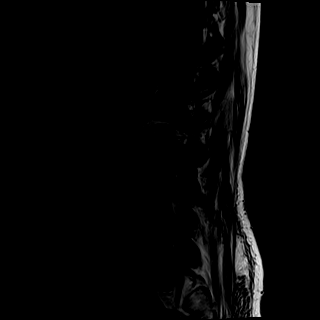
[im 14/14]
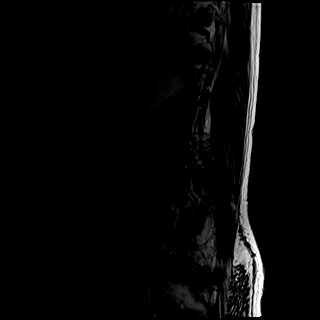

[48 of 48 positions shown; findings below may reference images not displayed]

FINDINGS: Segmentation:  Standard.

Alignment:  Physiologic.

Vertebrae:  No fracture, evidence of discitis, or bone lesion.

Conus medullaris and cauda equina: Conus extends to the L1 level.
Conus and cauda equina appear normal.

Paraspinal and other soft tissues: No acute paraspinal abnormality.

Disc levels:

Disc spaces: Degenerative disease with disc height loss throughout
the lumbar spine.

T12-L1: Mild broad-based disc bulge. Moderate bilateral facet
arthropathy. Mild spinal stenosis. Mild bilateral foraminal
stenosis.

L1-L2: Broad-based disc bulge with a left foraminal/lateral disc
protrusion. Moderate bilateral facet arthropathy. Left lateral
recess stenosis. Mild spinal stenosis. No evidence of neural
foraminal stenosis.

L2-L3: Broad-based disc bulge. Moderate bilateral facet arthropathy.
Bilateral lateral recess narrowing. Mild bilateral foraminal
narrowing.

L3-L4: Broad-based disc bulge. Moderate bilateral facet arthropathy.
Moderate-severe spinal stenosis and bilateral lateral recess
stenosis. Moderate right and mild left foraminal stenosis.

L4-L5: Broad-based disc bulge. Moderate bilateral facet arthropathy.
Right lateral recess narrowing. Mild spinal stenosis. Moderate right
foraminal stenosis.

L5-S1: Broad-based disc osteophyte complex. Mild bilateral facet
arthropathy. Mild bilateral foraminal stenosis.
IMPRESSION: 1. Diffuse lumbar spine spondylosis as detailed above.
2.  No acute osseous injury of the lumbar spine.
3. No significant interval change compared with 08/12/2016.

## 2020-10-27 ENCOUNTER — Ambulatory Visit (INDEPENDENT_AMBULATORY_CARE_PROVIDER_SITE_OTHER): Payer: 59 | Admitting: Orthopedic Surgery

## 2020-10-27 ENCOUNTER — Telehealth: Payer: Self-pay

## 2020-10-27 ENCOUNTER — Ambulatory Visit (INDEPENDENT_AMBULATORY_CARE_PROVIDER_SITE_OTHER): Payer: 59

## 2020-10-27 DIAGNOSIS — M1711 Unilateral primary osteoarthritis, right knee: Secondary | ICD-10-CM

## 2020-10-27 DIAGNOSIS — M25561 Pain in right knee: Secondary | ICD-10-CM | POA: Diagnosis not present

## 2020-10-27 NOTE — Telephone Encounter (Signed)
Can we please get auth for patient for right knee gel injection? Thanks

## 2020-10-27 NOTE — Progress Notes (Signed)
Office Visit Note   Patient: Keith Fisher           Date of Birth: [birth year]           MRN: [id number] Visit Date: 10/27/2020 Requested by: Farris Has, MD [address] [place],  [place] [address] PCP: Farris Has, MD  Subjective: Chief Complaint  Patient presents with  . Right Knee - Pain    HPI: Patient presents for evaluation of right knee pain.  Keith Fisher describes recent worsening of his right knee pain.  Has a known history of arthritis.  Also has a chronic parasitic infection in that right leg which causes his calf to swell.  Denies any interval history of injury.  Takes over-the-counter medication as needed.              ROS: All systems reviewed are negative as they relate to the chief complaint within the history of present illness.  Patient denies  fevers or chills.   Assessment & Plan: Visit Diagnoses:  1. Right knee pain, unspecified chronicity     Plan: Impression is right knee pain with arthritis.  Aspiration injection performed today.  We will preapproved for gel shots as well.  Overall the patient's arthritis is severe but he is still able to get around.  He is having some issues with balance due to his back.  We will see him back likely within 4 to 8 weeks for gel injection. This patient is diagnosed with osteoarthritis of the knee(s).    Radiographs show evidence of joint space narrowing, osteophytes, subchondral sclerosis and/or subchondral cysts.  This patient has knee pain which interferes with functional and activities of daily living.    This patient has experienced inadequate response, adverse effects and/or intolerance with conservative treatments such as acetaminophen, NSAIDS, topical creams, physical therapy or regular exercise, knee bracing and/or weight loss.   This patient has experienced inadequate response or has a contraindication to intra articular steroid injections for at least 3 months.   This patient is not scheduled to  have a total knee replacement within 6 months of starting treatment with viscosupplementation.   Follow-Up Instructions: No follow-ups on file.   Orders:  Orders Placed This Encounter  Procedures  . XR Knee 1-2 Views Right   No orders of the defined types were placed in this encounter.     Procedures: Large Joint Inj: R knee on 10/27/2020 8:58 AM Indications: diagnostic evaluation, joint swelling and pain Details: 18 G 1.5 in needle, superolateral approach  Arthrogram: No  Medications: 5 mL lidocaine 1 %; 4 mL bupivacaine 0.25 %; 6 mg betamethasone acetate-betamethasone sodium phosphate 6 (3-3) MG/ML Outcome: tolerated well, no immediate complications Procedure, treatment alternatives, risks and benefits explained, specific risks discussed. Consent was given by the patient. Immediately prior to procedure a time out was called to verify the correct patient, procedure, equipment, support staff and site/side marked as required. Patient was prepped and draped in the usual sterile fashion.       Clinical Data: No additional findings.  Objective: Vital Signs: There were no vitals taken for this visit.  Physical Exam:   Constitutional: Patient appears well-developed HEENT:  Head: Normocephalic Eyes:EOM are normal Neck: Normal range of motion Cardiovascular: Normal rate Pulmonary/chest: Effort normal Neurologic: Patient is alert Skin: Skin is warm Psychiatric: Patient has normal mood and affect    Ortho Exam: Ortho exam demonstrates mild effusion in the right knee.  Extensor mechanism is intact.  No  groin pain with internal ex rotation of the leg.  No masses lymphadenopathy or skin changes noted in that right knee region.  Pedal pulses palpable.  Some swelling is present in that right calf region consistent with his known history of parasitic infection.  Specialty Comments:  No specialty comments available.  Imaging: No results found.   PMFS History: Patient Active  Problem List   Diagnosis Date Noted  . Myelopathy (HCC) 05/14/2019  . Dyspnea on exertion 03/05/2017  . Mitral regurgitation 03/05/2017   Past Medical History:  Diagnosis Date  . Dyspnea   . Lymphatic filariasis   . Mitral valve regurgitation   . Pulmonary hypertension (HCC)   . Tricuspid regurgitation     No family history on file.  Past Surgical History:  Procedure Laterality Date  . POSTERIOR CERVICAL FUSION/FORAMINOTOMY N/A 05/14/2019   Procedure: Posterior Cervical Fusion and decompression with lateral mass fixation - Cervical Three-Four,Cervical Four-Five, Cervical Five-Six, Cervical Six-Seven.;  Surgeon: Donalee Citrin, MD;  Location: [facility] OR;  Service: Neurosurgery;  Laterality: N/A;  posterior  . SHOULDER SURGERY    . TEE WITHOUT CARDIOVERSION N/A 03/06/2017   Procedure: TRANSESOPHAGEAL ECHOCARDIOGRAM (TEE);  Surgeon: Yates Decamp, MD;  Location: [facility] ENDOSCOPY;  Service: Cardiovascular;  Laterality: N/A;   Social History   Occupational History  . Not on file  Tobacco Use  . Smoking status: Never Smoker  . Smokeless tobacco: Never Used  Vaping Use  . Vaping Use: Never used  Substance and Sexual Activity  . Alcohol use: No  . Drug use: No  . Sexual activity: Not on file

## 2020-10-27 NOTE — Telephone Encounter (Signed)
Noted  

## 2020-10-30 ENCOUNTER — Encounter: Payer: Self-pay | Admitting: Orthopedic Surgery

## 2020-10-30 MED ORDER — BUPIVACAINE HCL 0.25 % IJ SOLN
4.0000 mL | INTRAMUSCULAR | Status: AC | PRN
  Administered 2020-10-27: 4 mL via INTRA_ARTICULAR

## 2020-10-30 MED ORDER — BETAMETHASONE SOD PHOS & ACET 6 (3-3) MG/ML IJ SUSP
6.0000 mg | INTRAMUSCULAR | Status: AC | PRN
  Administered 2020-10-27: 6 mg via INTRA_ARTICULAR

## 2020-10-30 MED ORDER — LIDOCAINE HCL 1 % IJ SOLN
5.0000 mL | INTRAMUSCULAR | Status: AC | PRN
  Administered 2020-10-27: 5 mL

## 2020-11-04 ENCOUNTER — Telehealth: Payer: Self-pay

## 2020-11-04 NOTE — Telephone Encounter (Signed)
Submitted VOB for SynviscOne, right knee. Pending BV. 

## 2020-11-09 ENCOUNTER — Telehealth: Payer: Self-pay

## 2020-11-09 NOTE — Telephone Encounter (Signed)
PA required for SynviscOne, right knee. Faxed completed PA form to [facility] at [phone].

## 2020-11-10 NOTE — Telephone Encounter (Signed)
Keith Fisher with [employer] called stating they will need the provider and service providers (if we have one) name, number, fax # and address, as well as their MPI and tax ID. Keith Fisher advised we can submit all this info by going to [employer]'s web site, clicking the provider tab and scrolling all the way down until you see availty and you can click that and submit the information through there.   Keith Fisher CB# [phone] *Ext [phone]

## 2020-11-10 NOTE — Telephone Encounter (Signed)
Talked with [facility] and provided her with the service/requesting provider information and any other information that was needed for PA.

## 2020-11-11 ENCOUNTER — Ambulatory Visit: Payer: BC Managed Care – PPO | Admitting: Orthopedic Surgery

## 2020-11-23 ENCOUNTER — Telehealth: Payer: Self-pay

## 2020-11-23 NOTE — Telephone Encounter (Signed)
Called and left a VM advising patient that he was denied through his insurance, [employer] for gel injection.  Advised him that we do have another gel injection that he could try, but it would cost $250 for a series of 3 for the right knee. Advised patient to call back if he would like to proceed.

## 2021-01-09 ENCOUNTER — Other Ambulatory Visit: Payer: Self-pay | Admitting: Cardiology

## 2021-01-09 DIAGNOSIS — M4803 Spinal stenosis, cervicothoracic region: Secondary | ICD-10-CM

## 2021-02-18 ENCOUNTER — Ambulatory Visit (INDEPENDENT_AMBULATORY_CARE_PROVIDER_SITE_OTHER): Payer: 59 | Admitting: Orthopedic Surgery

## 2021-02-18 ENCOUNTER — Encounter: Payer: Self-pay | Admitting: Orthopedic Surgery

## 2021-02-18 DIAGNOSIS — M1711 Unilateral primary osteoarthritis, right knee: Secondary | ICD-10-CM | POA: Diagnosis not present

## 2021-02-18 DIAGNOSIS — M4803 Spinal stenosis, cervicothoracic region: Secondary | ICD-10-CM

## 2021-02-18 MED ORDER — GABAPENTIN 300 MG PO CAPS: ORAL_CAPSULE | ORAL | 3 refills | Status: DC

## 2021-02-18 NOTE — Progress Notes (Signed)
Office Visit Note   Patient: Keith Fisher           Date of Birth: 10-23-1945           MRN: [id number] Visit Date: 02/18/2021 Requested by: Farris Has, MD [address] [place],  [place] [address] PCP: Farris Has, MD  Subjective: Chief Complaint  Patient presents with   Right Knee - Pain    HPI: Keith Fisher is a 75 year old patient with right knee arthritis.  He has a parasitic infection which causes a fair amount of swelling in that calf region.  He also asked me to look at issues related to his neck and lower back.  He had fairly significant cervical spine full spine stenosis treated with surgery about a year and a half ago.  Scans from 820 and postsurgical scans from last year of the cervical spine are reviewed.  In general the area looks very decompressed but there is persistent myelomalacia in the C6-7 region consistent with his hand weakness.  He also reports neck stiffness which is consistent with fusion from C3-C7.  He has not really improving much.  In general he feels like the neck surgery did not help him much; however, the amount of decompression is clear and I think it prevented him from progressive myelopathy development.  Lumbar spine also shows significant facet arthritis along with mild to moderate spinal stenosis.  Not sure if that is a surgical problem in the back yet.  He would like to have another opinion about his back and neck which upon my brief review appear to have been adequately treated.  Not sure if he had's that enough for him yet to want to undergo another back surgery.  The language barrier is problematic in that regard.  He does report some paresthesias in the legs.  He also has pretty severe right knee arthritis.              ROS: All systems reviewed are negative as they relate to the chief complaint within the history of present illness.  Patient denies  fevers or chills.   Assessment & Plan: Visit Diagnoses:  1. Unilateral primary  osteoarthritis, right knee   2. Spinal stenosis of cervicothoracic region     Plan: Impression is right knee arthritis.  Plan is right knee aspiration and injection today.  I think this can help him temporarily.  Approved him for gel shots last clinic visit in February.  We will plan on injecting the gel shots in 4 months.  In order to circumvent the language barrier he has asked me to speak with his son.  I will try to do that sometime within the next week or 2.  If he wants another opinion about his neck and back we would have to do it outside of the neurosurgery group.  Follow-Up Instructions: Return in about 4 months (around 06/20/2021).   Orders:  No orders of the defined types were placed in this encounter.  Meds ordered this encounter  Medications   gabapentin (NEURONTIN) 300 MG capsule    Sig: Take 300mg  capsule three times daily.    Dispense:  120 capsule    Refill:  3    ZERO refills remain on this prescription. Your patient is requesting advance approval of refills for this medication to PREVENT ANY MISSED DOSES      Procedures: No procedures performed   Clinical Data: No additional findings.  Objective: Vital Signs: There were no vitals  taken for this visit.  Physical Exam:   Constitutional: Patient appears well-developed HEENT:  Head: Normocephalic Eyes:EOM are normal Neck: Normal range of motion Cardiovascular: Normal rate Pulmonary/chest: Effort normal Neurologic: Patient is alert Skin: Skin is warm Psychiatric: Patient has normal mood and affect   Ortho Exam: Ortho exam demonstrates full active and passive range of motion of the hips.  He does have mild effusion right knee.  Lacks that 5 to 7 degrees of full extension.  Collateral and cruciate ligaments are stable.  He has some interosseous wasting in both hands right worse than left.  Neck has limited extension which is not surprising given the levels of fusion required.  He does have good hip flexion  abduction adduction strength.  Quad hamstring strength also pretty reasonable.  Appears to be having some balance issues but does not use any assist with walking.  Plan also to refill his Neurontin per her request.  Specialty Comments:  No specialty comments available.  Imaging: No results found.   PMFS History: Patient Active Problem List   Diagnosis Date Noted   Myelopathy (HCC) 05/14/2019   Dyspnea on exertion 03/05/2017   Mitral regurgitation 03/05/2017   Past Medical History:  Diagnosis Date   Dyspnea    Lymphatic filariasis    Mitral valve regurgitation    Pulmonary hypertension (HCC)    Tricuspid regurgitation     History reviewed. No pertinent family history.  Past Surgical History:  Procedure Laterality Date   POSTERIOR CERVICAL FUSION/FORAMINOTOMY N/A 05/14/2019   Procedure: Posterior Cervical Fusion and decompression with lateral mass fixation - Cervical Three-Four,Cervical Four-Five, Cervical Five-Six, Cervical Six-Seven.;  Surgeon: Donalee Citrin, MD;  Location: [facility] OR;  Service: Neurosurgery;  Laterality: N/A;  posterior   SHOULDER SURGERY     TEE WITHOUT CARDIOVERSION N/A 03/06/2017   Procedure: TRANSESOPHAGEAL ECHOCARDIOGRAM (TEE);  Surgeon: Yates Decamp, MD;  Location: [facility] ENDOSCOPY;  Service: Cardiovascular;  Laterality: N/A;   Social History   Occupational History   Not on file  Tobacco Use   Smoking status: Never   Smokeless tobacco: Never  Vaping Use   Vaping Use: Never used  Substance and Sexual Activity   Alcohol use: No   Drug use: No   Sexual activity: Not on file

## 2021-06-10 ENCOUNTER — Encounter (HOSPITAL_COMMUNITY): Payer: Self-pay | Admitting: General Surgery

## 2021-06-10 NOTE — Progress Notes (Signed)
Anesthesia Chart Review: Keith Fisher   Case: [id number] Date/Time: 06/13/21 1030   Procedure: LAPAROSCOPIC RIGHT INGUINAL HERNIA REPAIR WITH MESH (Right)   Anesthesia type: General   Pre-op diagnosis: RIGHT INGUINAL HERNIA   [facility] ROOM 02 / [facility]   Surgeons: Axel Filler, MD       DISCUSSION: Patient is a 75 year old male scheduled for the above procedure.   History includes never smoker, dyspnea, mitral regurgitation (MVP with moderate MR, stable 04/2019), tricuspid regurgitation (moderate-severe 04/2019), pulmonary hypertension (estimated PASP 49 mm Hg with RA pressure estimated at  8 mmHg by echo 04/2019), lymphatic filariasis (with lymphedema RLE), spinal surgery (C3-7 posterior fusion 05/14/19).Marland Kitchen    He was evaluated by Dr. Jacinto Halim on 04/24/19 for preoperative evaluation for c-spine surgery, but may need lumbosacral surgery as well in the near future. Echo repeated to follow-up MR, TR, pulmonary hypertension. Results felt stable.  Per 05/09/21 note by Dr. Derrell Lolling, "Patient states that he continues to go to the gym and play tennis. He states that he has had some discomfort at times in the right inguinal area. He does hear some "gurgling "sounds in the right inguinal area at times. He states that he is able to reduce the hernia." Hernia appeared larger on exam. Repair recommended.   He speak Hindi. Son Kermit Arnette [phone] was used to assist with interpreting for PAT RN phone interview.   Patient is a same day work-up. Last cardiology note reviewed. Unable to obtain last PCP records given office currently closed. Dr. Derrell Lolling notes describes activity > 4 METS. Patient will be further evaluated on the day of surgery by his assigned anesthesiologist.     VS: Ht 5\' 10"  (1.778 m)   Wt 77.1 kg   BMI 24.39 kg/m     PROVIDERS: , MD is PCP Farris Has, MD is cardiologist    LABS: For day of surgery as indicated.     EKG: Last EKG is > 1 year ago. On 04/24/19, EKG  showed SB at 58 bpm.    CV: Echocardiogram (TTE) 04/29/19 [facility] Cardiovascular):  1. Normal LV systolic function with EF 66%. Left ventricle cavity is normal in size. Mild concentric hypertrophy of the left ventricle. Normal global wall motion. Calculated EF 66%. 2. Left atrial cavity is mildly dilated by volume The interatrial Septum is thin and mobile but appears to be intact by 2D and CF Doppler interrogation. 3. Right atrial cavity is moderately dilated. 4. Right ventricle cavity is mildly dilated. Normal right ventricular function. A moderateor band is noted at the RV apex (normal variant). 5. Mild prolapse of both the mitral valve leaflets. No significant myxomatous degeneration.  Moderate (Grade III) mitral regurgitation. 6.  Mild dilation of the tricuspid valve annulus. Moderate to severe tricuspid regurgitation. Moderate pulmonary hypertension. Estimated pulmonary artery systolic pressure is 49 mm Hg with RA pressure estimated at  8 mm Hg.   7. IVC is normal with blunted respiratory response. This may suggest elevated right heart pressure. 8. Compared to the study done on 02/08/2017, no significant change overall.   TEE 03/06/17: LV: Normal. Normal EF. RV: Borderline enlarged Mildly dilated : Normal. Left atrial appendage: Normal without thrombus. Normal function. Inter atrial septum is intact without defect.  RA: Mildly dilated  MV: Borderline prolapse of the anterior leaflet with posteriorly directed Moderate MR.  TV: Normal Moderate to severe TR, mild to moderate pulmonary hypertension AV: Normal. No AI or AS. PV: Normal. Trace PI.  Thoracic and ascending aorta: Normal without significant plaque or atheromatous changes.   Lexiscan myoview stress test 02/09/17 [facility] Cardiovascular): 1. The resting electrocardiogram demonstrated normal sinus rhythm, normal resting conduction, no resting arrhythmias and normal rest repolarization.  Stress EKG is non-diagnostic for ischemia as it  a pharmacologic stress using Lexiscan. Stress symptoms included dyspnea. 2. Myocardial perfusion imaging is normal. Overall left ventricular systolic function was normal without regional wall motion abnormalities. The left ventricular ejection fraction was 75%.  Past Medical History:  Diagnosis Date   Arthritis    Dyspnea    Lymphatic filariasis    Mitral valve regurgitation    Pulmonary hypertension (HCC)    Tricuspid regurgitation     Past Surgical History:  Procedure Laterality Date   POSTERIOR CERVICAL FUSION/FORAMINOTOMY N/A 05/14/2019   Procedure: Posterior Cervical Fusion and decompression with lateral mass fixation - Cervical Three-Four,Cervical Four-Five, Cervical Five-Six, Cervical Six-Seven.;  Surgeon: Donalee Citrin, MD;  Location: [facility] OR;  Service: Neurosurgery;  Laterality: N/A;  posterior   SHOULDER SURGERY     TEE WITHOUT CARDIOVERSION N/A 03/06/2017   Procedure: TRANSESOPHAGEAL ECHOCARDIOGRAM (TEE);  Surgeon: Yates Decamp, MD;  Location: [facility] ENDOSCOPY;  Service: Cardiovascular;  Laterality: N/A;    MEDICATIONS: No current facility-administered medications for this encounter.    acetaminophen (TYLENOL) 500 MG tablet   gabapentin (NEURONTIN) 300 MG capsule   Multiple Vitamin (MULTIVITAMIN) tablet    Shonna Chock, PA-C Surgical Short Stay/Anesthesiology [facility] Phone [phone] [facility] Phone [phone] 06/10/2021 5:18 PM

## 2021-06-10 NOTE — Progress Notes (Signed)
Spoke with Ignacia Palma [phone] who Speaks English for PAT information and instructions for DOS.  DUE TO COVID-19 ONLY ONE VISITOR IS ALLOWED TO COME WITH YOU AND STAY IN THE WAITING ROOM ONLY DURING PRE OP AND PROCEDURE DAY OF SURGERY.  PCP - Dr Farris Has Cardiologist - n/a  Chest x-ray - n/a EKG - n/a Stress Test - 02/09/17 [facility] ECHO - 04/29/19 Cardiac Cath - n/a  ICD Pacemaker/Loop - n/a  Sleep Study -  n/a CPAP - none  STOP now taking any Aspirin (unless otherwise instructed by your surgeon), Aleve, Naproxen, Ibuprofen, Motrin, Advil, Goody's, BC's, all herbal medications, fish oil, and all vitamins.   Coronavirus Screening Covid test n/a Ambulatory Surgery  Does the patient have any of the following symptoms:  Cough yes/no: No Fever (>100.37F)  yes/no: No Runny nose yes/no: No Sore throat yes/no: No Difficulty breathing/shortness of breath  yes/no: No  Have you traveled in the last 14 days and where? yes/no: No  Son Dispesh verbalized understanding of instructions that were given via phone.

## 2021-06-10 NOTE — Anesthesia Preprocedure Evaluation (Addendum)
Anesthesia Evaluation  Patient identified by MRN, date of birth, ID band Patient awake    Reviewed: Allergy & Precautions, NPO status , Patient's Chart, lab work & pertinent test results  History of Anesthesia Complications Negative for: history of anesthetic complications  Airway Mallampati: II  TM Distance: >3 FB Neck ROM: Full    Dental  (+) Missing,    Pulmonary neg pulmonary ROS,    Pulmonary exam normal        Cardiovascular Normal cardiovascular exam  TTE8/2020:EF 66%, mild LVH, mild LAE, moderate RAE, mildly dilated RV, mild prolapse of both MV leaflets, moderate MR, moderate to severe TR, moderate pulmonary hypertension- PASP 49 mm Hg with RA pressure estimated at 8 mm Hg    Neuro/Psych S/p C3-7 posterior fusion 05/14/19 negative psych ROS   GI/Hepatic negative GI ROS, Neg liver ROS,   Endo/Other  negative endocrine ROS  Renal/GU negative Renal ROS  negative genitourinary   Musculoskeletal  (+) Arthritis ,   Abdominal   Peds  Hematology negative hematology ROS (+)   Anesthesia Other Findings Day of surgery medications reviewed with patient.  Reproductive/Obstetrics negative OB ROS                            Anesthesia Physical Anesthesia Plan  ASA: 3  Anesthesia Plan: General   Post-op Pain Management:    Induction: Intravenous  PONV Risk Score and Plan: 3 and Treatment may vary due to age or medical condition, Ondansetron and Dexamethasone  Airway Management Planned: Oral ETT  Additional Equipment:   Intra-op Plan:   Post-operative Plan: Extubation in OR  Informed Consent: I have reviewed the patients History and Physical, chart, labs and discussed the procedure including the risks, benefits and alternatives for the proposed anesthesia with the patient or authorized representative who has indicated his/her understanding and acceptance.     Dental advisory  given  Plan Discussed with: CRNA  Anesthesia Plan Comments: (PAT note written 06/10/2021 by Shonna Chock, PA-C. )      Anesthesia Quick Evaluation

## 2021-06-11 ENCOUNTER — Ambulatory Visit: Payer: Self-pay | Admitting: General Surgery

## 2021-06-12 NOTE — H&P (Signed)
Chief Complaint: No chief complaint on file.       History of Present Illness: Keith Fisher is a 75 y.o. male who is seen today as an office consultation at the request of Dr. Kateri Plummer for evaluation of No chief complaint on file. .   Patient is a 75 year old male follows back up secondary to right inguinal hernia. Patient states that he continues to go to the gym and play tennis.  He states that he has had some discomfort at times in the right inguinal area.  He does hear some "gurgling "sounds in the right inguinal area at times.  He states that he is able to reduce the hernia.   He had no signs or symptoms of incarceration or strangulation.   Has had no previous abdominal surgery.   Patient did try to reach his son via phone to discuss possibility of surgery however he was unavailable.     Review of Systems: A complete review of systems was obtained from the patient.  I have reviewed this information and discussed as appropriate with the patient.  See HPI as well for other ROS.   Review of Systems  Constitutional: Negative for fever.  HENT: Negative for congestion.   Eyes: Negative for blurred vision.  Respiratory: Negative for cough, shortness of breath and wheezing.   Cardiovascular: Negative for chest pain and palpitations.  Gastrointestinal: Negative for heartburn.  Genitourinary: Negative for dysuria.  Musculoskeletal: Negative for myalgias.  Skin: Negative for rash.  Neurological: Negative for dizziness and headaches.  Psychiatric/Behavioral: Negative for depression and suicidal ideas.  All other systems reviewed and are negative.       Medical History: History reviewed. No pertinent past medical history.   There is no problem list on file for this patient.     History reviewed. No pertinent surgical history.    No Known Allergies   No current outpatient medications on file prior to visit.   No current facility-administered medications on file prior to visit.      History reviewed. No pertinent family history.    Social History   Tobacco Use Smoking Status Never Smoker Smokeless Tobacco Never Used     Social History   Socioeconomic History  Marital status: Single Tobacco Use  Smoking status: Never Smoker  Smokeless tobacco: Never Used Advertising account planner Use: Never used Substance and Sexual Activity  Alcohol use: Yes  Drug use: Defer  Sexual activity: Defer     Objective:     Vitals:   05/09/21 1021 BP: 120/68 Pulse: 51 Temp: 36.7 C (98 F) SpO2: 99% Weight: 81.5 kg (179 lb 9.6 oz) Height: 177.8 cm (5\' 10" )   Body mass index is 25.77 kg/m. Physical Exam Constitutional:      Appearance: Normal appearance.  HENT:     Head: Normocephalic and atraumatic.     Nose: Nose normal. No congestion.     Mouth/Throat:     Mouth: Mucous membranes are moist.     Pharynx: Oropharynx is clear.  Eyes:     Pupils: Pupils are equal, round, and reactive to light.  Cardiovascular:     Rate and Rhythm: Normal rate and regular rhythm.     Pulses: Normal pulses.     Heart sounds: Normal heart sounds. No murmur heard.   No friction rub. No gallop.  Pulmonary:     Effort: Pulmonary effort is normal. No respiratory distress.     Breath sounds: Normal breath sounds. No stridor. No wheezing, rhonchi  or rales.  Abdominal:     General: Abdomen is flat.     Hernia: A hernia is present. Hernia is present in the right inguinal area.  Musculoskeletal:        General: Normal range of motion.     Cervical back: Normal range of motion.  Skin:    General: Skin is warm and dry.  Neurological:     General: No focal deficit present.     Mental Status: He is alert and oriented to person, place, and time.  Psychiatric:        Mood and Affect: Mood normal.        Thought Content: Thought content normal.          Assessment and Plan: Diagnoses and all orders for this visit:   Non-recurrent unilateral inguinal hernia without obstruction or  gangrene     Keith Fisher is a 75 y.o. male    Patient is a 75 year old male with a history of right inguinal hernia. We will proceed with Lap RIHR with mesh All risks and benefits were discussed with the patient, to generally include infection, bleeding, damage to surrounding structures, acute and chronic nerve pain, and recurrence. Alternatives were offered and described.  All questions were answered and the patient voiced understanding of the procedure and wishes to proceed at this point.    Axel Filler, MD, [facility], [place] [facility]

## 2021-06-13 ENCOUNTER — Ambulatory Visit (HOSPITAL_COMMUNITY): Payer: 59 | Admitting: Vascular Surgery

## 2021-06-13 ENCOUNTER — Ambulatory Visit (HOSPITAL_COMMUNITY)
Admission: RE | Admit: 2021-06-13 | Discharge: 2021-06-13 | Disposition: A | Discharge status: Home / Self Care | Payer: 59 | Attending: General Surgery | Admitting: General Surgery

## 2021-06-13 ENCOUNTER — Encounter (HOSPITAL_COMMUNITY): Payer: Self-pay | Admitting: General Surgery

## 2021-06-13 ENCOUNTER — Other Ambulatory Visit: Payer: Self-pay

## 2021-06-13 ENCOUNTER — Encounter (HOSPITAL_COMMUNITY)
Admission: RE | Disposition: A | Discharge status: Home / Self Care | Payer: Self-pay | Source: Home / Self Care | Attending: General Surgery

## 2021-06-13 DIAGNOSIS — I340 Nonrheumatic mitral (valve) insufficiency: Secondary | ICD-10-CM | POA: Diagnosis not present

## 2021-06-13 DIAGNOSIS — K4090 Unilateral inguinal hernia, without obstruction or gangrene, not specified as recurrent: Secondary | ICD-10-CM | POA: Insufficient documentation

## 2021-06-13 HISTORY — PX: INSERTION OF MESH: SHX5868

## 2021-06-13 HISTORY — PX: INGUINAL HERNIA REPAIR: SHX194

## 2021-06-13 HISTORY — DX: Unspecified osteoarthritis, unspecified site: M19.90

## 2021-06-13 LAB — CBC
HCT: 40.6 % (ref 39.0–52.0)
Hemoglobin: 13.6 g/dL (ref 13.0–17.0)
MCH: 31.1 pg (ref 26.0–34.0)
MCHC: 33.5 g/dL (ref 30.0–36.0)
MCV: 92.7 fL (ref 80.0–100.0)
Platelets: 219 10*3/uL (ref 150–400)
RBC: 4.38 MIL/uL (ref 4.22–5.81)
RDW: 12.1 % (ref 11.5–15.5)
WBC: 5.7 10*3/uL (ref 4.0–10.5)
nRBC: 0 % (ref 0.0–0.2)

## 2021-06-13 LAB — BASIC METABOLIC PANEL
Anion gap: 8 (ref 5–15)
BUN: 10 mg/dL (ref 8–23)
CO2: 23 mmol/L (ref 22–32)
Calcium: 9.1 mg/dL (ref 8.9–10.3)
Chloride: 105 mmol/L (ref 98–111)
Creatinine, Ser: 1.14 mg/dL (ref 0.61–1.24)
GFR, Estimated: >60 mL/min (ref >60)
Glucose, Bld: 100 mg/dL — ABNORMAL HIGH (ref 70–99)
Potassium: 4 mmol/L (ref 3.5–5.1)
Sodium: 136 mmol/L (ref 135–145)

## 2021-06-13 SURGERY — REPAIR, HERNIA, INGUINAL, LAPAROSCOPIC
Anesthesia: General | Site: Abdomen | Laterality: Right

## 2021-06-13 MED ORDER — EPHEDRINE 5 MG/ML INJ
INTRAVENOUS | Status: AC
  Filled 2021-06-13: qty 5

## 2021-06-13 MED ORDER — LIDOCAINE 2% (20 MG/ML) 5 ML SYRINGE
INTRAMUSCULAR | Status: DC | PRN
  Administered 2021-06-13: 100 mg via INTRAVENOUS

## 2021-06-13 MED ORDER — BUPIVACAINE-EPINEPHRINE (PF) 0.25% -1:200000 IJ SOLN
INTRAMUSCULAR | Status: AC
  Filled 2021-06-13: qty 30

## 2021-06-13 MED ORDER — ROCURONIUM BROMIDE 10 MG/ML (PF) SYRINGE
PREFILLED_SYRINGE | INTRAVENOUS | Status: DC | PRN
  Administered 2021-06-13: 50 mg via INTRAVENOUS

## 2021-06-13 MED ORDER — GLYCOPYRROLATE PF 0.2 MG/ML IJ SOSY
PREFILLED_SYRINGE | INTRAMUSCULAR | Status: AC
  Filled 2021-06-13: qty 1

## 2021-06-13 MED ORDER — PROPOFOL 10 MG/ML IV BOLUS
INTRAVENOUS | Status: DC | PRN
  Administered 2021-06-13: 130 mg via INTRAVENOUS

## 2021-06-13 MED ORDER — OXYCODONE HCL 5 MG/5ML PO SOLN
5.0000 mg | Freq: Once | ORAL | Status: DC | PRN
Start: 2021-06-13 — End: 2021-06-13

## 2021-06-13 MED ORDER — LIDOCAINE 2% (20 MG/ML) 5 ML SYRINGE
INTRAMUSCULAR | Status: AC
  Filled 2021-06-13: qty 5

## 2021-06-13 MED ORDER — CHLORHEXIDINE GLUCONATE CLOTH 2 % EX PADS: 6.0000 | MEDICATED_PAD | Freq: Once | CUTANEOUS | Status: DC

## 2021-06-13 MED ORDER — LACTATED RINGERS IV SOLN: INTRAVENOUS | Status: DC | PRN

## 2021-06-13 MED ORDER — DEXAMETHASONE SODIUM PHOSPHATE 10 MG/ML IJ SOLN
INTRAMUSCULAR | Status: AC
  Filled 2021-06-13: qty 1

## 2021-06-13 MED ORDER — 0.9 % SODIUM CHLORIDE (POUR BTL) OPTIME
TOPICAL | Status: DC | PRN
  Administered 2021-06-13: 1000 mL

## 2021-06-13 MED ORDER — ROCURONIUM BROMIDE 10 MG/ML (PF) SYRINGE
PREFILLED_SYRINGE | INTRAVENOUS | Status: AC
  Filled 2021-06-13: qty 10

## 2021-06-13 MED ORDER — FENTANYL CITRATE (PF) 100 MCG/2ML IJ SOLN
INTRAMUSCULAR | Status: DC | PRN
  Administered 2021-06-13: 100 ug via INTRAVENOUS

## 2021-06-13 MED ORDER — EPHEDRINE SULFATE 50 MG/ML IJ SOLN
INTRAMUSCULAR | Status: DC | PRN
  Administered 2021-06-13 (×4): 5 mg via INTRAVENOUS

## 2021-06-13 MED ORDER — CHLORHEXIDINE GLUCONATE 0.12 % MT SOLN
15.0000 mL | Freq: Once | OROMUCOSAL | Status: AC
  Administered 2021-06-13: 15 mL via OROMUCOSAL
  Filled 2021-06-13: qty 15

## 2021-06-13 MED ORDER — ORAL CARE MOUTH RINSE: 15.0000 mL | Freq: Once | OROMUCOSAL | Status: AC

## 2021-06-13 MED ORDER — ONDANSETRON HCL 4 MG/2ML IJ SOLN
INTRAMUSCULAR | Status: AC
  Filled 2021-06-13: qty 2

## 2021-06-13 MED ORDER — GLYCOPYRROLATE PF 0.2 MG/ML IJ SOSY
PREFILLED_SYRINGE | INTRAMUSCULAR | Status: DC | PRN
  Administered 2021-06-13: .2 mg via INTRAVENOUS

## 2021-06-13 MED ORDER — FENTANYL CITRATE (PF) 100 MCG/2ML IJ SOLN: 25.0000 ug | INTRAMUSCULAR | Status: DC | PRN

## 2021-06-13 MED ORDER — PROPOFOL 10 MG/ML IV BOLUS
INTRAVENOUS | Status: AC
  Filled 2021-06-13: qty 20

## 2021-06-13 MED ORDER — LACTATED RINGERS IV SOLN: INTRAVENOUS | Status: DC

## 2021-06-13 MED ORDER — DEXAMETHASONE SODIUM PHOSPHATE 10 MG/ML IJ SOLN
INTRAMUSCULAR | Status: DC | PRN
  Administered 2021-06-13: 10 mg via INTRAVENOUS

## 2021-06-13 MED ORDER — CEFAZOLIN SODIUM-DEXTROSE 2-4 GM/100ML-% IV SOLN
2.0000 g | INTRAVENOUS | Status: AC
  Administered 2021-06-13: 2 g via INTRAVENOUS
  Filled 2021-06-13: qty 100

## 2021-06-13 MED ORDER — OXYCODONE HCL 5 MG PO TABS: 5.0000 mg | ORAL_TABLET | Freq: Once | ORAL | Status: DC | PRN

## 2021-06-13 MED ORDER — TRAMADOL HCL 50 MG PO TABS: 50.0000 mg | ORAL_TABLET | Freq: Four times a day (QID) | ORAL | 0 refills | Status: DC | PRN

## 2021-06-13 MED ORDER — ENSURE PRE-SURGERY PO LIQD: 296.0000 mL | Freq: Once | ORAL | Status: DC

## 2021-06-13 MED ORDER — BUPIVACAINE-EPINEPHRINE (PF) 0.25% -1:200000 IJ SOLN
INTRAMUSCULAR | Status: DC | PRN
  Administered 2021-06-13: 5 mL

## 2021-06-13 MED ORDER — ACETAMINOPHEN 500 MG PO TABS
1000.0000 mg | ORAL_TABLET | ORAL | Status: AC
  Administered 2021-06-13: 1000 mg via ORAL
  Filled 2021-06-13: qty 2

## 2021-06-13 MED ORDER — MIDAZOLAM HCL 2 MG/2ML IJ SOLN
INTRAMUSCULAR | Status: AC
  Filled 2021-06-13: qty 2

## 2021-06-13 MED ORDER — FENTANYL CITRATE (PF) 250 MCG/5ML IJ SOLN
INTRAMUSCULAR | Status: AC
  Filled 2021-06-13: qty 5

## 2021-06-13 MED ORDER — ONDANSETRON HCL 4 MG/2ML IJ SOLN
INTRAMUSCULAR | Status: DC | PRN
  Administered 2021-06-13: 4 mg via INTRAVENOUS

## 2021-06-13 MED ORDER — SUGAMMADEX SODIUM 200 MG/2ML IV SOLN
INTRAVENOUS | Status: DC | PRN
  Administered 2021-06-13: 200 mg via INTRAVENOUS

## 2021-06-13 MED ORDER — MIDAZOLAM HCL 5 MG/5ML IJ SOLN
INTRAMUSCULAR | Status: DC | PRN
  Administered 2021-06-13: 1 mg via INTRAVENOUS

## 2021-06-13 SURGICAL SUPPLY — 42 items
BAG COUNTER SPONGE SURGICOUNT (BAG) ×2 IMPLANT
CANISTER SUCT 3000ML PPV (MISCELLANEOUS) IMPLANT
COVER SURGICAL LIGHT HANDLE (MISCELLANEOUS) ×2 IMPLANT
DERMABOND ADVANCED (GAUZE/BANDAGES/DRESSINGS) ×1
DERMABOND ADVANCED .7 DNX12 (GAUZE/BANDAGES/DRESSINGS) ×1 IMPLANT
DISSECTOR BLUNT TIP ENDO 5MM (MISCELLANEOUS) IMPLANT
ELECT REM PT RETURN 9FT ADLT (ELECTROSURGICAL) ×2
ELECTRODE REM PT RTRN 9FT ADLT (ELECTROSURGICAL) ×1 IMPLANT
GLOVE SURG ENC MOIS LTX SZ7.5 (GLOVE) ×2 IMPLANT
GOWN STRL REUS W/ TWL LRG LVL3 (GOWN DISPOSABLE) ×2 IMPLANT
GOWN STRL REUS W/ TWL XL LVL3 (GOWN DISPOSABLE) ×1 IMPLANT
GOWN STRL REUS W/TWL LRG LVL3 (GOWN DISPOSABLE) ×2
GOWN STRL REUS W/TWL XL LVL3 (GOWN DISPOSABLE) ×1
KIT BASIN OR (CUSTOM PROCEDURE TRAY) ×2 IMPLANT
KIT TURNOVER KIT B (KITS) ×2 IMPLANT
MESH 3DMAX 5X7 RT XLRG (Mesh General) ×1 IMPLANT
NDL INSUFFLATION 14GA 120MM (NEEDLE) IMPLANT
NEEDLE INSUFFLATION 14GA 120MM (NEEDLE) IMPLANT
NS IRRIG 1000ML POUR BTL (IV SOLUTION) ×2 IMPLANT
PAD ARMBOARD 7.5X6 YLW CONV (MISCELLANEOUS) ×4 IMPLANT
RELOAD STAPLE 4.0 BLU F/HERNIA (INSTRUMENTS) IMPLANT
RELOAD STAPLE 4.8 BLK F/HERNIA (STAPLE) IMPLANT
RELOAD STAPLE HERNIA 4.0 BLUE (INSTRUMENTS) ×2 IMPLANT
RELOAD STAPLE HERNIA 4.8 BLK (STAPLE) IMPLANT
SCISSORS LAP 5X35 DISP (ENDOMECHANICALS) ×2 IMPLANT
SET IRRIG TUBING LAPAROSCOPIC (IRRIGATION / IRRIGATOR) IMPLANT
SET TROCAR LAP APPLE-HUNT 5MM (ENDOMECHANICALS) ×1 IMPLANT
SET TUBE SMOKE EVAC HIGH FLOW (TUBING) ×2 IMPLANT
STAPLER HERNIA 12 8.5 360D (INSTRUMENTS) ×1 IMPLANT
SUT MNCRL AB 4-0 PS2 18 (SUTURE) ×2 IMPLANT
SUT VIC AB 1 CT1 27 (SUTURE)
SUT VIC AB 1 CT1 27XBRD ANBCTR (SUTURE) IMPLANT
SYRINGE TOOMEY DISP (SYRINGE) ×2 IMPLANT
TOWEL GREEN STERILE (TOWEL DISPOSABLE) ×2 IMPLANT
TOWEL GREEN STERILE FF (TOWEL DISPOSABLE) ×2 IMPLANT
TRAY FOLEY W/BAG SLVR 14FR (SET/KITS/TRAYS/PACK) ×2 IMPLANT
TRAY LAPAROSCOPIC MC (CUSTOM PROCEDURE TRAY) ×2 IMPLANT
TROCAR OPTICAL SHORT 5MM (TROCAR) ×1 IMPLANT
TROCAR OPTICAL SLV SHORT 5MM (TROCAR) ×1 IMPLANT
TROCAR XCEL 12X100 BLDLESS (ENDOMECHANICALS) ×2 IMPLANT
WARMER LAPAROSCOPE (MISCELLANEOUS) ×2 IMPLANT
WATER STERILE IRR 1000ML POUR (IV SOLUTION) ×2 IMPLANT

## 2021-06-13 NOTE — Anesthesia Postprocedure Evaluation (Signed)
Anesthesia Post Note  Patient: Keith Fisher  Procedure(s) Performed: LAPAROSCOPIC RIGHT INGUINAL HERNIA REPAIR WITH MESH (Right: Abdomen) INSERTION OF MESH (Right: Abdomen)     Patient location during evaluation: PACU Anesthesia Type: General Level of consciousness: awake and alert and oriented Pain management: pain level controlled Vital Signs Assessment: post-procedure vital signs reviewed and stable Respiratory status: spontaneous breathing, nonlabored ventilation and respiratory function stable Cardiovascular status: blood pressure returned to baseline Postop Assessment: no apparent nausea or vomiting Anesthetic complications: no   No notable events documented.  Last Vitals:  Vitals:   06/13/21 1150 06/13/21 1205  BP: (!) 145/55 (!) 153/65  Pulse: (!) 48 (!) 47  Resp: 13 14  Temp:    SpO2: 100% 100%    Last Pain:  Vitals:   06/13/21 1205  TempSrc:   PainSc: 0-No pain                 Shanda Howells

## 2021-06-13 NOTE — Op Note (Addendum)
06/13/2021  11:19 AM  PATIENT:  Keith Fisher  75 y.o. male  PRE-OPERATIVE DIAGNOSIS:  RIGHT INGUINAL HERNIA  POST-OPERATIVE DIAGNOSIS:  RIGHT INDIRECT INGUINAL HERNIA  PROCEDURE:  Procedure(s): LAPAROSCOPIC RIGHT INGUINAL HERNIA REPAIR WITH MESH (Right) INSERTION OF MESH (Right)  SURGEON:  Surgeon(s) and Role:    * Axel Filler, MD - Primary   ASSISTANTS: Basilio Cairo, MD PGY-3   ANESTHESIA:   local and general  EBL:  minimal   BLOOD ADMINISTERED:none  DRAINS: none   LOCAL MEDICATIONS USED:  BUPIVICAINE   SPECIMEN:  No Specimen  DISPOSITION OF SPECIMEN:  N/A  COUNTS:  YES  TOURNIQUET:  * No tourniquets in log *  DICTATION: .Dragon Dictation  Counts: reported as correct x 2  Findings:  The patient had a large right indirect hernia   Indications for procedure:  The patient is a 75 year old male with a right inguinal hernia for several months. Patient complained of symptomatology to his right inguinal area. The patient was taken back for elective inguinal hernia repair.  Details of the procedure: The patient was taken back to the operating room. The patient was placed in supine position with bilateral SCDs in place.  The patient was prepped and draped in the usual sterile fashion.  After appropriate anitbiotics were confirmed, a time-out was confirmed and all facts were verified.  0.25% Marcaine was used to infiltrate the umbilical area. A 11-blade was used to cut down the skin and blunt dissection was used to get the anterior fashion.  The anterior fascia was incised approximately 1 cm and the muscles were retracted laterally. Blunt dissection was then used to create a space in the preperitoneal area. At this time a 10 mm camera was then introduced into the space and advanced the pubic tubercle and a 12 mm trocar was placed over this and insufflation was started.  At this time and space was created from medial to laterally the preperitoneal space.  Cooper's ligament  was initially cleaned off.  The hernia sac was identified in the indirect space. Dissection of the hernia sac and cord structures was undertaken the vas deferens was identified and protected in all parts of the case.  There was some scar tissue medially over the iliac vein.  This was dissected away so the mesh could lay flat.  Once the hernia sac was taken down to approximately the umbilicus a Bard 3D Max mesh, size: Barney Drain, was  introduced into the preperitoneal space.  The mesh was brought over to cover the direct and indirect hernia spaces.  This was anchored into place and secured to Cooper's ligament with 4.34mm staples from a Coviden hernia stapler. It was anchored to the anterior abdominal wall with 4.8 mm staples. The hernia sac was seen lying posterior to the mesh. There was no staples placed laterally. The insufflation was evacuated and the peritoneum was seen posterior to the mesh. The trochars were removed. The anterior fascia was reapproximated using #1 Vicryl on a UR- 6.  Intra-abdominal air was evacuated and the Veress needle removed. The skin was reapproximated using 4-0 Monocryl subcuticular fashion and Dermabond. The patient was awakened from general anesthesia and taken to recovery in stable condition.  I was personally present during the key and critical portions of this procedure and immediately available throughout the entire procedure, as documented in my operative note.   PLAN OF CARE: Discharge to home after PACU  PATIENT DISPOSITION:  PACU - hemodynamically stable.   Delay start of Pharmacological  VTE agent (>24hrs) due to surgical blood loss or risk of bleeding: not applicable

## 2021-06-13 NOTE — Discharge Instructions (Signed)
[facility] _______[facility] ? ?INGUINAL HERNIA REPAIR: POST OP INSTRUCTIONS ? ?Always review your discharge instruction sheet given to you by the facility where your surgery was performed. ?IF YOU HAVE DISABILITY OR FAMILY LEAVE FORMS, YOU MUST BRING THEM TO THE OFFICE FOR PROCESSING.   ?DO NOT GIVE THEM TO YOUR DOCTOR. ? ?1. A  prescription for pain medication may be given to you upon discharge.  Take your pain medication as prescribed, if needed.  If narcotic pain medicine is not needed, then you may take acetaminophen (Tylenol) or ibuprofen (Advil) as needed. ?2. Take your usually prescribed medications unless otherwise directed. ?If you need a refill on your pain medication, please contact your pharmacy.  They will contact our office to request authorization. Prescriptions will not be filled after 5 pm or on week-ends. ?3. You should follow a light diet the first 24 hours after arrival home, such as soup and crackers, etc.  Be sure to include lots of fluids daily.  Resume your normal diet the day after surgery. ?4.Most patients will experience some swelling and bruising around the umbilicus or in the groin and scrotum.  Ice packs and reclining will help.  Swelling and bruising can take several days to resolve.  ?6. It is common to experience some constipation if taking pain medication after surgery.  Increasing fluid intake and taking a stool softener (such as Colace) will usually help or prevent this problem from occurring.  A mild laxative (Milk of Magnesia or Miralax) should be taken according to package directions if there are no bowel movements after 48 hours. ?7. Unless discharge instructions indicate otherwise, you may remove your bandages 24-48 hours after surgery, and you may shower at that time.  You may have steri-strips (small skin tapes) in place directly over the incision.  These strips should be left on the skin for 7-10 days.  If your surgeon used skin glue on the incision, you may  shower in 24 hours.  The glue will flake off over the next 2-3 weeks.  Any sutures or staples will be removed at the office during your follow-up visit. ?8. ACTIVITIES:  You may resume regular (light) daily activities beginning the next day--such as daily self-care, walking, climbing stairs--gradually increasing activities as tolerated.  You may have sexual intercourse when it is comfortable.  Refrain from any heavy lifting or straining until approved by your doctor. ? ?a.You may drive when you are no longer taking prescription pain medication, you can comfortably wear a seatbelt, and you can safely maneuver your car and apply brakes. ?b.RETURN TO WORK:   ?_____________________________________________ ? ?9.You should see your doctor in the office for a follow-up appointment approximately 2-3 weeks after your surgery.  Make sure that you call for this appointment within a day or two after you arrive home to insure a convenient appointment time. ?10.OTHER INSTRUCTIONS: _________________________ ?   _____________________________________ ? ?WHEN TO CALL YOUR DOCTOR: ?Fever over 101.0 ?Inability to urinate ?Nausea and/or vomiting ?Extreme swelling or bruising ?Continued bleeding from incision. ?Increased pain, redness, or drainage from the incision ? ?The clinic staff is available to answer your questions during regular business hours.  Please don?t hesitate to call and ask to speak to one of the nurses for clinical concerns.  If you have a medical emergency, go to the nearest emergency room or call 911.  A surgeon from [facility] is always on call at the hospital ? ? ?[address], [address], [place], [place]    [address] ? ? [address] [address], [place], [place]   [address] ?[phone] ? [phone] ? FAX [phone] ?Web site: [email or web address] ? ?

## 2021-06-13 NOTE — Anesthesia Procedure Notes (Signed)
Procedure Name: Intubation Date/Time: 06/13/2021 10:47 AM Performed by: Kerry Fort, CRNA Pre-anesthesia Checklist: Patient identified, Emergency Drugs available, Suction available and Patient being monitored Patient Re-evaluated:Patient Re-evaluated prior to induction Oxygen Delivery Method: Circle system utilized Preoxygenation: Pre-oxygenation with 100% oxygen Induction Type: IV induction Ventilation: Mask ventilation without difficulty Laryngoscope Size: Glidescope, Mac and 4 Grade View: Grade I Tube type: Oral Tube size: 7.5 mm Number of attempts: 1 Airway Equipment and Method: Stylet and Video-laryngoscopy Placement Confirmation: ETT inserted through vocal cords under direct vision, positive ETCO2, breath sounds checked- equal and bilateral and CO2 detector Secured at: 22 cm Tube secured with: Tape Dental Injury: Teeth and Oropharynx as per pre-operative assessment

## 2021-06-13 NOTE — Interval H&P Note (Signed)
History and Physical Interval Note:  06/13/2021 9:51 AM  Keith Fisher  has presented today for surgery, with the diagnosis of RIGHT INGUINAL HERNIA.  The various methods of treatment have been discussed with the patient and family. After consideration of risks, benefits and other options for treatment, the patient has consented to  Procedure(s): LAPAROSCOPIC RIGHT INGUINAL HERNIA REPAIR WITH MESH (Right) as a surgical intervention.  [name] history has been reviewed, patient examined, no change in status, stable for surgery.  I have reviewed the patient's chart and labs.  Questions were answered to the patient's satisfaction.     Axel Filler

## 2021-06-13 NOTE — Transfer of Care (Signed)
Immediate Anesthesia Transfer of Care Note  Patient: Keith Fisher  Procedure(s) Performed: LAPAROSCOPIC RIGHT INGUINAL HERNIA REPAIR WITH MESH (Right: Abdomen) INSERTION OF MESH (Right: Abdomen)  Patient Location: PACU  Anesthesia Type:General  Level of Consciousness: awake, alert  and oriented  Airway & Oxygen Therapy: Patient Spontanous Breathing and Patient connected to nasal cannula oxygen  Post-op Assessment: Report given to RN and Post -op Vital signs reviewed and stable  Post vital signs: Reviewed and stable  Last Vitals:  Vitals Value Taken Time  BP 150/63 06/13/21 1133  Temp    Pulse 55 06/13/21 1132  Resp 15 06/13/21 1134  SpO2 100 % 06/13/21 1132  Vitals shown include unvalidated device data.  Last Pain:  Vitals:   06/13/21 0911  TempSrc:   PainSc: 0-No pain         Complications: No notable events documented.

## 2021-06-14 ENCOUNTER — Encounter (HOSPITAL_COMMUNITY): Payer: Self-pay | Admitting: General Surgery

## 2021-06-24 ENCOUNTER — Other Ambulatory Visit: Payer: Self-pay | Admitting: Orthopedic Surgery

## 2021-06-24 DIAGNOSIS — M4803 Spinal stenosis, cervicothoracic region: Secondary | ICD-10-CM

## 2021-08-15 ENCOUNTER — Telehealth: Payer: Self-pay | Admitting: Orthopedic Surgery

## 2021-08-15 DIAGNOSIS — M4803 Spinal stenosis, cervicothoracic region: Secondary | ICD-10-CM

## 2021-08-15 MED ORDER — GABAPENTIN 300 MG PO CAPS: ORAL_CAPSULE | ORAL | 3 refills | Status: DC

## 2021-08-15 NOTE — Telephone Encounter (Signed)
Sent to pharmacy-will call to advise patient.

## 2021-08-15 NOTE — Telephone Encounter (Signed)
I called patient's son and advised.

## 2021-08-15 NOTE — Telephone Encounter (Signed)
Patient's son called. Patient would like a refill on gabapentin called in. Call back number is [phone]

## 2021-10-11 NOTE — Progress Notes (Signed)
Primary Physician/Referring:  Farris Has, MD  Patient ID: Keith Fisher, male    DOB: [birth year], 76 y.o.   MRN: [id number]  Chief Complaint  Patient presents with   Atrial Fibrillation   New Patient (Initial Visit)    Referred by Farris Has, MD   HPI:    Keith Fisher  is a 76 y.o. with history of filaria since and has chronic lymphedema involving his right leg, mild chronic dyspnea, mitral valve prolapse with moderate to severe mitral regurgitation by echocardiogram in 2020, fairly active and chronic bradycardia asymptomatic by EKG now referred to me for evaluation of new onset atrial fibrillation, last seen by me in 2018.  Patient was seen by his PCP last week for neuropathic pain and at that time was found to have A. fib with RVR, started on Xarelto and referred to me.  Patient remains asymptomatic without any fatigue or dyspnea or chest pain.  Past Medical History:  Diagnosis Date   Arthritis    Dyspnea    Lymphatic filariasis    Mitral valve regurgitation    Pulmonary hypertension (HCC)    Tricuspid regurgitation    Past Surgical History:  Procedure Laterality Date   INGUINAL HERNIA REPAIR Right 06/13/2021   Procedure: LAPAROSCOPIC RIGHT INGUINAL HERNIA REPAIR WITH MESH;  Surgeon: Axel Filler, MD;  Location: [facility] OR;  Service: General;  Laterality: Right;   INSERTION OF MESH Right 06/13/2021   Procedure: INSERTION OF MESH;  Surgeon: Axel Filler, MD;  Location: [facility] OR;  Service: General;  Laterality: Right;   POSTERIOR CERVICAL FUSION/FORAMINOTOMY N/A 05/14/2019   Procedure: Posterior Cervical Fusion and decompression with lateral mass fixation - Cervical Three-Four,Cervical Four-Five, Cervical Five-Six, Cervical Six-Seven.;  Surgeon: Donalee Citrin, MD;  Location: [facility] OR;  Service: Neurosurgery;  Laterality: N/A;  posterior   SHOULDER SURGERY     TEE WITHOUT CARDIOVERSION N/A 03/06/2017   Procedure: TRANSESOPHAGEAL ECHOCARDIOGRAM (TEE);  Surgeon: Yates Decamp, MD;  Location: [facility]  ENDOSCOPY;  Service: Cardiovascular;  Laterality: N/A;   History reviewed. No pertinent family history.  Social History   Tobacco Use   Smoking status: Never   Smokeless tobacco: Never  Substance Use Topics   Alcohol use: No   Marital Status: Married  ROS  Review of Systems  Cardiovascular:  Negative for chest pain, dyspnea on exertion and leg swelling.  Objective  Blood pressure 132/83, pulse 91, temperature (!) 97.4 F (36.3 C), temperature source Temporal, resp. rate 16, height 5\' 10"  (1.778 m), weight 189 lb (85.7 kg), SpO2 99 %. Body mass index is 27.12 kg/m.  Vitals with BMI 10/12/2021 06/13/2021 06/13/2021  Height 5\' 10"  - -  Weight 189 lbs - -  BMI 27.12 - -  Systolic 132 153 409  Diastolic 83 65 55  Pulse 91 47 48    Physical Exam Neck:     Vascular: No carotid bruit or JVD.  Cardiovascular:     Rate and Rhythm: Tachycardia present. Rhythm irregular.     Pulses: Normal pulses and intact distal pulses.     Heart sounds: No murmur heard.   No gallop. No S3 or S4 sounds.  Pulmonary:     Effort: Pulmonary effort is normal.     Breath sounds: Normal breath sounds.  Abdominal:     General: Bowel sounds are normal.     Palpations: Abdomen is soft.  Musculoskeletal:     Right lower leg: No edema.     Left lower leg: No edema.  Skin:    Capillary Refill: Capillary refill takes less than 2 seconds.     Medications and allergies  No Known Allergies   Medication prior to this encounter:   Outpatient Medications Prior to Visit  Medication Sig Dispense Refill   acetaminophen (TYLENOL) 500 MG tablet Take 500 mg by mouth every 6 (six) hours as needed for moderate pain.     Multiple Vitamin (MULTIVITAMIN) tablet Take 1 tablet by mouth once a week.     traMADol (ULTRAM) 50 MG tablet Take 1 tablet (50 mg total) by mouth every 6 (six) hours as needed. 20 tablet 0   gabapentin (NEURONTIN) 300 MG capsule TAKE 1 CAPSULE BY MOUTH THREE TIMES DAILY 120 capsule 3   XARELTO 20 MG  TABS tablet Take 20 mg by mouth daily.     No facility-administered medications prior to visit.     Medication list after today's encounter   Current Outpatient Medications  Medication Instructions   acetaminophen (TYLENOL) 500 mg, Oral, Every 6 hours PRN   gabapentin (NEURONTIN) 400 MG capsule TAKE 1 CAPSULE BY MOUTH THREE TIMES DAILY   metoprolol tartrate (LOPRESSOR) 50 mg, Oral, 2 times daily   Multiple Vitamin (MULTIVITAMIN) tablet 1 tablet, Oral, Weekly   traMADol (ULTRAM) 50 mg, Oral, Every 6 hours PRN   Xarelto 20 mg, Oral, Daily with supper    Laboratory examination:   Recent Labs    06/13/21 0839  NA 136  K 4.0  CL 105  CO2 23  GLUCOSE 100*  BUN 10  CREATININE 1.14  CALCIUM 9.1  GFRNONAA >60   CrCl cannot be calculated (Patient's most recent lab result is older than the maximum 21 days allowed.).  CMP Latest Ref Rng & Units 06/13/2021 05/07/2019  Glucose 70 - 99 mg/dL 100(H) 137(H)  BUN 8 - 23 mg/dL 10 14  Creatinine 0.61 - 1.24 mg/dL 1.14 1.29(H)  Sodium 135 - 145 mmol/L 136 139  Potassium 3.5 - 5.1 mmol/L 4.0 4.5  Chloride 98 - 111 mmol/L 105 105  CO2 22 - 32 mmol/L 23 26  Calcium 8.9 - 10.3 mg/dL 9.1 9.1   CBC Latest Ref Rng & Units 06/13/2021 05/07/2019  WBC 4.0 - 10.5 K/uL 5.7 6.4  Hemoglobin 13.0 - 17.0 g/dL 13.6 13.2  Hematocrit 39.0 - 52.0 % 40.6 40.1  Platelets 150 - 400 K/uL 219 232   External labs:   Labs 09/27/2021:  A1c 6.3%.  TSH 1.40, normal.  Hb 13.2/HCT 31.3, platelets 240, normal indicis.  Serum glucose 99 mg, BUN 16, creatinine 1.14, potassium 4.9, LFTs normal, CMP otherwise normal.  Lipid profile from 2016: Total cholesterol 140, triglycerides 93, HDL 54, non-HDL cholesterol 87.  Radiology:   No results found.  Cardiac Studies:   TEE   [03/06/2017]:  Normal LV systolic function. Mild prolapse of the anterior MV leaflet with posteriorly directed Moderate MR. Moderate TR with moderate pulmonary hypertension. Mild biatrial  enlargement and mild RV dilatation. Normal thoracic aorta.  Nuclear stress test   [02/09/2017]:  1. The resting electrocardiogram demonstrated normal sinus rhythm, normal resting conduction, no resting arrhythmias and normal rest repolarization. Stress EKG is non-diagnostic for ischemia as it a pharmacologic stress using Lexiscan. Stress symptoms included dyspnea. 2. Myocardial perfusion imaging is normal. Overall left ventricular systolic function was normal without regional wall motion abnormalities. The left ventricular ejection fraction was 75%.  PCV ECHOCARDIOGRAM COMPLETE 04/29/2019  Narrative Echocardiogram 04/29/2019 : 1. Normal LV systolic function with EF 66%. Left ventricle cavity  is normal in size. Mild concentric hypertrophy of the left ventricle. Normal global wall motion. Calculated EF 66%. 2. Left atrial cavity is mildly dilated by volume The interatrial Septum is thin and mobile but appears to be intact by 2D and CF Doppler interrogation. 3. Right atrial cavity is moderately dilated. 4. Right ventricle cavity is mildly dilated. Normal right ventricular function. A moderateor band is noted at the RV apex (normal variant). 5. Mild prolapse of both the mitral valve leaflets. No significant myxomatous degeneration.  Moderate (Grade III) mitral regurgitation. 6.  Mild dilation of the tricuspid valve annulus. Moderate to severe tricuspid regurgitation. Moderate pulmonary hypertension. Estimated pulmonary artery systolic pressure is 49 mm Hg with RA pressure estimated at  8 mm Hg. 7. IVC is normal with blunted respiratory response. This may suggest elevated right heart pressure. 8. Compared to the study done on 02/08/2017, no significant change overall.    EKG:   EKG 10/12/2021: Atrial fibrillation with rapid ventricular sponsor at rate of 118 bpm, low voltage complexes, nonspecific T abnormality.    Assessment     ICD-10-CM   1. New onset atrial fibrillation (HCC)  I48.91 EKG  12-Lead    metoprolol tartrate (LOPRESSOR) 50 MG tablet    XARELTO 20 MG TABS tablet    PCV ECHOCARDIOGRAM COMPLETE    2. Neuropathic pain  M79.2     3. Spinal stenosis of cervicothoracic region  M48.03 gabapentin (NEURONTIN) 400 MG capsule    Ambulatory referral to Neurosurgery       Medications Discontinued During This Encounter  Medication Reason   gabapentin (NEURONTIN) 300 MG capsule Reorder   XARELTO 20 MG TABS tablet Reorder    Meds ordered this encounter  Medications   metoprolol tartrate (LOPRESSOR) 50 MG tablet    Sig: Take 1 tablet (50 mg total) by mouth 2 (two) times daily.    Dispense:  60 tablet    Refill:  2   gabapentin (NEURONTIN) 400 MG capsule    Sig: TAKE 1 CAPSULE BY MOUTH THREE TIMES DAILY    Dispense:  240 capsule    Refill:  1    ZERO refills remain on this prescription. Your patient is requesting advance approval of refills for this medication to [place] 20 MG TABS tablet    Sig: Take 1 tablet (20 mg total) by mouth daily with supper.    Dispense:  30 tablet    Refill:  3   Orders Placed This Encounter  Procedures   Ambulatory referral to Neurosurgery    Referral Priority:   Routine    Referral Type:   Surgical    Referral Reason:   Specialty Services Required    Referred to Provider:   Kary Kos, MD    Requested Specialty:   Neurosurgery    Number of Visits Requested:   1   EKG 12-Lead   PCV ECHOCARDIOGRAM COMPLETE    Standing Status:   Future    Standing Expiration Date:   12/10/2021    Scheduling Instructions:     Schedule this in 2 weeks   Recommendations:   Keith Fisher is a 76 y.o. Patient Race: Asian [4] male with history of filaria since and has chronic lymphedema involving his right leg, mild chronic dyspnea, mitral valve prolapse with moderate to severe mitral regurgitation by echocardiogram in 2020, fairly active and chronic bradycardia asymptomatic by EKG now referred to me for evaluation of new onset  atrial fibrillation,  last seen by me in 2018.  Patient was seen by his PCP last week for neuropathic pain and at that time was found to have A. fib with RVR, started on Xarelto and referred to me.  Patient remains asymptomatic without any fatigue or dyspnea or chest pain.  Although I could not appreciate any murmur, he had moderate mitral regurgitation previously and wonder whether this has progressed leading to A. fib or whether it is age-related atrial fibrillation.  I will start him on metoprolol tartrate 50 mg twice daily for rate control.  His cardioembolic risk is low and [id number] risk is 1.0.  He could potentially come off of anticoagulation once I decide upon rate control versus rhythm control.  I would like to repeat an echocardiogram in 2 weeks once his heart rate is improved on beta-blocker therapy and I would like to see him back in 3 weeks for follow-up.  With regard to neuropathic pain, but had last seen him 3 to 4 years ago and prescribed him Neurontin which she is doing well with this but request that I increase the strength of the prescription.  I increased it to 400 mg 3 times daily.  I am skeptical of using such high dose, I am also noticing that he has wasting of his upper extremities as well, he had seen Dr. Kary Kos from neurologic standpoint, I will refer him back for reevaluation.    Adrian Prows, MD, [facility] 10/12/2021, 10:56 AM Office: [phone]

## 2021-10-12 ENCOUNTER — Encounter: Payer: Self-pay | Admitting: Cardiology

## 2021-10-12 ENCOUNTER — Other Ambulatory Visit: Payer: Self-pay

## 2021-10-12 ENCOUNTER — Ambulatory Visit: Payer: 59 | Admitting: Cardiology

## 2021-10-12 VITALS — BP 132/83 | HR 91 | Temp 97.4°F | Resp 16 | Ht 70.0 in | Wt 189.0 lb

## 2021-10-12 DIAGNOSIS — M792 Neuralgia and neuritis, unspecified: Secondary | ICD-10-CM

## 2021-10-12 DIAGNOSIS — I4891 Unspecified atrial fibrillation: Secondary | ICD-10-CM

## 2021-10-12 DIAGNOSIS — M4803 Spinal stenosis, cervicothoracic region: Secondary | ICD-10-CM

## 2021-10-12 MED ORDER — GABAPENTIN 400 MG PO CAPS: ORAL_CAPSULE | ORAL | 1 refills | Status: DC

## 2021-10-12 MED ORDER — METOPROLOL TARTRATE 50 MG PO TABS: 50.0000 mg | ORAL_TABLET | Freq: Two times a day (BID) | ORAL | 2 refills | Status: DC

## 2021-10-12 MED ORDER — XARELTO 20 MG PO TABS: 20.0000 mg | ORAL_TABLET | Freq: Every day | ORAL | 3 refills | Status: DC

## 2021-10-27 ENCOUNTER — Other Ambulatory Visit: Payer: Self-pay

## 2021-10-27 ENCOUNTER — Ambulatory Visit: Payer: 59

## 2021-10-27 DIAGNOSIS — I4891 Unspecified atrial fibrillation: Secondary | ICD-10-CM

## 2021-11-03 ENCOUNTER — Ambulatory Visit: Payer: 59 | Admitting: Cardiology

## 2021-11-03 ENCOUNTER — Other Ambulatory Visit: Payer: Self-pay

## 2021-11-03 ENCOUNTER — Encounter: Payer: Self-pay | Admitting: Cardiology

## 2021-11-03 VITALS — BP 117/77 | HR 71 | Temp 98.3°F | Resp 16 | Ht 70.0 in | Wt 191.9 lb

## 2021-11-03 DIAGNOSIS — I071 Rheumatic tricuspid insufficiency: Secondary | ICD-10-CM

## 2021-11-03 DIAGNOSIS — I340 Nonrheumatic mitral (valve) insufficiency: Secondary | ICD-10-CM

## 2021-11-03 DIAGNOSIS — I4891 Unspecified atrial fibrillation: Secondary | ICD-10-CM

## 2021-11-03 MED ORDER — METOPROLOL TARTRATE 50 MG PO TABS: 25.0000 mg | ORAL_TABLET | Freq: Two times a day (BID) | ORAL | 2 refills | Status: DC

## 2021-11-03 MED ORDER — DIGOXIN 125 MCG PO TABS: 0.1250 mg | ORAL_TABLET | Freq: Every day | ORAL | 2 refills | Status: DC

## 2021-11-03 NOTE — Progress Notes (Signed)
° °Primary Physician/Referring:  Morrow, Aaron, MD ° °Patient ID: Keith Fisher, male    DOB: 03/11/1946, 75 y.o.   MRN: [id number] ° °Chief Complaint  °Patient presents with  ° Mitral Valve Prolapse  ° Follow-up  °  3 weeks  ° °HPI:   ° °Keith Fisher  is a 75 y.o. Patient Race: Asian [4] male with history of filaria since and has chronic lymphedema involving his right leg, mild chronic dyspnea, mitral valve prolapse with moderate to severe mitral regurgitation moderate to severe tricuspid vegetation by echocardiogram in 2020, fairly active and chronic bradycardia asymptomatic by EKG now referred to me for evaluation of new onset atrial fibrillation, last seen by me in 2018.  I saw him on 10/12/2021. ° °He was started on metoprolol tartrate 50 mg p.o. twice daily however he did not tolerate this, stated that he got short of breath.  Patient remains asymptomatic without any fatigue or dyspnea or chest pain. ° °Past Medical History:  °Diagnosis Date  ° Arthritis   ° Dyspnea   ° Lymphatic filariasis   ° Mitral valve regurgitation   ° Pulmonary hypertension (HCC)   ° Tricuspid regurgitation   ° °Past Surgical History:  °Procedure Laterality Date  ° INGUINAL HERNIA REPAIR Right 06/13/2021  ° Procedure: LAPAROSCOPIC RIGHT INGUINAL HERNIA REPAIR WITH MESH;  Surgeon: Ramirez, Armando, MD;  Location: [facility] OR;  Service: General;  Laterality: Right;  ° INSERTION OF MESH Right 06/13/2021  ° Procedure: INSERTION OF MESH;  Surgeon: Ramirez, Armando, MD;  Location: [facility] OR;  Service: General;  Laterality: Right;  ° POSTERIOR CERVICAL FUSION/FORAMINOTOMY N/A 05/14/2019  ° Procedure: Posterior Cervical Fusion and decompression with lateral mass fixation - Cervical Three-Four,Cervical Four-Five, Cervical Five-Six, Cervical Six-Seven.;  Surgeon: Cram, Gary, MD;  Location: [facility] OR;  Service: Neurosurgery;  Laterality: N/A;  posterior  ° SHOULDER SURGERY    ° TEE WITHOUT CARDIOVERSION N/A 03/06/2017  ° Procedure: TRANSESOPHAGEAL ECHOCARDIOGRAM (TEE);   Surgeon: Ganji, Jay, MD;  Location: [facility] ENDOSCOPY;  Service: Cardiovascular;  Laterality: N/A;  ° °History reviewed. No pertinent family history.  °Social History  ° °Tobacco Use  ° Smoking status: Never  ° Smokeless tobacco: Never  °Substance Use Topics  ° Alcohol use: No  ° °Marital Status: Married  °ROS  °Review of Systems  °Cardiovascular:  Negative for chest pain, dyspnea on exertion and leg swelling.  °Objective  °Blood pressure 117/77, pulse 71, temperature 98.3 °F (36.8 °C), temperature source Temporal, resp. rate 16, height 5' 10" (1.778 m), weight 191 lb 14.4 oz (87 kg). Body mass index is 27.53 kg/m².  °Vitals with BMI 11/03/2021 10/12/2021 06/13/2021  °Height 5' 10" 5' 10" -  °Weight 191 lbs 14 oz 189 lbs -  °BMI 27.53 27.12 -  °Systolic 117 132 153  °Diastolic 77 83 65  °Pulse 71 91 47  °  °Physical Exam °Neck:  °   Vascular: No carotid bruit or JVD.  °Cardiovascular:  °   Rate and Rhythm: Tachycardia present. Rhythm irregular.  °   Pulses: Normal pulses and intact distal pulses.  °   Heart sounds: No murmur heard. °  No gallop. No S3 or S4 sounds.  °Pulmonary:  °   Effort: Pulmonary effort is normal.  °   Breath sounds: Normal breath sounds.  °Abdominal:  °   General: Bowel sounds are normal.  °   Palpations: Abdomen is soft.  °Musculoskeletal:  °   Right lower leg: Edema (Lymphedema chronic) present.  °     Left lower leg: No edema.  Skin:    Capillary Refill: Capillary refill takes less than 2 seconds.     Medications and allergies  No Known Allergies   Medication list after today's encounter   Current Outpatient Medications:    acetaminophen (TYLENOL) 500 MG tablet, Take 500 mg by mouth every 6 (six) hours as needed for moderate pain., Disp: , Rfl:    digoxin (LANOXIN) 0.125 MG tablet, Take 1 tablet (0.125 mg total) by mouth daily., Disp: 30 tablet, Rfl: 2   gabapentin (NEURONTIN) 400 MG capsule, TAKE 1 CAPSULE BY MOUTH THREE TIMES DAILY, Disp: 240 capsule, Rfl: 1   XARELTO 20 MG TABS tablet,  Take 1 tablet (20 mg total) by mouth daily with supper., Disp: 30 tablet, Rfl: 3   metoprolol tartrate (LOPRESSOR) 50 MG tablet, Take 0.5 tablets (25 mg total) by mouth 2 (two) times daily., Disp: 60 tablet, Rfl: 2  Laboratory examination:   Recent Labs    06/13/21 0839  NA 136  K 4.0  CL 105  CO2 23  GLUCOSE 100*  BUN 10  CREATININE 1.14  CALCIUM 9.1  GFRNONAA >60   CrCl cannot be calculated (Patient's most recent lab result is older than the maximum 21 days allowed.).  CMP Latest Ref Rng & Units 06/13/2021 05/07/2019  Glucose 70 - 99 mg/dL 100(H) 137(H)  BUN 8 - 23 mg/dL 10 14  Creatinine 0.61 - 1.24 mg/dL 1.14 1.29(H)  Sodium 135 - 145 mmol/L 136 139  Potassium 3.5 - 5.1 mmol/L 4.0 4.5  Chloride 98 - 111 mmol/L 105 105  CO2 22 - 32 mmol/L 23 26  Calcium 8.9 - 10.3 mg/dL 9.1 9.1   CBC Latest Ref Rng & Units 06/13/2021 05/07/2019  WBC 4.0 - 10.5 K/uL 5.7 6.4  Hemoglobin 13.0 - 17.0 g/dL 13.6 13.2  Hematocrit 39.0 - 52.0 % 40.6 40.1  Platelets 150 - 400 K/uL 219 232   External labs:   Labs 09/27/2021:  A1c 6.3%.  TSH 1.40, normal.  Hb 13.2/HCT 31.3, platelets 240, normal indicis.  Serum glucose 99 mg, BUN 16, creatinine 1.14, potassium 4.9, LFTs normal, CMP otherwise normal.  Lipid profile from 2016: Total cholesterol 140, triglycerides 93, HDL 54, non-HDL cholesterol 87.  Radiology:    Cardiac Studies:   TEE   [03/06/2017]:  Normal LV systolic function. Mild prolapse of the anterior MV leaflet with posteriorly directed Moderate MR. Moderate TR with moderate pulmonary hypertension. Mild biatrial enlargement and mild RV dilatation. Normal thoracic aorta.  Nuclear stress test   [02/09/2017]:  1. The resting electrocardiogram demonstrated normal sinus rhythm, normal resting conduction, no resting arrhythmias and normal rest repolarization. Stress EKG is non-diagnostic for ischemia as it a pharmacologic stress using Lexiscan. Stress symptoms included dyspnea. 2.  Myocardial perfusion imaging is normal. Overall left ventricular systolic function was normal without regional wall motion abnormalities. The left ventricular ejection fraction was 75%.  PCV ECHOCARDIOGRAM COMPLETE 10/27/2021 1. Normal LV systolic function with EF 57%. Left ventricle cavity is normal in size. Normal global wall motion. Indeterminate diastolic filling pattern, elevated LAP. Calculated EF 57%. 2. Left atrial cavity is moderately dilated. Right atrial cavity is severely dilated. 3. Right ventricle cavity is severely dilated. Normal right ventricular function. Mild mitral valve leaflet thickening. Mild prolapse of the mitral valve leaflets. I cannot exclude flail leaflet. Severe (Grade IV) mitral regurgitation. 4. Structurally normal tricuspid valve.  Annular dilatation. Severe tricuspid regurgitation.  Mild pulmonary hypertension. RVSP measures 38 mmHg. 5. IVC  is dilated with poor inspiration collapse consistent with elevated right atrial pressure. °Compared to the study done on 04/29/2019, atrial fibrillation is new.  Previously left atrium mildly dilated.  Mild prolapse with moderate MR. °Previously RA moderately dilated.  TR was moderate to severe with moderate pulmonary hypertension. ° °   °EKG:  ° °EKG 10/12/2021: Atrial fibrillation with rapid ventricular sponsor at rate of 118 bpm, low voltage complexes, nonspecific T abnormality.   ° °Assessment  ° °  ICD-10-CM   °1. New onset atrial fibrillation (HCC)  I48.91 EKG 12-Lead  °  metoprolol tartrate (LOPRESSOR) 50 MG tablet  °  digoxin (LANOXIN) 0.125 MG tablet  °  CMP14+EGFR  °  CBC  °  °2. Severe tricuspid regurgitation  I07.1   °  °3. Severe mitral regurgitation  I34.0   °  °  ° °Medications Discontinued During This Encounter  °Medication Reason  ° Multiple Vitamin (MULTIVITAMIN) tablet   ° traMADol (ULTRAM) 50 MG tablet   ° metoprolol tartrate (LOPRESSOR) 50 MG tablet Reorder  °  °Meds ordered this encounter  °Medications  ° metoprolol  tartrate (LOPRESSOR) 50 MG tablet  °  Sig: Take 0.5 tablets (25 mg total) by mouth 2 (two) times daily.  °  Dispense:  60 tablet  °  Refill:  2  ° digoxin (LANOXIN) 0.125 MG tablet  °  Sig: Take 1 tablet (0.125 mg total) by mouth daily.  °  Dispense:  30 tablet  °  Refill:  2  ° °Orders Placed This Encounter  °Procedures  ° CMP14+EGFR  ° CBC  ° EKG 12-Lead  ° °Recommendations:  ° °Keith Fisher is a 75 y.o. Patient Race: Asian [4] male with history of filaria since and has chronic lymphedema involving his right leg, mild chronic dyspnea, mitral valve prolapse with moderate to severe mitral regurgitation moderate to severe tricuspid vegetation by echocardiogram in 2020, fairly active and chronic bradycardia asymptomatic by EKG now referred to me for evaluation of new onset atrial fibrillation, last seen by me in 2018.  I saw him on 10/12/2021. ° °Reviewed the echocardiogram, he now has severe TR and severe MR. Fortunately the right ventricular although severely dilated, function appears to be intact.  Left ventricular systolic function is also well-preserved.  He is not in any decompensated heart failure.  He persistent atrial fibrillation with RVR.  I suspect primary TR along with primary MR.  Do not suspect pulmonary hypertension to the etiology for RV dilatation and tricuspid regurgitation.  In spite of multivalve heart disease, he is very well compensated. ° °He could not tolerate metoprolol tartrate 50 mg twice daily, will reduce the dose to 25 mg p.o. twice daily for better heart rate control and add digoxin 0.125 mg daily.  He will need left and right heart catheterization and TEE to better evaluate cardiac structures and to exclude significant coronary disease prior to referral for valvular repair, suspect both tricuspid valve and mitral valve can be repaired. ° °For now she will continue with Xarelto.  I will see him back after the right and left heart catheterization.   ° °I called patient's son, explained to  him regarding mitral and tricuspid regurgitation, need for TEE, need for right and left heart catheterization.  He understands this and wants to proceed with the procedure, we will schedule and I will see him back after the procedure.  This was a 45-minute office visit encounter and time spent on coordination of care and discussion   with his son and trying to reach his son over the telephone.    Adrian Prows, MD, [facility] 11/03/2021, 1:56 PM Office: [phone]

## 2021-11-14 ENCOUNTER — Encounter (HOSPITAL_COMMUNITY): Payer: Self-pay | Admitting: Cardiology

## 2021-11-14 NOTE — Progress Notes (Signed)
Attempted to obtain medical history via telephone, unable to reach at this time. I left a voicemail to return pre surgical testing department's phone call.  

## 2021-11-18 LAB — CMP14+EGFR
ALT: 20 IU/L (ref 0–44)
AST: 29 IU/L (ref 0–40)
Albumin/Globulin Ratio: 2.2 (ref 1.2–2.2)
Albumin: 4.4 g/dL (ref 3.7–4.7)
Alkaline Phosphatase: 96 IU/L (ref 44–121)
BUN/Creatinine Ratio: 15 (ref 10–24)
BUN: 19 mg/dL (ref 8–27)
Bilirubin Total: 0.4 mg/dL (ref 0.0–1.2)
CO2: 22 mmol/L (ref 20–29)
Calcium: 9.4 mg/dL (ref 8.6–10.2)
Chloride: 104 mmol/L (ref 96–106)
Creatinine, Ser: 1.28 mg/dL — ABNORMAL HIGH (ref 0.76–1.27)
Globulin, Total: 2 g/dL (ref 1.5–4.5)
Glucose: 131 mg/dL — ABNORMAL HIGH (ref 70–99)
Potassium: 4.6 mmol/L (ref 3.5–5.2)
Sodium: 138 mmol/L (ref 134–144)
Total Protein: 6.4 g/dL (ref 6.0–8.5)
eGFR: 59 mL/min/{1.73_m2} — ABNORMAL LOW (ref >59)

## 2021-11-18 LAB — CBC
Hematocrit: 39.3 % (ref 37.5–51.0)
Hemoglobin: 13.1 g/dL (ref 13.0–17.7)
MCH: 29.8 pg (ref 26.6–33.0)
MCHC: 33.3 g/dL (ref 31.5–35.7)
MCV: 89 fL (ref 79–97)
Platelets: 244 10*3/uL (ref 150–450)
RBC: 4.4 x10E6/uL (ref 4.14–5.80)
RDW: 11.6 % (ref 11.6–15.4)
WBC: 6.9 10*3/uL (ref 3.4–10.8)

## 2021-11-22 ENCOUNTER — Ambulatory Visit (HOSPITAL_COMMUNITY): Payer: 59 | Admitting: Anesthesiology

## 2021-11-22 ENCOUNTER — Ambulatory Visit (HOSPITAL_COMMUNITY)
Admission: RE | Admit: 2021-11-22 | Discharge: 2021-11-22 | Disposition: A | Discharge status: Home / Self Care | Payer: 59 | Attending: Cardiology | Admitting: Cardiology

## 2021-11-22 ENCOUNTER — Encounter (HOSPITAL_COMMUNITY)
Admission: RE | Disposition: A | Discharge status: Home / Self Care | Payer: Self-pay | Source: Home / Self Care | Attending: Cardiology

## 2021-11-22 ENCOUNTER — Ambulatory Visit (HOSPITAL_COMMUNITY): Payer: 59

## 2021-11-22 ENCOUNTER — Other Ambulatory Visit: Payer: Self-pay

## 2021-11-22 ENCOUNTER — Ambulatory Visit (HOSPITAL_BASED_OUTPATIENT_CLINIC_OR_DEPARTMENT_OTHER): Payer: 59 | Admitting: Anesthesiology

## 2021-11-22 DIAGNOSIS — I3139 Other pericardial effusion (noninflammatory): Secondary | ICD-10-CM | POA: Insufficient documentation

## 2021-11-22 DIAGNOSIS — M1990 Unspecified osteoarthritis, unspecified site: Secondary | ICD-10-CM | POA: Diagnosis not present

## 2021-11-22 DIAGNOSIS — I340 Nonrheumatic mitral (valve) insufficiency: Secondary | ICD-10-CM | POA: Diagnosis present

## 2021-11-22 DIAGNOSIS — Z7901 Long term (current) use of anticoagulants: Secondary | ICD-10-CM | POA: Diagnosis not present

## 2021-11-22 DIAGNOSIS — I071 Rheumatic tricuspid insufficiency: Secondary | ICD-10-CM

## 2021-11-22 DIAGNOSIS — I272 Pulmonary hypertension, unspecified: Secondary | ICD-10-CM | POA: Diagnosis not present

## 2021-11-22 DIAGNOSIS — I4819 Other persistent atrial fibrillation: Secondary | ICD-10-CM | POA: Diagnosis present

## 2021-11-22 DIAGNOSIS — I081 Rheumatic disorders of both mitral and tricuspid valves: Secondary | ICD-10-CM | POA: Diagnosis not present

## 2021-11-22 DIAGNOSIS — I4891 Unspecified atrial fibrillation: Secondary | ICD-10-CM | POA: Diagnosis not present

## 2021-11-22 DIAGNOSIS — Z79899 Other long term (current) drug therapy: Secondary | ICD-10-CM | POA: Insufficient documentation

## 2021-11-22 DIAGNOSIS — I890 Lymphedema, not elsewhere classified: Secondary | ICD-10-CM | POA: Insufficient documentation

## 2021-11-22 DIAGNOSIS — I5033 Acute on chronic diastolic (congestive) heart failure: Secondary | ICD-10-CM | POA: Diagnosis not present

## 2021-11-22 HISTORY — PX: TEE WITHOUT CARDIOVERSION: SHX5443

## 2021-11-22 HISTORY — PX: BUBBLE STUDY: SHX6837

## 2021-11-22 HISTORY — PX: RIGHT/LEFT HEART CATH AND CORONARY ANGIOGRAPHY: CATH118266

## 2021-11-22 HISTORY — PX: CARDIOVERSION: SHX1299

## 2021-11-22 LAB — POCT I-STAT 7, (LYTES, BLD GAS, ICA,H+H)
Acid-base deficit: 1 mmol/L (ref 0.0–2.0)
Bicarbonate: 24 mmol/L (ref 20.0–28.0)
Calcium, Ion: 1.19 mmol/L (ref 1.15–1.40)
HCT: 32 % — ABNORMAL LOW (ref 39.0–52.0)
Hemoglobin: 10.9 g/dL — ABNORMAL LOW (ref 13.0–17.0)
O2 Saturation: 100 %
Potassium: 3.8 mmol/L (ref 3.5–5.1)
Sodium: 141 mmol/L (ref 135–145)
TCO2: 25 mmol/L (ref 22–32)
pCO2 arterial: 41.3 mmHg (ref 32–48)
pH, Arterial: 7.373 (ref 7.35–7.45)
pO2, Arterial: 198 mmHg — ABNORMAL HIGH (ref 83–108)

## 2021-11-22 LAB — POCT I-STAT EG7
Acid-base deficit: 1 mmol/L (ref 0.0–2.0)
Acid-base deficit: 1 mmol/L (ref 0.0–2.0)
Bicarbonate: 25.3 mmol/L (ref 20.0–28.0)
Bicarbonate: 25.5 mmol/L (ref 20.0–28.0)
Calcium, Ion: 1.29 mmol/L (ref 1.15–1.40)
Calcium, Ion: 1.29 mmol/L (ref 1.15–1.40)
HCT: 34 % — ABNORMAL LOW (ref 39.0–52.0)
HCT: 34 % — ABNORMAL LOW (ref 39.0–52.0)
Hemoglobin: 11.6 g/dL — ABNORMAL LOW (ref 13.0–17.0)
Hemoglobin: 11.6 g/dL — ABNORMAL LOW (ref 13.0–17.0)
O2 Saturation: 57 %
O2 Saturation: 58 %
Potassium: 4 mmol/L (ref 3.5–5.1)
Potassium: 4 mmol/L (ref 3.5–5.1)
Sodium: 139 mmol/L (ref 135–145)
Sodium: 140 mmol/L (ref 135–145)
TCO2: 27 mmol/L (ref 22–32)
TCO2: 27 mmol/L (ref 22–32)
pCO2, Ven: 48 mmHg (ref 44–60)
pCO2, Ven: 48.8 mmHg (ref 44–60)
pH, Ven: 7.322 (ref 7.25–7.43)
pH, Ven: 7.333 (ref 7.25–7.43)
pO2, Ven: 33 mmHg (ref 32–45)
pO2, Ven: 33 mmHg (ref 32–45)

## 2021-11-22 SURGERY — RIGHT/LEFT HEART CATH AND CORONARY ANGIOGRAPHY
Anesthesia: LOCAL

## 2021-11-22 SURGERY — ECHOCARDIOGRAM, TRANSESOPHAGEAL
Anesthesia: Monitor Anesthesia Care

## 2021-11-22 MED ORDER — ONDANSETRON HCL 4 MG/2ML IJ SOLN: 4.0000 mg | Freq: Four times a day (QID) | INTRAMUSCULAR | Status: DC | PRN

## 2021-11-22 MED ORDER — HEPARIN (PORCINE) IN NACL 2-0.9 UNITS/ML
INTRAMUSCULAR | Status: DC | PRN
  Administered 2021-11-22: 10 mL via INTRA_ARTERIAL

## 2021-11-22 MED ORDER — SODIUM CHLORIDE 0.9 % WEIGHT BASED INFUSION: 1.0000 mL/kg/h | INTRAVENOUS | Status: DC

## 2021-11-22 MED ORDER — AMIODARONE HCL 200 MG PO TABS: 200.0000 mg | ORAL_TABLET | Freq: Every day | ORAL | 3 refills | Status: DC

## 2021-11-22 MED ORDER — ASPIRIN 81 MG PO CHEW: 81.0000 mg | CHEWABLE_TABLET | ORAL | Status: DC

## 2021-11-22 MED ORDER — LIDOCAINE HCL (PF) 1 % IJ SOLN
INTRAMUSCULAR | Status: DC | PRN
  Administered 2021-11-22: 5 mL

## 2021-11-22 MED ORDER — SODIUM CHLORIDE 0.9% FLUSH: 3.0000 mL | Freq: Two times a day (BID) | INTRAVENOUS | Status: DC

## 2021-11-22 MED ORDER — FENTANYL CITRATE (PF) 100 MCG/2ML IJ SOLN
INTRAMUSCULAR | Status: DC | PRN
Start: 2021-11-22 — End: 2021-11-22
  Administered 2021-11-22: 25 ug via INTRAVENOUS

## 2021-11-22 MED ORDER — MIDAZOLAM HCL 2 MG/2ML IJ SOLN
INTRAMUSCULAR | Status: DC | PRN
  Administered 2021-11-22: 1 mg via INTRAVENOUS

## 2021-11-22 MED ORDER — SODIUM CHLORIDE 0.9% FLUSH: 3.0000 mL | INTRAVENOUS | Status: DC | PRN

## 2021-11-22 MED ORDER — SODIUM CHLORIDE 0.9 % IV SOLN: INTRAVENOUS | Status: DC

## 2021-11-22 MED ORDER — HEPARIN (PORCINE) IN NACL 1000-0.9 UT/500ML-% IV SOLN
INTRAVENOUS | Status: DC | PRN
  Administered 2021-11-22 (×2): 500 mL

## 2021-11-22 MED ORDER — HEPARIN SODIUM (PORCINE) 1000 UNIT/ML IJ SOLN
INTRAMUSCULAR | Status: AC
  Filled 2021-11-22: qty 10

## 2021-11-22 MED ORDER — PROPOFOL 10 MG/ML IV BOLUS
INTRAVENOUS | Status: DC | PRN
  Administered 2021-11-22: 40 mg via INTRAVENOUS
  Administered 2021-11-22: 20 mg via INTRAVENOUS

## 2021-11-22 MED ORDER — LIDOCAINE HCL (PF) 1 % IJ SOLN
INTRAMUSCULAR | Status: AC
Start: 2021-11-22 — End: ?
  Filled 2021-11-22: qty 30

## 2021-11-22 MED ORDER — VERAPAMIL HCL 2.5 MG/ML IV SOLN
INTRAVENOUS | Status: AC
  Filled 2021-11-22: qty 2

## 2021-11-22 MED ORDER — SODIUM CHLORIDE 0.9 % IV SOLN
INTRAVENOUS | Status: DC
Start: 2021-11-22 — End: 2021-11-22

## 2021-11-22 MED ORDER — FENTANYL CITRATE (PF) 100 MCG/2ML IJ SOLN
INTRAMUSCULAR | Status: AC
  Filled 2021-11-22: qty 2

## 2021-11-22 MED ORDER — EPHEDRINE SULFATE-NACL 50-0.9 MG/10ML-% IV SOSY
PREFILLED_SYRINGE | INTRAVENOUS | Status: DC | PRN
  Administered 2021-11-22: 10 mg via INTRAVENOUS

## 2021-11-22 MED ORDER — PROPOFOL 500 MG/50ML IV EMUL
INTRAVENOUS | Status: DC | PRN
  Administered 2021-11-22: 75 ug/kg/min via INTRAVENOUS

## 2021-11-22 MED ORDER — HEPARIN (PORCINE) IN NACL 1000-0.9 UT/500ML-% IV SOLN
INTRAVENOUS | Status: AC
  Filled 2021-11-22: qty 1000

## 2021-11-22 MED ORDER — AMIODARONE HCL 200 MG PO TABS
200.0000 mg | ORAL_TABLET | Freq: Once | ORAL | Status: AC
  Administered 2021-11-22: 200 mg via ORAL
  Filled 2021-11-22: qty 1

## 2021-11-22 MED ORDER — IOHEXOL 350 MG/ML SOLN
INTRAVENOUS | Status: DC | PRN
  Administered 2021-11-22: 20 mL via INTRA_ARTERIAL

## 2021-11-22 MED ORDER — HEPARIN SODIUM (PORCINE) 1000 UNIT/ML IJ SOLN
INTRAMUSCULAR | Status: DC | PRN
  Administered 2021-11-22: 3000 [IU] via INTRAVENOUS

## 2021-11-22 MED ORDER — ACETAMINOPHEN 325 MG PO TABS: 650.0000 mg | ORAL_TABLET | ORAL | Status: DC | PRN

## 2021-11-22 MED ORDER — MIDAZOLAM HCL 2 MG/2ML IJ SOLN
INTRAMUSCULAR | Status: AC
  Filled 2021-11-22: qty 2

## 2021-11-22 MED ORDER — SODIUM CHLORIDE 0.9 % IV SOLN: 250.0000 mL | INTRAVENOUS | Status: DC | PRN

## 2021-11-22 MED ORDER — PHENYLEPHRINE 40 MCG/ML (10ML) SYRINGE FOR IV PUSH (FOR BLOOD PRESSURE SUPPORT)
PREFILLED_SYRINGE | INTRAVENOUS | Status: DC | PRN
  Administered 2021-11-22 (×2): 120 ug via INTRAVENOUS
  Administered 2021-11-22: 160 ug via INTRAVENOUS
  Administered 2021-11-22: 120 ug via INTRAVENOUS

## 2021-11-22 MED ORDER — SODIUM CHLORIDE 0.9 % WEIGHT BASED INFUSION: 3.0000 mL/kg/h | INTRAVENOUS | Status: AC

## 2021-11-22 SURGICAL SUPPLY — 12 items
BAND ZEPHYR COMPRESS 30 LONG (HEMOSTASIS) ×1 IMPLANT
CATH BALLN WEDGE 5F 110CM (CATHETERS) ×1 IMPLANT
CATH OPTITORQUE TIG 4.0 5F (CATHETERS) ×1 IMPLANT
GLIDESHEATH SLEND A-KIT 6F 22G (SHEATH) ×1 IMPLANT
GUIDEWIRE .025 260CM (WIRE) ×1 IMPLANT
GUIDEWIRE INQWIRE 1.5J.035X260 (WIRE) IMPLANT
INQWIRE 1.5J .035X260CM (WIRE) ×2
KIT HEART LEFT (KITS) ×2 IMPLANT
PACK CARDIAC CATHETERIZATION (CUSTOM PROCEDURE TRAY) ×2 IMPLANT
SHEATH GLIDE SLENDER 4/5FR (SHEATH) ×1 IMPLANT
TRANSDUCER W/STOPCOCK (MISCELLANEOUS) ×2 IMPLANT
TUBING CIL FLEX 10 FLL-RA (TUBING) ×2 IMPLANT

## 2021-11-22 NOTE — Interval H&P Note (Signed)
History and Physical Interval Note: ? ?11/22/2021 ?11:15 AM ? ?Keith Fisher  has presented today for surgery, with the diagnosis of AFIB.  The various methods of treatment have been discussed with the patient and family. After consideration of risks, benefits and other options for treatment, the patient has consented to  Procedure(s): ?TRANSESOPHAGEAL ECHOCARDIOGRAM (TEE) (N/A) ?CARDIOVERSION (N/A) as a surgical intervention.  [name] history has been reviewed, patient examined, no change in status, stable for surgery.  I have reviewed the patient's chart and labs.  Questions were answered to the patient's satisfaction.   ? ? ?Yates Decamp ? ? ?

## 2021-11-22 NOTE — Anesthesia Preprocedure Evaluation (Signed)
Anesthesia Evaluation  ?Patient identified by MRN, date of birth, ID band ?Patient awake ? ? ? ?Reviewed: ?Allergy & Precautions, NPO status , Patient's Chart, lab work & pertinent test results ? ?History of Anesthesia Complications ?Negative for: history of anesthetic complications ? ?Airway ?Mallampati: II ? ?TM Distance: >3 FB ?Neck ROM: Full ? ? ? Dental ? ?(+) Missing,  ?  ?Pulmonary ?neg pulmonary ROS,  ?  ?Pulmonary exam normal ? ? ? ? ? ? ? Cardiovascular ?Normal cardiovascular exam+ dysrhythmias Atrial Fibrillation  ? ?TTE?04/2019:?EF 66%, mild LVH, mild LAE, moderate RAE, mildly dilated RV, mild prolapse of both MV leaflets, moderate MR, moderate to severe TR, moderate pulmonary hypertension- PASP 49 mm Hg with RA pressure estimated at ?8 mm Hg  ?  ?Neuro/Psych ?S/p C3-7 posterior fusion 05/14/19 ?negative psych ROS  ? GI/Hepatic ?negative GI ROS, Neg liver ROS,   ?Endo/Other  ?negative endocrine ROS ? Renal/GU ?negative Renal ROS  ?negative genitourinary ?  ?Musculoskeletal ? ?(+) Arthritis , Osteoarthritis,   ? Abdominal ?  ?Peds ? Hematology ?negative hematology ROS ?(+)   ?Anesthesia Other Findings ?Day of surgery medications reviewed with patient. ? Reproductive/Obstetrics ?negative OB ROS ? ?  ? ? ? ? ? ? ? ? ? ? ? ? ? ?  ?  ? ? ? ? ? ? ? ? ?Anesthesia Physical ? ?Anesthesia Plan ? ?ASA: 3 ? ?Anesthesia Plan: MAC  ? ?Post-op Pain Management:   ? ?Induction: Intravenous ? ?PONV Risk Score and Plan: 2 and Treatment may vary due to age or medical condition, Ondansetron and Dexamethasone ? ?Airway Management Planned: Nasal Cannula ? ?Additional Equipment:  ? ?Intra-op Plan:  ? ?Post-operative Plan:  ? ?Informed Consent: I have reviewed the patients History and Physical, chart, labs and discussed the procedure including the risks, benefits and alternatives for the proposed anesthesia with the patient or authorized representative who has indicated his/her understanding and  acceptance.  ? ? ? ?Dental advisory given ? ?Plan Discussed with: CRNA ? ?Anesthesia Plan Comments: (PAT note written 06/10/2021 by Myra Gianotti, PA-C. ?)  ? ? ? ? ? ? ?Anesthesia Quick Evaluation ? ?

## 2021-11-22 NOTE — Progress Notes (Signed)
?  Echocardiogram ?Echocardiogram Transesophageal has been performed. ? Keith Fisher ?11/22/2021, 12:46 PM ?

## 2021-11-22 NOTE — Interval H&P Note (Signed)
History and Physical Interval Note: ? ?11/22/2021 ?2:06 PM ? ?Keith Fisher  has presented today for surgery, with the diagnosis of shortness of breath.  The various methods of treatment have been discussed with the patient and family. After consideration of risks, benefits and other options for treatment, the patient has consented to  Procedure(s): ?RIGHT/LEFT HEART CATH AND CORONARY ANGIOGRAPHY (N/A) as a surgical intervention.  [name] history has been reviewed, patient examined, no change in status, stable for surgery.  I have reviewed the patient's chart and labs.  Questions were answered to the patient's satisfaction.   ? ? ?Yates Decamp ? ? ?

## 2021-11-22 NOTE — Transfer of Care (Signed)
Immediate Anesthesia Transfer of Care Note ? ?Patient: Keith Fisher ? ?Procedure(s) Performed: TRANSESOPHAGEAL ECHOCARDIOGRAM (TEE) ?CARDIOVERSION ?BUBBLE STUDY ? ?Patient Location: PACU and Endoscopy Unit ? ?Anesthesia Type:MAC ? ?Level of Consciousness: drowsy ? ?Airway & Oxygen Therapy: Patient Spontanous Breathing ? ?Post-op Assessment: Report given to RN and Post -op Vital signs reviewed and stable ? ?Post vital signs: Reviewed and stable ? ?Last Vitals:  ?Vitals Value Taken Time  ?BP 91/59 11/22/21 1243  ?Temp    ?Pulse 50 11/22/21 1244  ?Resp 20 11/22/21 1244  ?SpO2 99 % 11/22/21 1244  ?Vitals shown include unvalidated device data. ? ?Last Pain:  ?Vitals:  ? 11/22/21 1243  ?TempSrc: Temporal  ?PainSc: 0-No pain  ?   ? ?  ? ?Complications: No notable events documented. ?

## 2021-11-22 NOTE — CV Procedure (Addendum)
Procedure performed: TEE guided direct-current cardioversion ? ?TEE: Under MAC anesthesis, TEE was performed without complications: ?LV: Normal size. Low normal  EF. ?RV: Mildly dilated, normal RV function ?LA: Severely enlarged. Left atrial appendage: Normal without thrombus. Normal function. Inter atrial septum is intact without defect. Double contrast study negative for atrial level shunting. No late appearance of bubbles either. ?RA: Severely dilated ? ?MV: Mild MVP. Moderate central MR without reversal of flow in the pulmonary veins ?TV: Mild annular dilatation. Prolapse of the lateral leaflet with moderate to severe TR. Mild pulmonary hypertension (RVSP 35 mm Hg) ?AV: Normal. No AI or AS. ?PV: Normal. Trace PI. ? ?Pulmonary artery: Normal sized.  ?Thoracic and ascending aorta: Normal without significant plaque or atheromatous changes. ? ? ?Direct-current cardioversion: ?Patient was deeply sedated with propofol, following TEE, direct-current cardioversion was performed x2, 150 J x 2 with success to normal sinus rhythm with frequent PACs and brief episodes of atrial tachycardia.  Patient tolerated the procedure well. ? ? ?Adrian Prows, MD, [facility] ?11/22/2021, 12:38 PM ?Office: [phone] ?Fax: [phone] ?Pager: [phone]  ?

## 2021-11-23 ENCOUNTER — Encounter (HOSPITAL_COMMUNITY): Payer: Self-pay | Admitting: Cardiology

## 2021-11-23 NOTE — Anesthesia Postprocedure Evaluation (Signed)
Anesthesia Post Note ? ?Patient: Keith Fisher ? ?Procedure(s) Performed: TRANSESOPHAGEAL ECHOCARDIOGRAM (TEE) ?CARDIOVERSION ?BUBBLE STUDY ? ?  ? ?Patient location during evaluation: PACU ?Anesthesia Type: MAC ?Level of consciousness: awake and alert ?Pain management: pain level controlled ?Vital Signs Assessment: post-procedure vital signs reviewed and stable ?Respiratory status: spontaneous breathing, nonlabored ventilation and respiratory function stable ?Cardiovascular status: blood pressure returned to baseline and stable ?Postop Assessment: no apparent nausea or vomiting ?Anesthetic complications: no ? ? ?No notable events documented. ? ?Last Vitals:  ?Vitals:  ? 11/22/21 1630 11/22/21 1635  ?BP: 124/79   ?Pulse:  (!) 55  ?Resp:    ?Temp:    ?SpO2:  99%  ?  ?Last Pain:  ?Vitals:  ? 11/22/21 1500  ?TempSrc:   ?PainSc: 0-No pain  ? ?Pain Goal:   ? ?  ?  ?  ?  ?  ?  ?  ? ?Lynda Rainwater ? ? ? ? ?

## 2021-12-08 ENCOUNTER — Ambulatory Visit: Payer: 59 | Admitting: Cardiology

## 2021-12-08 ENCOUNTER — Encounter: Payer: Self-pay | Admitting: Cardiology

## 2021-12-08 VITALS — BP 143/89 | HR 92 | Temp 97.9°F | Resp 16 | Ht 70.0 in | Wt 185.6 lb

## 2021-12-08 DIAGNOSIS — I071 Rheumatic tricuspid insufficiency: Secondary | ICD-10-CM

## 2021-12-08 DIAGNOSIS — I340 Nonrheumatic mitral (valve) insufficiency: Secondary | ICD-10-CM

## 2021-12-08 DIAGNOSIS — I484 Atypical atrial flutter: Secondary | ICD-10-CM

## 2021-12-08 DIAGNOSIS — I4819 Other persistent atrial fibrillation: Secondary | ICD-10-CM

## 2021-12-08 NOTE — H&P (View-Only) (Signed)
? ?Primary Physician/Referring:  London Pepper, MD ? ?Patient ID: Keith Fisher, male    DOB: 1946/04/17, 76 y.o.   MRN: [id number] ? ?Chief Complaint  ?Patient presents with  ? Atrial Fibrillation  ? Valvular heart disease  ? Follow-up  ?  4 weeks  ? ?HPI:   ? ?Keith Fisher  is a 76 y.o. [place] male patient with history of filaria since and has chronic lymphedema involving his right leg, mild chronic dyspnea, mitral valve prolapse with moderate to severe mitral regurgitation moderate to severe tricuspid regurgitation by echocardiogram in 2020, fairly active and chronic bradycardia asymptomatic by EKG, referred to me for evaluation of new onset atrial fibrillation, I saw him on 10/12/2021.  He underwent right and left heart catheterization and TEE guided direct-current cardioversion on 11/22/2021 and presents for follow-up. ? ?Previously could not tolerate metoprolol due to marked fatigue and shortness of breath.  He is presently on amiodarone and tolerating this well.  States that his dyspnea has improved significantly and overall his energy level is also improved.  He feels well and has not had any complications from the procedure.  ? ?Past Medical History:  ?Diagnosis Date  ? Arthritis   ? Dyspnea   ? Lymphatic filariasis   ? Mitral valve regurgitation   ? Pulmonary hypertension ([place])   ? Tricuspid regurgitation   ? ?Past Surgical History:  ?Procedure Laterality Date  ? BUBBLE STUDY  11/22/2021  ? Procedure: BUBBLE STUDY;  Surgeon: Adrian Prows, MD;  Location: [place];  Service: Cardiovascular;;  ? CARDIOVERSION N/A 11/22/2021  ? Procedure: CARDIOVERSION;  Surgeon: Adrian Prows, MD;  Location: [place];  Service: Cardiovascular;  Laterality: N/A;  ? INGUINAL HERNIA REPAIR Right 06/13/2021  ? Procedure: LAPAROSCOPIC RIGHT INGUINAL HERNIA REPAIR WITH MESH;  Surgeon: Ralene Ok, MD;  Location: [place];  Service: General;  Laterality: Right;  ? INSERTION OF MESH Right 06/13/2021  ? Procedure: INSERTION OF MESH;   Surgeon: Ralene Ok, MD;  Location: [place];  Service: General;  Laterality: Right;  ? POSTERIOR CERVICAL FUSION/FORAMINOTOMY N/A 05/14/2019  ? Procedure: Posterior Cervical Fusion and decompression with lateral mass fixation - Cervical Three-Four,Cervical Four-Five, Cervical Five-Six, Cervical Six-Seven.;  Surgeon: Kary Kos, MD;  Location: [place];  Service: Neurosurgery;  Laterality: N/A;  posterior  ? RIGHT/LEFT HEART CATH AND CORONARY ANGIOGRAPHY N/A 11/22/2021  ? Procedure: RIGHT/LEFT HEART CATH AND CORONARY ANGIOGRAPHY;  Surgeon: Adrian Prows, MD;  Location: [place] [facility];  Service: Cardiovascular;  Laterality: N/A;  ? SHOULDER SURGERY    ? TEE WITHOUT CARDIOVERSION N/A 03/06/2017  ? Procedure: TRANSESOPHAGEAL ECHOCARDIOGRAM (TEE);  Surgeon: Adrian Prows, MD;  Location: [place];  Service: Cardiovascular;  Laterality: N/A;  ? TEE WITHOUT CARDIOVERSION N/A 11/22/2021  ? Procedure: TRANSESOPHAGEAL ECHOCARDIOGRAM (TEE);  Surgeon: Adrian Prows, MD;  Location: [place];  Service: Cardiovascular;  Laterality: N/A;  ? ?History reviewed. No pertinent family history.  ?Social History  ? ?Tobacco Use  ? Smoking status: Never  ? Smokeless tobacco: Never  ?Substance Use Topics  ? Alcohol use: No  ? ?Marital Status: Married  ?ROS  ?Review of Systems  ?Cardiovascular:  Positive for leg swelling (right leg). Negative for chest pain and dyspnea on exertion.  ?Objective  ?Blood pressure (!) 143/89, pulse 92, temperature 97.9 ?F (36.6 ?C), temperature source Temporal, resp. rate 16, height 5\' 10"  (1.778 m), weight 185 lb 9.6 oz (84.2 kg), SpO2 93 %. Body mass index is 26.63 kg/m?Marland Kitchen  ? ?  12/08/2021  ? 11:19 AM 11/22/2021  ?  4:35 PM 11/22/2021  ?  4:30 PM  ?Vitals with BMI  ?Height 5\' 10"     ?Weight 185 lbs 10 oz    ?BMI 26.63    ?Systolic [id number]  A999333  ?Diastolic 89  79  ?Pulse 92 55   ?  ?Physical Exam ?Neck:  ?   Vascular: No carotid bruit or JVD.  ?Cardiovascular:  ?   Rate and Rhythm: Normal rate. Rhythm irregular.  ?    Pulses: Normal pulses and intact distal pulses.  ?   Heart sounds: No murmur heard. ?  No gallop. No S3 or S4 sounds.  ?Pulmonary:  ?   Effort: Pulmonary effort is normal.  ?   Breath sounds: Normal breath sounds.  ?Abdominal:  ?   General: Bowel sounds are normal.  ?   Palpations: Abdomen is soft.  ?Musculoskeletal:  ?   Right lower leg: Edema (Lymphedema chronic) present.  ?   Left lower leg: No edema.  ?Skin: ?   Capillary Refill: Capillary refill takes less than 2 seconds.  ?  ?Medications and allergies  ? ?Allergies  ?Allergen Reactions  ? Digoxin And Related Anxiety  ?  A combination of metoprolol and digoxin caused anxiety  ? Metoprolol Anxiety  ?  A combination of metoprolol and digoxin caused anxiety  ?  ? ?Medication list after today's encounter  ? ?Current Outpatient Medications:  ?  acetaminophen (TYLENOL) 500 MG tablet, Take 500 mg by mouth every 6 (six) hours as needed for moderate pain., Disp: , Rfl:  ?  amiodarone (PACERONE) 200 MG tablet, Take 1 tablet (200 mg total) by mouth daily. 1 tablet 3 times a day x 1 week, 1 tablet 2 times a day x 1 week then one tab a day, Disp: 90 tablet, Rfl: 3 ?  diphenhydrAMINE (BENADRYL) 12.5 MG/5ML liquid, Take 12.5 mg by mouth 4 (four) times daily as needed., Disp: , Rfl:  ?  gabapentin (NEURONTIN) 400 MG capsule, TAKE 1 CAPSULE BY MOUTH THREE TIMES DAILY, Disp: 240 capsule, Rfl: 1 ?  Magnesium 250 MG TABS, Take 250 mg by mouth daily., Disp: , Rfl:  ?  Multiple Vitamin (MULTIVITAMIN WITH MINERALS) TABS tablet, Take 1 tablet by mouth daily., Disp: , Rfl:  ?  XARELTO 20 MG TABS tablet, Take 1 tablet (20 mg total) by mouth daily with supper., Disp: 30 tablet, Rfl: 3 ? ?Laboratory examination:  ? ?Recent Labs  ?  06/13/21 ?[id number] 11/17/21 ?1321 11/22/21 ?1429 11/22/21 ?1432 11/22/21 ?1433  ?NA 136 138 141 140 139  ?K 4.0 4.6 3.8 4.0 4.0  ?CL 105 104  --   --   --   ?CO2 23 22  --   --   --   ?GLUCOSE 100* 131*  --   --   --   ?BUN 10 19  --   --   --   ?CREATININE 1.14  1.28*  --   --   --   ?CALCIUM 9.1 9.4  --   --   --   ?GFRNONAA >60  --   --   --   --   ? ?estimated creatinine clearance is 53.1 mL/min (A) (by C-G formula based on SCr of 1.28 mg/dL (H)).  ? ?  Latest Ref Rng & Units 11/22/2021  ?  2:33 PM 11/22/2021  ?  2:32 PM 11/22/2021  ?  2:29 PM  ?CMP  ?Sodium 135 - 145 mmol/L 139  140   141    ?Potassium 3.5 - 5.1 mmol/L 4.0   4.0   3.8    ? ? ?  Latest Ref Rng & Units 11/22/2021  ?  2:33 PM 11/22/2021  ?  2:32 PM 11/22/2021  ?  2:29 PM  ?CBC  ?Hemoglobin 13.0 - 17.0 g/dL 11.6   11.6   10.9    ?Hematocrit 39.0 - 52.0 % 34.0   34.0   32.0    ? ?External labs:  ? ?Labs 09/27/2021: ? ?A1c 6.3%.  TSH 1.40, normal. ? ?Hb 13.2/HCT 31.3, platelets 240, normal indicis. ? ?Serum glucose 99 mg, BUN 16, creatinine 1.14, potassium 4.9, LFTs normal, CMP otherwise normal. ? ?Lipid profile from 2016: ?Total cholesterol 140, triglycerides 93, HDL 54, non-HDL cholesterol 87. ? ?Radiology:  ? ? ?Cardiac Studies:  ? ?TEE   [03/06/2017]:  ?Normal LV systolic function. Mild prolapse of the anterior MV leaflet with posteriorly directed Moderate MR. Moderate TR with moderate pulmonary hypertension. Mild biatrial enlargement and mild RV dilatation. Normal thoracic aorta. ? ?Nuclear stress test   [02/09/2017]:  ?1. The resting electrocardiogram demonstrated normal sinus rhythm, normal resting conduction, no resting arrhythmias and normal rest repolarization. Stress EKG is non-diagnostic for ischemia as it a pharmacologic stress using Lexiscan. Stress symptoms included dyspnea. ?2. Myocardial perfusion imaging is normal. Overall left ventricular systolic function was normal without regional wall motion abnormalities. The left ventricular ejection fraction was 75%. ? ?PCV ECHOCARDIOGRAM COMPLETE 10/27/2021 ?1. Normal LV systolic function with EF 57%. Left ventricle cavity is normal in size. Normal global wall motion. Indeterminate diastolic filling pattern, elevated LAP. Calculated EF 57%. ?2. Left  atrial cavity is moderately dilated. Right atrial cavity is severely dilated. ?3. Right ventricle cavity is severely dilated. Normal right ventricular function. ?Mild mitral valve leaflet thickening. Mild prolaps

## 2021-12-08 NOTE — Progress Notes (Signed)
? ?Primary Physician/Referring:  London Pepper, MD ? ?Patient ID: Keith Fisher, male    DOB: [birth year], 76 y.o.   MRN: [id number] ? ?Chief Complaint  ?Patient presents with  ? Atrial Fibrillation  ? Valvular heart disease  ? Follow-up  ?  4 weeks  ? ?HPI:   ? ?Keith Fisher  is a 76 y.o. [place] male patient with history of filaria since and has chronic lymphedema involving his right leg, mild chronic dyspnea, mitral valve prolapse with moderate to severe mitral regurgitation moderate to severe tricuspid regurgitation by echocardiogram in 2020, fairly active and chronic bradycardia asymptomatic by EKG, referred to me for evaluation of new onset atrial fibrillation, I saw him on 10/12/2021.  He underwent right and left heart catheterization and TEE guided direct-current cardioversion on 11/22/2021 and presents for follow-up. ? ?Previously could not tolerate metoprolol due to marked fatigue and shortness of breath.  He is presently on amiodarone and tolerating this well.  States that his dyspnea has improved significantly and overall his energy level is also improved.  He feels well and has not had any complications from the procedure.  ? ?Past Medical History:  ?Diagnosis Date  ? Arthritis   ? Dyspnea   ? Lymphatic filariasis   ? Mitral valve regurgitation   ? Pulmonary hypertension ([place])   ? Tricuspid regurgitation   ? ?Past Surgical History:  ?Procedure Laterality Date  ? BUBBLE STUDY  11/22/2021  ? Procedure: BUBBLE STUDY;  Surgeon: Adrian Prows, MD;  Location: [place];  Service: Cardiovascular;;  ? CARDIOVERSION N/A 11/22/2021  ? Procedure: CARDIOVERSION;  Surgeon: Adrian Prows, MD;  Location: [place];  Service: Cardiovascular;  Laterality: N/A;  ? INGUINAL HERNIA REPAIR Right 06/13/2021  ? Procedure: LAPAROSCOPIC RIGHT INGUINAL HERNIA REPAIR WITH MESH;  Surgeon: Ralene Ok, MD;  Location: [place];  Service: General;  Laterality: Right;  ? INSERTION OF MESH Right 06/13/2021  ? Procedure: INSERTION OF MESH;   Surgeon: Ralene Ok, MD;  Location: [place];  Service: General;  Laterality: Right;  ? POSTERIOR CERVICAL FUSION/FORAMINOTOMY N/A 05/14/2019  ? Procedure: Posterior Cervical Fusion and decompression with lateral mass fixation - Cervical Three-Four,Cervical Four-Five, Cervical Five-Six, Cervical Six-Seven.;  Surgeon: Kary Kos, MD;  Location: [place];  Service: Neurosurgery;  Laterality: N/A;  posterior  ? RIGHT/LEFT HEART CATH AND CORONARY ANGIOGRAPHY N/A 11/22/2021  ? Procedure: RIGHT/LEFT HEART CATH AND CORONARY ANGIOGRAPHY;  Surgeon: Adrian Prows, MD;  Location: [place] [facility];  Service: Cardiovascular;  Laterality: N/A;  ? SHOULDER SURGERY    ? TEE WITHOUT CARDIOVERSION N/A 03/06/2017  ? Procedure: TRANSESOPHAGEAL ECHOCARDIOGRAM (TEE);  Surgeon: Adrian Prows, MD;  Location: [place];  Service: Cardiovascular;  Laterality: N/A;  ? TEE WITHOUT CARDIOVERSION N/A 11/22/2021  ? Procedure: TRANSESOPHAGEAL ECHOCARDIOGRAM (TEE);  Surgeon: Adrian Prows, MD;  Location: [place];  Service: Cardiovascular;  Laterality: N/A;  ? ?History reviewed. No pertinent family history.  ?Social History  ? ?Tobacco Use  ? Smoking status: Never  ? Smokeless tobacco: Never  ?Substance Use Topics  ? Alcohol use: No  ? ?Marital Status: Married  ?ROS  ?Review of Systems  ?Cardiovascular:  Positive for leg swelling (right leg). Negative for chest pain and dyspnea on exertion.  ?Objective  ?Blood pressure (!) 143/89, pulse 92, temperature 97.9 ?F (36.6 ?C), temperature source Temporal, resp. rate 16, height 5\' 10"  (1.778 m), weight 185 lb 9.6 oz (84.2 kg), SpO2 93 %. Body mass index is 26.63 kg/m?Marland Kitchen  ? ?  12/08/2021  ? 11:19 AM 11/22/2021  ?  4:35 PM 11/22/2021  ?  4:30 PM  ?Vitals with BMI  ?Height 5' 10"    ?Weight 185 lbs 10 oz    ?BMI 26.63    ?Systolic 143  124  ?Diastolic 89  79  ?Pulse 92 55   ?  ?Physical Exam ?Neck:  ?   Vascular: No carotid bruit or JVD.  ?Cardiovascular:  ?   Rate and Rhythm: Normal rate. Rhythm irregular.  ?    Pulses: Normal pulses and intact distal pulses.  ?   Heart sounds: No murmur heard. ?  No gallop. No S3 or S4 sounds.  ?Pulmonary:  ?   Effort: Pulmonary effort is normal.  ?   Breath sounds: Normal breath sounds.  ?Abdominal:  ?   General: Bowel sounds are normal.  ?   Palpations: Abdomen is soft.  ?Musculoskeletal:  ?   Right lower leg: Edema (Lymphedema chronic) present.  ?   Left lower leg: No edema.  ?Skin: ?   Capillary Refill: Capillary refill takes less than 2 seconds.  ?  ?Medications and allergies  ? ?Allergies  ?Allergen Reactions  ? Digoxin And Related Anxiety  ?  A combination of metoprolol and digoxin caused anxiety  ? Metoprolol Anxiety  ?  A combination of metoprolol and digoxin caused anxiety  ?  ? ?Medication list after today's encounter  ? ?Current Outpatient Medications:  ?  acetaminophen (TYLENOL) 500 MG tablet, Take 500 mg by mouth every 6 (six) hours as needed for moderate pain., Disp: , Rfl:  ?  amiodarone (PACERONE) 200 MG tablet, Take 1 tablet (200 mg total) by mouth daily. 1 tablet 3 times a day x 1 week, 1 tablet 2 times a day x 1 week then one tab a day, Disp: 90 tablet, Rfl: 3 ?  diphenhydrAMINE (BENADRYL) 12.5 MG/5ML liquid, Take 12.5 mg by mouth 4 (four) times daily as needed., Disp: , Rfl:  ?  gabapentin (NEURONTIN) 400 MG capsule, TAKE 1 CAPSULE BY MOUTH THREE TIMES DAILY, Disp: 240 capsule, Rfl: 1 ?  Magnesium 250 MG TABS, Take 250 mg by mouth daily., Disp: , Rfl:  ?  Multiple Vitamin (MULTIVITAMIN WITH MINERALS) TABS tablet, Take 1 tablet by mouth daily., Disp: , Rfl:  ?  XARELTO 20 MG TABS tablet, Take 1 tablet (20 mg total) by mouth daily with supper., Disp: 30 tablet, Rfl: 3 ? ?Laboratory examination:  ? ?Recent Labs  ?  06/13/21 ?0839 11/17/21 ?1321 11/22/21 ?1429 11/22/21 ?1432 11/22/21 ?1433  ?NA 136 138 141 140 139  ?K 4.0 4.6 3.8 4.0 4.0  ?CL 105 104  --   --   --   ?CO2 23 22  --   --   --   ?GLUCOSE 100* 131*  --   --   --   ?BUN 10 19  --   --   --   ?CREATININE 1.14  1.28*  --   --   --   ?CALCIUM 9.1 9.4  --   --   --   ?GFRNONAA >60  --   --   --   --   ? ?estimated creatinine clearance is 53.1 mL/min (A) (by C-G formula based on SCr of 1.28 mg/dL (H)).  ? ?  Latest Ref Rng & Units 11/22/2021  ?  2:33 PM 11/22/2021  ?  2:32 PM 11/22/2021  ?  2:29 PM  ?CMP  ?Sodium 135 - 145 mmol/L 139     140   141    ?Potassium 3.5 - 5.1 mmol/L 4.0   4.0   3.8    ? ? ?  Latest Ref Rng & Units 11/22/2021  ?  2:33 PM 11/22/2021  ?  2:32 PM 11/22/2021  ?  2:29 PM  ?CBC  ?Hemoglobin 13.0 - 17.0 g/dL 11.6   11.6   10.9    ?Hematocrit 39.0 - 52.0 % 34.0   34.0   32.0    ? ?External labs:  ? ?Labs 09/27/2021: ? ?A1c 6.3%.  TSH 1.40, normal. ? ?Hb 13.2/HCT 31.3, platelets 240, normal indicis. ? ?Serum glucose 99 mg, BUN 16, creatinine 1.14, potassium 4.9, LFTs normal, CMP otherwise normal. ? ?Lipid profile from 2016: ?Total cholesterol 140, triglycerides 93, HDL 54, non-HDL cholesterol 87. ? ?Radiology:  ? ? ?Cardiac Studies:  ? ?TEE   [03/06/2017]:  ?Normal LV systolic function. Mild prolapse of the anterior MV leaflet with posteriorly directed Moderate MR. Moderate TR with moderate pulmonary hypertension. Mild biatrial enlargement and mild RV dilatation. Normal thoracic aorta. ? ?Nuclear stress test   [02/09/2017]:  ?1. The resting electrocardiogram demonstrated normal sinus rhythm, normal resting conduction, no resting arrhythmias and normal rest repolarization. Stress EKG is non-diagnostic for ischemia as it a pharmacologic stress using Lexiscan. Stress symptoms included dyspnea. ?2. Myocardial perfusion imaging is normal. Overall left ventricular systolic function was normal without regional wall motion abnormalities. The left ventricular ejection fraction was 75%. ? ?PCV ECHOCARDIOGRAM COMPLETE 10/27/2021 ?1. Normal LV systolic function with EF 57%. Left ventricle cavity is normal in size. Normal global wall motion. Indeterminate diastolic filling pattern, elevated LAP. Calculated EF 57%. ?2. Left  atrial cavity is moderately dilated. Right atrial cavity is severely dilated. ?3. Right ventricle cavity is severely dilated. Normal right ventricular function. ?Mild mitral valve leaflet thickening. Mild prolaps

## 2021-12-21 LAB — ECHO TEE
MV M vel: 4.64 m/s
MV Peak grad: 86.1 mmHg
Radius: 0.6 cm

## 2021-12-23 LAB — CBC
Hematocrit: 40.1 % (ref 37.5–51.0)
Hemoglobin: 13.7 g/dL (ref 13.0–17.7)
MCH: 30.2 pg (ref 26.6–33.0)
MCHC: 34.2 g/dL (ref 31.5–35.7)
MCV: 89 fL (ref 79–97)
Platelets: 228 10*3/uL (ref 150–450)
RBC: 4.53 x10E6/uL (ref 4.14–5.80)
RDW: 12.3 % (ref 11.6–15.4)
WBC: 6.7 10*3/uL (ref 3.4–10.8)

## 2021-12-23 LAB — BASIC METABOLIC PANEL
BUN/Creatinine Ratio: 12 (ref 10–24)
BUN: 16 mg/dL (ref 8–27)
CO2: 25 mmol/L (ref 20–29)
Calcium: 9.6 mg/dL (ref 8.6–10.2)
Chloride: 101 mmol/L (ref 96–106)
Creatinine, Ser: 1.39 mg/dL — ABNORMAL HIGH (ref 0.76–1.27)
Glucose: 102 mg/dL — ABNORMAL HIGH (ref 70–99)
Potassium: 4.6 mmol/L (ref 3.5–5.2)
Sodium: 135 mmol/L (ref 134–144)
eGFR: 54 mL/min/{1.73_m2} — ABNORMAL LOW (ref >59)

## 2021-12-23 LAB — MAGNESIUM: Magnesium: 2 mg/dL (ref 1.6–2.3)

## 2021-12-26 NOTE — Anesthesia Preprocedure Evaluation (Addendum)
Anesthesia Evaluation  ?Patient identified by MRN, date of birth, ID band ?Patient awake ? ? ? ?Reviewed: ?Allergy & Precautions, NPO status , Patient's Chart, lab work & pertinent test results ? ?Airway ?Mallampati: III ? ?TM Distance: >3 FB ?Neck ROM: Full ? ? ? Dental ? ?(+) Dental Advisory Given, Missing,  ?  ?Pulmonary ?neg pulmonary ROS,  ?  ?Pulmonary exam normal ?breath sounds clear to auscultation ? ? ? ? ? ? Cardiovascular ?pulmonary hypertensionNormal cardiovascular exam+ dysrhythmias Atrial Fibrillation + Valvular Problems/Murmurs MR  ?Rhythm:Irregular Rate:Normal ? ?TEE 2023 ??1. Left ventricular ejection fraction, by estimation, is 50 to 55%. The  ?left ventricle has low normal function. Left ventricular diastolic  ?function could not be evaluated.  ??2. Right ventricular systolic function is normal. The right ventricular  ?size is mildly enlarged. There is mildly elevated pulmonary artery  ?systolic pressure. The estimated right ventricular systolic pressure is  ?[id number] mmHg.  ??3. Left atrial size was severely dilated. No left atrial/left atrial  ?appendage thrombus was detected. The LAA emptying velocity was 30 cm/s.  ??4. Right atrial size was severely dilated.  ??5. A small pericardial effusion is present.  ??6. The mitral valve is normal in structure. Moderate mitral valve  ?regurgitation. There is mild prolapse of of the mitral valve.  ??7. The tricuspid valve is myxomatous. Tricuspid valve regurgitation is  ?moderate to severe.  ??8. The aortic valve is normal in structure. Aortic valve regurgitation is  ?not visualized. No aortic stenosis is present.  ?  ?Neuro/Psych ?negative neurological ROS ? negative psych ROS  ? GI/Hepatic ?negative GI ROS, Neg liver ROS,   ?Endo/Other  ?negative endocrine ROS ? Renal/GU ?negative Renal ROS  ?negative genitourinary ?  ?Musculoskeletal ? ?(+) Arthritis ,  ? Abdominal ?  ?Peds ? Hematology ?negative hematology ROS ?(+)    ?Anesthesia Other Findings ? ? Reproductive/Obstetrics ? ?  ? ? ? ? ? ? ? ? ? ? ? ? ? ?  ?  ? ? ? ? ? ? ? ?Anesthesia Physical ?Anesthesia Plan ? ?ASA: 3 ? ?Anesthesia Plan: General  ? ?Post-op Pain Management:   ? ?Induction: Intravenous ? ?PONV Risk Score and Plan: Propofol infusion and Treatment may vary due to age or medical condition ? ?Airway Management Planned: Natural Airway ? ?Additional Equipment:  ? ?Intra-op Plan:  ? ?Post-operative Plan:  ? ?Informed Consent: I have reviewed the patients History and Physical, chart, labs and discussed the procedure including the risks, benefits and alternatives for the proposed anesthesia with the patient or authorized representative who has indicated his/her understanding and acceptance.  ? ? ? ?Dental advisory given ? ?Plan Discussed with: CRNA ? ?Anesthesia Plan Comments:   ? ? ? ? ? ? ?Anesthesia Quick Evaluation ? ?

## 2021-12-27 ENCOUNTER — Other Ambulatory Visit: Payer: Self-pay

## 2021-12-27 ENCOUNTER — Ambulatory Visit (HOSPITAL_COMMUNITY)
Admission: RE | Admit: 2021-12-27 | Discharge: 2021-12-27 | Disposition: A | Discharge status: Home / Self Care | Payer: 59 | Attending: Cardiology | Admitting: Cardiology

## 2021-12-27 ENCOUNTER — Encounter (HOSPITAL_COMMUNITY)
Admission: RE | Disposition: A | Discharge status: Home / Self Care | Payer: Self-pay | Source: Home / Self Care | Attending: Cardiology

## 2021-12-27 ENCOUNTER — Ambulatory Visit (HOSPITAL_BASED_OUTPATIENT_CLINIC_OR_DEPARTMENT_OTHER): Payer: 59 | Admitting: Anesthesiology

## 2021-12-27 ENCOUNTER — Ambulatory Visit (HOSPITAL_COMMUNITY): Payer: 59 | Admitting: Anesthesiology

## 2021-12-27 DIAGNOSIS — I340 Nonrheumatic mitral (valve) insufficiency: Secondary | ICD-10-CM

## 2021-12-27 DIAGNOSIS — I484 Atypical atrial flutter: Secondary | ICD-10-CM | POA: Diagnosis not present

## 2021-12-27 DIAGNOSIS — I272 Pulmonary hypertension, unspecified: Secondary | ICD-10-CM

## 2021-12-27 DIAGNOSIS — I890 Lymphedema, not elsewhere classified: Secondary | ICD-10-CM | POA: Insufficient documentation

## 2021-12-27 DIAGNOSIS — I071 Rheumatic tricuspid insufficiency: Secondary | ICD-10-CM | POA: Diagnosis not present

## 2021-12-27 DIAGNOSIS — I4819 Other persistent atrial fibrillation: Secondary | ICD-10-CM | POA: Insufficient documentation

## 2021-12-27 DIAGNOSIS — I4891 Unspecified atrial fibrillation: Secondary | ICD-10-CM

## 2021-12-27 HISTORY — PX: CARDIOVERSION: SHX1299

## 2021-12-27 SURGERY — CARDIOVERSION
Anesthesia: General

## 2021-12-27 MED ORDER — SODIUM CHLORIDE 0.9 % IV SOLN
INTRAVENOUS | Status: AC | PRN
  Administered 2021-12-27: 500 mL via INTRAVENOUS

## 2021-12-27 MED ORDER — PHENYLEPHRINE HCL (PRESSORS) 10 MG/ML IV SOLN
INTRAVENOUS | Status: DC | PRN
  Administered 2021-12-27: 160 ug via INTRAVENOUS

## 2021-12-27 MED ORDER — LIDOCAINE 2% (20 MG/ML) 5 ML SYRINGE
INTRAMUSCULAR | Status: DC | PRN
  Administered 2021-12-27: 60 mg via INTRAVENOUS

## 2021-12-27 MED ORDER — PROPOFOL 10 MG/ML IV BOLUS
INTRAVENOUS | Status: DC | PRN
  Administered 2021-12-27: 50 mg via INTRAVENOUS

## 2021-12-27 MED ORDER — SODIUM CHLORIDE 0.9 % IV SOLN: INTRAVENOUS | Status: DC

## 2021-12-27 NOTE — Interval H&P Note (Signed)
History and Physical Interval Note: ? ?12/27/2021 ?12:03 PM ? ?Keith Fisher  has presented today for surgery, with the diagnosis of AFIB AND AFLUTTER.  The various methods of treatment have been discussed with the patient and family. After consideration of risks, benefits and other options for treatment, the patient has consented to  Procedure(s): ?CARDIOVERSION (N/A) as a surgical intervention.  [name] history has been reviewed, patient examined, no change in status, stable for surgery.  I have reviewed the patient's chart and labs.  Questions were answered to the patient's satisfaction.   ? ? ?Adrian Prows ? ? ?

## 2021-12-27 NOTE — CV Procedure (Signed)
Direct current cardioversion 12/27/2021 12:11 PM ? ?Indication symptomatic A. Fibrillation/atypical atrial flutter. ? ?Procedure: Using 50 mg of IV Propofol and 60 IV Lidocaine (for reducing venous pain) for achieving deep sedation, synchronized direct current cardioversion performed. Patient was delivered with 75 Joules of electricity X 1 with success to NSR and occasional junctional escape complexes. Patient tolerated the procedure well. No immediate complication noted.  ? ?Allergies as of 12/27/2021   ? ?   Reactions  ? Digoxin And Related Anxiety  ? A combination of metoprolol and digoxin caused anxiety  ? Metoprolol Anxiety  ? A combination of metoprolol and digoxin caused anxiety  ? ?  ? ?  ?Medication List  ?  ? ?TAKE these medications   ? ?acetaminophen 500 MG tablet ?Commonly known as: TYLENOL ?Take 500 mg by mouth every 6 (six) hours as needed for moderate pain. ?  ?amiodarone 200 MG tablet ?Commonly known as: PACERONE ?Take 0.5 tablets (100 mg total) by mouth daily. 1 tablet 3 times a day x 1 week, 1 tablet 2 times a day x 1 week then one tab a day ?What changed: how much to take ?  ?gabapentin 400 MG capsule ?Commonly known as: NEURONTIN ?TAKE 1 CAPSULE BY MOUTH THREE TIMES DAILY ?  ?Magnesium 250 MG Tabs ?Take 250 mg by mouth daily. ?  ?multivitamin with minerals Tabs tablet ?Take 1 tablet by mouth daily. ?  ?Xarelto 20 MG Tabs tablet ?Generic drug: rivaroxaban ?Take 1 tablet (20 mg total) by mouth daily with supper. ?  ? ?  ? ? ? ? ?Yates Decamp, MD, [facility] ?12/27/2021, 12:11 PM ?Office: [phone] ?Fax: [phone] ?Pager: [phone]  ? ?

## 2021-12-27 NOTE — Transfer of Care (Signed)
Immediate Anesthesia Transfer of Care Note ? ?Patient: Keith Fisher ? ?Procedure(s) Performed: CARDIOVERSION ? ?Patient Location: Endoscopy Unit ? ?Anesthesia Type:General ? ?Level of Consciousness: drowsy ? ?Airway & Oxygen Therapy: Patient Spontanous Breathing ? ?Post-op Assessment: Report given to RN and Post -op Vital signs reviewed and stable ? ?Post vital signs: Reviewed and stable ? ?Last Vitals:  ?Vitals Value Taken Time  ?BP 101/54   ?Temp    ?Pulse 45   ?Resp 17   ?SpO2 99   ? ? ?Last Pain:  ?Vitals:  ? 12/27/21 0937  ?TempSrc: Oral  ?PainSc: 0-No pain  ?   ? ?  ? ?Complications: No notable events documented. ?

## 2021-12-27 NOTE — Anesthesia Postprocedure Evaluation (Signed)
Anesthesia Post Note ? ?Patient: Keith Fisher ? ?Procedure(s) Performed: CARDIOVERSION ? ?  ? ?Patient location during evaluation: Endoscopy ?Anesthesia Type: General ?Level of consciousness: awake and alert ?Pain management: pain level controlled ?Vital Signs Assessment: post-procedure vital signs reviewed and stable ?Respiratory status: spontaneous breathing, nonlabored ventilation, respiratory function stable and patient connected to nasal cannula oxygen ?Cardiovascular status: blood pressure returned to baseline and stable ?Postop Assessment: no apparent nausea or vomiting ?Anesthetic complications: no ? ? ?No notable events documented. ? ?Last Vitals:  ?Vitals:  ? 12/27/21 1229 12/27/21 1234  ?BP: (!) 99/37 113/77  ?Pulse: (!) 46 (!) 50  ?Resp: (!) 25 14  ?Temp:    ?SpO2: 99% 99%  ?  ?Last Pain:  ?Vitals:  ? 12/27/21 1229  ?TempSrc:   ?PainSc: 0-No pain  ? ? ?  ?  ?  ?  ?  ?  ? ?[name] ? ? ? ? ?

## 2021-12-27 NOTE — Discharge Instructions (Signed)

## 2021-12-28 ENCOUNTER — Encounter (HOSPITAL_COMMUNITY): Payer: Self-pay | Admitting: Cardiology

## 2022-01-08 ENCOUNTER — Other Ambulatory Visit: Payer: Self-pay | Admitting: Cardiology

## 2022-01-08 DIAGNOSIS — I4891 Unspecified atrial fibrillation: Secondary | ICD-10-CM

## 2022-01-09 ENCOUNTER — Ambulatory Visit (INDEPENDENT_AMBULATORY_CARE_PROVIDER_SITE_OTHER): Payer: 59

## 2022-01-09 ENCOUNTER — Ambulatory Visit: Payer: Self-pay

## 2022-01-09 ENCOUNTER — Ambulatory Visit (INDEPENDENT_AMBULATORY_CARE_PROVIDER_SITE_OTHER): Payer: 59 | Admitting: Orthopedic Surgery

## 2022-01-09 DIAGNOSIS — M25561 Pain in right knee: Secondary | ICD-10-CM | POA: Diagnosis not present

## 2022-01-09 DIAGNOSIS — M25562 Pain in left knee: Secondary | ICD-10-CM | POA: Diagnosis not present

## 2022-01-09 DIAGNOSIS — M25552 Pain in left hip: Secondary | ICD-10-CM

## 2022-01-09 DIAGNOSIS — G8929 Other chronic pain: Secondary | ICD-10-CM | POA: Diagnosis not present

## 2022-01-09 DIAGNOSIS — M1712 Unilateral primary osteoarthritis, left knee: Secondary | ICD-10-CM | POA: Diagnosis not present

## 2022-01-12 ENCOUNTER — Encounter: Payer: Self-pay | Admitting: Cardiology

## 2022-01-12 ENCOUNTER — Ambulatory Visit: Payer: 59 | Admitting: Cardiology

## 2022-01-12 VITALS — BP 153/90 | HR 91 | Temp 98.0°F | Resp 16 | Ht 70.0 in | Wt 185.0 lb

## 2022-01-12 DIAGNOSIS — M1712 Unilateral primary osteoarthritis, left knee: Secondary | ICD-10-CM

## 2022-01-12 DIAGNOSIS — I071 Rheumatic tricuspid insufficiency: Secondary | ICD-10-CM

## 2022-01-12 DIAGNOSIS — I483 Typical atrial flutter: Secondary | ICD-10-CM

## 2022-01-12 DIAGNOSIS — I340 Nonrheumatic mitral (valve) insufficiency: Secondary | ICD-10-CM

## 2022-01-12 MED ORDER — AMIODARONE HCL 200 MG PO TABS: 200.0000 mg | ORAL_TABLET | Freq: Every day | ORAL | 3 refills | Status: DC

## 2022-01-12 MED ORDER — TRAMADOL HCL 50 MG PO TABS: 50.0000 mg | ORAL_TABLET | Freq: Four times a day (QID) | ORAL | 0 refills | Status: DC | PRN

## 2022-01-12 NOTE — Progress Notes (Signed)
? ?Primary Physician/Referring:  London Pepper, MD ? ?Patient ID: Keith Fisher, male    DOB: [birth year], 76 y.o.   MRN: [id number] ? ?Chief Complaint  ?Patient presents with  ? Atrial Fibrillation  ? Follow-up  ?  2 week  ? ?HPI:   ? ?Keith Fisher  is a 76 y.o. with history of filaria since and has chronic lymphedema involving his right leg, mild chronic dyspnea, mitral valve prolapse with moderate to severe mitral regurgitation moderate to severe tricuspid regurgitation by echocardiogram in 2020, fairly active and chronic bradycardia asymptomatic by EKG, referred to me for evaluation of new onset atrial fibrillation, I saw him on 10/12/2021.  He underwent right and left heart catheterization and TEE guided direct-current cardioversion on 11/22/2021  ? ?He had undergone repeat direct-current cardioversion for atrial flutter on 12/26/2021.  However he has discontinued taking amiodarone his dyspnea has remained stable on much improved since initial cardioversion on 11/22/2021.  No leg edema, except for his right leg lymphedema.  His main concern today is severe arthritis in his left knee.  He has been limping. ? ?Past Medical History:  ?Diagnosis Date  ? Arthritis   ? Dyspnea   ? Lymphatic filariasis   ? Mitral valve regurgitation   ? Pulmonary hypertension ([place])   ? Tricuspid regurgitation   ? ?Past Surgical History:  ?Procedure Laterality Date  ? BUBBLE STUDY  11/22/2021  ? Procedure: BUBBLE STUDY;  Surgeon: Adrian Prows, MD;  Location: [place];  Service: Cardiovascular;;  ? CARDIOVERSION N/A 11/22/2021  ? Procedure: CARDIOVERSION;  Surgeon: Adrian Prows, MD;  Location: [place];  Service: Cardiovascular;  Laterality: N/A;  ? CARDIOVERSION N/A 12/27/2021  ? Procedure: CARDIOVERSION;  Surgeon: Adrian Prows, MD;  Location: [place];  Service: Cardiovascular;  Laterality: N/A;  ? INGUINAL HERNIA REPAIR Right 06/13/2021  ? Procedure: LAPAROSCOPIC RIGHT INGUINAL HERNIA REPAIR WITH MESH;  Surgeon: Ralene Ok, MD;   Location: [place];  Service: General;  Laterality: Right;  ? INSERTION OF MESH Right 06/13/2021  ? Procedure: INSERTION OF MESH;  Surgeon: Ralene Ok, MD;  Location: [place];  Service: General;  Laterality: Right;  ? POSTERIOR CERVICAL FUSION/FORAMINOTOMY N/A 05/14/2019  ? Procedure: Posterior Cervical Fusion and decompression with lateral mass fixation - Cervical Three-Four,Cervical Four-Five, Cervical Five-Six, Cervical Six-Seven.;  Surgeon: Kary Kos, MD;  Location: [place];  Service: Neurosurgery;  Laterality: N/A;  posterior  ? RIGHT/LEFT HEART CATH AND CORONARY ANGIOGRAPHY N/A 11/22/2021  ? Procedure: RIGHT/LEFT HEART CATH AND CORONARY ANGIOGRAPHY;  Surgeon: Adrian Prows, MD;  Location: [place] CV LAB;  Service: Cardiovascular;  Laterality: N/A;  ? SHOULDER SURGERY    ? TEE WITHOUT CARDIOVERSION N/A 03/06/2017  ? Procedure: TRANSESOPHAGEAL ECHOCARDIOGRAM (TEE);  Surgeon: Adrian Prows, MD;  Location: [place];  Service: Cardiovascular;  Laterality: N/A;  ? TEE WITHOUT CARDIOVERSION N/A 11/22/2021  ? Procedure: TRANSESOPHAGEAL ECHOCARDIOGRAM (TEE);  Surgeon: Adrian Prows, MD;  Location: [place];  Service: Cardiovascular;  Laterality: N/A;  ? ?History reviewed. No pertinent family history.  ?Social History  ? ?Tobacco Use  ? Smoking status: Never  ? Smokeless tobacco: Never  ?Substance Use Topics  ? Alcohol use: No  ? ?Marital Status: Married  ?ROS  ?Review of Systems  ?Cardiovascular:  Positive for leg swelling (right leg). Negative for chest pain and dyspnea on exertion.  ?Musculoskeletal:  Positive for joint pain (left knee).  ?Objective  ?Blood pressure (!) 153/90, pulse 91, temperature 98 ?F (36.7 ?C), resp. rate 16, height 5'  10" (1.778 m), weight 185 lb (83.9 kg), SpO2 100 %. Body mass index is 26.54 kg/m?.  ? ?  01/12/2022  ?  1:21 PM 01/12/2022  ?  1:13 PM 12/27/2021  ? 12:34 PM  ?Vitals with BMI  ?Height  5\' 10"    ?Weight  185 lbs   ?BMI  26.54   ?Systolic 0000000 [id number]  ?Diastolic 90 99 77  ?Pulse 91 82 50   ?  ?Physical Exam ?Neck:  ?   Vascular: No carotid bruit or JVD.  ?Cardiovascular:  ?   Rate and Rhythm: Normal rate. Rhythm irregular.  ?   Pulses: Normal pulses and intact distal pulses.  ?   Heart sounds: No murmur heard. ?  No gallop. No S3 or S4 sounds.  ?Pulmonary:  ?   Effort: Pulmonary effort is normal.  ?   Breath sounds: Normal breath sounds.  ?Abdominal:  ?   General: Bowel sounds are normal.  ?   Palpations: Abdomen is soft.  ?Musculoskeletal:  ?   Right lower leg: Edema (Lymphedema chronic) present.  ?   Left lower leg: No edema.  ?Skin: ?   Capillary Refill: Capillary refill takes less than 2 seconds.  ?  ?Medications and allergies  ? ?Allergies  ?Allergen Reactions  ? Digoxin And Related Anxiety  ?  A combination of metoprolol and digoxin caused anxiety  ? Metoprolol Anxiety  ?  A combination of metoprolol and digoxin caused anxiety  ?  ? ?Medication list after today's encounter  ? ?Current Outpatient Medications:  ?  acetaminophen (TYLENOL) 500 MG tablet, Take 500 mg by mouth every 6 (six) hours as needed for moderate pain., Disp: , Rfl:  ?  gabapentin (NEURONTIN) 400 MG capsule, TAKE 1 CAPSULE BY MOUTH THREE TIMES DAILY, Disp: 240 capsule, Rfl: 1 ?  Magnesium 250 MG TABS, Take 250 mg by mouth daily., Disp: , Rfl:  ?  Multiple Vitamin (MULTIVITAMIN WITH MINERALS) TABS tablet, Take 1 tablet by mouth daily., Disp: , Rfl:  ?  traMADol (ULTRAM) 50 MG tablet, Take 1 tablet (50 mg total) by mouth every 6 (six) hours as needed., Disp: 60 tablet, Rfl: 0 ?  XARELTO 20 MG TABS tablet, Take 1 tablet (20 mg total) by mouth daily with supper., Disp: 30 tablet, Rfl: 3 ?  amiodarone (PACERONE) 200 MG tablet, Take 1 tablet (200 mg total) by mouth daily. 1 tablet 3 times a day x 1 week, 1 tablet 2 times a day x 1 week then one tab a day, Disp: 90 tablet, Rfl: 3 ? ?Laboratory examination:  ? ?Recent Labs  ?  06/13/21 ?[id number] 11/17/21 ?1321 11/22/21 ?1429 11/22/21 ?1432 11/22/21 ?1433 12/22/21 ?0830  ?NA 136 138   < >  140 139 135  ?K 4.0 4.6   < > 4.0 4.0 4.6  ?CL 105 104  --   --   --  101  ?CO2 23 22  --   --   --  25  ?GLUCOSE 100* 131*  --   --   --  102*  ?BUN 10 19  --   --   --  16  ?CREATININE 1.14 1.28*  --   --   --  1.39*  ?CALCIUM 9.1 9.4  --   --   --  9.6  ?GFRNONAA >60  --   --   --   --   --   ? < > = values in this interval not displayed.  ? ?  CrCl cannot be calculated (Patient's most recent lab result is older than the maximum 21 days allowed.).  ? ?  Latest Ref Rng & Units 12/22/2021  ?  8:30 AM 11/22/2021  ?  2:33 PM 11/22/2021  ?  2:32 PM  ?CMP  ?Glucose 70 - 99 mg/dL 102      ?BUN 8 - 27 mg/dL 16      ?Creatinine 0.76 - 1.27 mg/dL 1.39      ?Sodium 134 - 144 mmol/L 135   139   140    ?Potassium 3.5 - 5.2 mmol/L 4.6   4.0   4.0    ?Chloride 96 - 106 mmol/L 101      ?CO2 20 - 29 mmol/L 25      ?Calcium 8.6 - 10.2 mg/dL 9.6      ? ? ?  Latest Ref Rng & Units 12/22/2021  ?  8:30 AM 11/22/2021  ?  2:33 PM 11/22/2021  ?  2:32 PM  ?CBC  ?WBC 3.4 - 10.8 x10E3/uL 6.7      ?Hemoglobin 13.0 - 17.7 g/dL 13.7   11.6   11.6    ?Hematocrit 37.5 - 51.0 % 40.1   34.0   34.0    ?Platelets 150 - 450 x10E3/uL 228      ? ?External labs:  ? ?Labs 09/27/2021: ? ?A1c 6.3%.  TSH 1.40, normal. ? ?Hb 13.2/HCT 31.3, platelets 240, normal indicis. ? ?Serum glucose 99 mg, BUN 16, creatinine 1.14, potassium 4.9, LFTs normal, CMP otherwise normal. ? ?Lipid profile from 2016: ?Total cholesterol 140, triglycerides 93, HDL 54, non-HDL cholesterol 87. ? ?Radiology:  ? ? ?Cardiac Studies:  ? ?TEE   [03/06/2017]:  ?Normal LV systolic function. Mild prolapse of the anterior MV leaflet with posteriorly directed Moderate MR. Moderate TR with moderate pulmonary hypertension. Mild biatrial enlargement and mild RV dilatation. Normal thoracic aorta. ? ?Nuclear stress test   [02/09/2017]:  ?1. The resting electrocardiogram demonstrated normal sinus rhythm, normal resting conduction, no resting arrhythmias and normal rest repolarization. Stress EKG is  non-diagnostic for ischemia as it a pharmacologic stress using Lexiscan. Stress symptoms included dyspnea. ?2. Myocardial perfusion imaging is normal. Overall left ventricular systolic function was normal without regio

## 2022-01-15 ENCOUNTER — Encounter: Payer: Self-pay | Admitting: Orthopedic Surgery

## 2022-01-15 MED ORDER — BUPIVACAINE HCL 0.25 % IJ SOLN
4.0000 mL | INTRAMUSCULAR | Status: AC | PRN
  Administered 2022-01-09: 4 mL via INTRA_ARTICULAR

## 2022-01-15 MED ORDER — LIDOCAINE HCL 1 % IJ SOLN
5.0000 mL | INTRAMUSCULAR | Status: AC | PRN
  Administered 2022-01-09: 5 mL

## 2022-01-15 MED ORDER — METHYLPREDNISOLONE ACETATE 40 MG/ML IJ SUSP
40.0000 mg | INTRAMUSCULAR | Status: AC | PRN
  Administered 2022-01-09: 40 mg via INTRA_ARTICULAR

## 2022-01-15 NOTE — Progress Notes (Signed)
? ?Office Visit Note ?  ?Patient: Keith Fisher           ?Date of Birth: [birth year]           ?MRN: [id number] ?Visit Date: 01/09/2022 ?Requested by: Farris Has, MD ?[address] ?[address] ?[place],  [place] [address] ?PCP: Farris Has, MD ? ?Subjective: ?Chief Complaint  ?Patient presents with  ? Right Knee - Pain  ? Left Knee - Pain  ? ? ?HPI: Patient presents for evaluation of bilateral knee pain left worse than right today.  Has known history of fairly severe lateral compartment right knee arthritis.  Has filariasis affecting that right leg which essentially is giving him significant lymphedema below the knee.  Does not affect the left leg.  Today he is having left knee pain more than right knee pain.  Denies any history of injury.  Does use a cane.  Has had long and extensive history of back surgery as well. ?             ?ROS: All systems reviewed are negative as they relate to the chief complaint within the history of present illness.  Patient denies  fevers or chills. ? ? ?Assessment & Plan: ?Visit Diagnoses:  ?1. Chronic pain of both knees   ?2. Pain in left hip   ? ? ?Plan: Impression is bilateral knee pain left worse than right today.  Does have significant arthritis in the medial compartment of the left knee and lateral compartment in the right knee.  Left knee is injected today for pain relief.  Did get pretty reasonable pain relief in the clinic with the local anesthetic.  I think this argues for the fact that most of his pain is coming from the knee and not the hip.  Hip radiographs also unremarkable today.  I did do a literature search on the total knee replacements above limits with lymphedema.  As expected the infection rate revision rate and failure rate is significantly higher in those knees compared to 9 lymphedema knees and the matched cohort study.  If he does get infected then eradication of the infection could prove to be difficult with the limb in its current condition.  He will  follow-up as needed for further injections. ? ?Follow-Up Instructions: Return if symptoms worsen or fail to improve.  ? ?Orders:  ?Orders Placed This Encounter  ?Procedures  ? XR Knee 1-2 Views Right  ? XR KNEE 3 VIEW LEFT  ? XR HIP UNILAT W OR W/O PELVIS 2-3 VIEWS LEFT  ? ?No orders of the defined types were placed in this encounter. ? ? ? ? Procedures: ?Large Joint Inj: L knee on 01/09/2022 9:56 PM ?Indications: diagnostic evaluation, joint swelling and pain ?Details: 18 G 1.5 in needle, superolateral approach ? ?Arthrogram: No ? ?Medications: 5 mL lidocaine 1 %; 40 mg methylPREDNISolone acetate 40 MG/ML; 4 mL bupivacaine 0.25 % ?Outcome: tolerated well, no immediate complications ?Procedure, treatment alternatives, risks and benefits explained, specific risks discussed. Consent was given by the patient. Immediately prior to procedure a time out was called to verify the correct patient, procedure, equipment, support staff and site/side marked as required. Patient was prepped and draped in the usual sterile fashion.  ? ? ? ? ?Clinical Data: ?No additional findings. ? ?Objective: ?Vital Signs: There were no vitals taken for this visit. ? ?Physical Exam:  ? ?Constitutional: Patient appears well-developed ?HEENT:  ?Head: Normocephalic ?Eyes:EOM are normal ?Neck: Normal range of motion ?Cardiovascular: Normal rate ?  Pulmonary/chest: Effort normal ?Neurologic: Patient is alert ?Skin: Skin is warm ?Psychiatric: Patient has normal mood and affect ? ? ?Ortho Exam: Ortho exam demonstrates palpable pedal pulses with edema in that right lower extremity which is unchanged.  No significant effusion in the right knee.  Left knee has trace effusion.  Extensor mechanism intact bilaterally.  Left knee has pretty reasonable range of motion from slightly less than 5 degrees to 110.  Collateral and cruciate ligaments are stable.  Not too much in the way of groin pain with internal and external rotation of either leg. ? ?Specialty  Comments:  ?No specialty comments available. ? ?Imaging: ?No results found. ? ? ?PMFS History: ?Patient Active Problem List  ? Diagnosis Date Noted  ? Tricuspid regurgitation 11/22/2021  ? Persistent atrial fibrillation ([facility]) 11/22/2021  ? Myelopathy ([facility]) 05/14/2019  ? Dyspnea on exertion 03/05/2017  ? Mitral regurgitation 03/05/2017  ? ?Past Medical History:  ?Diagnosis Date  ? Arthritis   ? Dyspnea   ? Lymphatic filariasis   ? Mitral valve regurgitation   ? Pulmonary hypertension ([facility])   ? Tricuspid regurgitation   ?  ?History reviewed. No pertinent family history.  ?Past Surgical History:  ?Procedure Laterality Date  ? BUBBLE STUDY  11/22/2021  ? Procedure: BUBBLE STUDY;  Surgeon: Yates Decamp, MD;  Location: [facility] ENDOSCOPY;  Service: Cardiovascular;;  ? CARDIOVERSION N/A 11/22/2021  ? Procedure: CARDIOVERSION;  Surgeon: Yates Decamp, MD;  Location: [facility] ENDOSCOPY;  Service: Cardiovascular;  Laterality: N/A;  ? CARDIOVERSION N/A 12/27/2021  ? Procedure: CARDIOVERSION;  Surgeon: Yates Decamp, MD;  Location: [facility] ENDOSCOPY;  Service: Cardiovascular;  Laterality: N/A;  ? INGUINAL HERNIA REPAIR Right 06/13/2021  ? Procedure: LAPAROSCOPIC RIGHT INGUINAL HERNIA REPAIR WITH MESH;  Surgeon: Axel Filler, MD;  Location: [facility] OR;  Service: General;  Laterality: Right;  ? INSERTION OF MESH Right 06/13/2021  ? Procedure: INSERTION OF MESH;  Surgeon: Axel Filler, MD;  Location: [facility] OR;  Service: General;  Laterality: Right;  ? POSTERIOR CERVICAL FUSION/FORAMINOTOMY N/A 05/14/2019  ? Procedure: Posterior Cervical Fusion and decompression with lateral mass fixation - Cervical Three-Four,Cervical Four-Five, Cervical Five-Six, Cervical Six-Seven.;  Surgeon: Donalee Citrin, MD;  Location: [facility] OR;  Service: Neurosurgery;  Laterality: N/A;  posterior  ? RIGHT/LEFT HEART CATH AND CORONARY ANGIOGRAPHY N/A 11/22/2021  ? Procedure: RIGHT/LEFT HEART CATH AND CORONARY ANGIOGRAPHY;  Surgeon: Yates Decamp, MD;  Location: [facility] INVASIVE CV LAB;  Service:  Cardiovascular;  Laterality: N/A;  ? SHOULDER SURGERY    ? TEE WITHOUT CARDIOVERSION N/A 03/06/2017  ? Procedure: TRANSESOPHAGEAL ECHOCARDIOGRAM (TEE);  Surgeon: Yates Decamp, MD;  Location: [facility] ENDOSCOPY;  Service: Cardiovascular;  Laterality: N/A;  ? TEE WITHOUT CARDIOVERSION N/A 11/22/2021  ? Procedure: TRANSESOPHAGEAL ECHOCARDIOGRAM (TEE);  Surgeon: Yates Decamp, MD;  Location: [facility] ENDOSCOPY;  Service: Cardiovascular;  Laterality: N/A;  ? ?Social History  ? ?Occupational History  ? Not on file  ?Tobacco Use  ? Smoking status: Never  ? Smokeless tobacco: Never  ?Vaping Use  ? Vaping Use: Never used  ?Substance and Sexual Activity  ? Alcohol use: No  ? Drug use: No  ? Sexual activity: Not on file  ? ? ? ? ? ?

## 2022-01-18 ENCOUNTER — Ambulatory Visit (INDEPENDENT_AMBULATORY_CARE_PROVIDER_SITE_OTHER): Payer: 59

## 2022-01-18 ENCOUNTER — Ambulatory Visit (INDEPENDENT_AMBULATORY_CARE_PROVIDER_SITE_OTHER): Payer: 59 | Admitting: Orthopedic Surgery

## 2022-01-18 ENCOUNTER — Encounter (HOSPITAL_COMMUNITY): Payer: Self-pay | Admitting: Cardiology

## 2022-01-18 DIAGNOSIS — M79605 Pain in left leg: Secondary | ICD-10-CM

## 2022-01-18 DIAGNOSIS — S32502A Unspecified fracture of left pubis, initial encounter for closed fracture: Secondary | ICD-10-CM | POA: Diagnosis not present

## 2022-01-18 NOTE — Progress Notes (Signed)
Attempted to obtain medical history via telephone, unable to reach at this time. I left a voicemail on primary listed number (son) to return pre surgical testing department's phone call.  ?

## 2022-01-19 ENCOUNTER — Encounter: Payer: Self-pay | Admitting: Orthopedic Surgery

## 2022-01-19 ENCOUNTER — Other Ambulatory Visit: Payer: Self-pay | Admitting: Orthopedic Surgery

## 2022-01-19 MED ORDER — ACETAMINOPHEN-CODEINE #3 300-30 MG PO TABS: 1.0000 | ORAL_TABLET | Freq: Three times a day (TID) | ORAL | 0 refills | Status: DC | PRN

## 2022-01-19 MED ORDER — ACETAMINOPHEN-CODEINE #3 300-30 MG PO TABS: 1.0000 | ORAL_TABLET | Freq: Three times a day (TID) | ORAL | 0 refills | Status: AC | PRN

## 2022-01-19 NOTE — Progress Notes (Signed)
? ?Office Visit Note ?  ?Patient: Keith Fisher           ?Date of Birth: 21-Nov-1945           ?MRN: [id number] ?Visit Date: 01/18/2022 ?Requested by: Farris Has, MD ?[address] ?[address] ?[place],  [place] [address] ?PCP: Farris Has, MD ? ?Subjective: ?Chief Complaint  ?Patient presents with  ? Left Hip - Pain  ? Lower Back - Pain  ? ? ?HPI: Keith Fisher is a 76 year old patient with left hip and leg pain.  Had a fall 01/16/2022.  Hard for him to sit on the left-hand side.  Reports buttock pain but no groin pain.  He is using a walking stick.  Taking Tylenol for pain.  Has known history of filariasis affecting the right lower extremity.  I did a recent literature search on total knee replacement in patients with lymphedema and predictably the rate of infection, revision, and complications was significantly higher in those patients versus matched control group. ?             ?ROS: All systems reviewed are negative as they relate to the chief complaint within the history of present illness.  Patient denies  fevers or chills. ? ? ?Assessment & Plan: ?Visit Diagnoses:  ?1. Left leg pain   ? ? ?Plan: Impression is left hip pubic rami fracture with no proximal femur fracture.  Plan is Tylenol 3 and weightbearing as tolerated.  No Hohn should be feeling better within approximately 3 weeks.  4-week return for clinical recheck on fracture healing.  He will need radiographs at that time as well.  Tylenol 3 prescribed. ? ?Follow-Up Instructions: No follow-ups on file.  ? ?Orders:  ?Orders Placed This Encounter  ?Procedures  ? XR HIP UNILAT W OR W/O PELVIS 2-3 VIEWS LEFT  ? XR Lumbar Spine 2-3 Views  ? ?No orders of the defined types were placed in this encounter. ? ? ? ? Procedures: ?No procedures performed ? ? ?Clinical Data: ?No additional findings. ? ?Objective: ?Vital Signs: There were no vitals taken for this visit. ? ?Physical Exam:  ? ?Constitutional: Patient appears well-developed ?HEENT:  ?Head:  Normocephalic ?Eyes:EOM are normal ?Neck: Normal range of motion ?Cardiovascular: Normal rate ?Pulmonary/chest: Effort normal ?Neurologic: Patient is alert ?Skin: Skin is warm ?Psychiatric: Patient has normal mood and affect ? ? ?Ortho Exam: Ortho exam demonstrates antalgic gait to the left.  He has no groin pain or trochanteric pain to direct palpation or with hip range of motion.  Does have pain with resisted hip flexion and adduction on the left-hand side. ? ?Specialty Comments:  ?No specialty comments available. ? ?Imaging: ?XR HIP UNILAT W OR W/O PELVIS 2-3 VIEWS LEFT ? ?Result Date: 01/19/2022 ?AP pelvis lateral left hip reviewed.  Nondisplaced pubic rami fracture is visible.  No proximal femur fracture present. ? ?XR Lumbar Spine 2-3 Views ? ?Result Date: 01/19/2022 ?AP lateral radiographs lumbar spine reviewed.  Degenerative changes present between the vertebral bodies as well as in the facet joints.  No acute fracture or spondylolisthesis.  ? ? ?PMFS History: ?Patient Active Problem List  ? Diagnosis Date Noted  ? Tricuspid regurgitation 11/22/2021  ? Persistent atrial fibrillation (HCC) 11/22/2021  ? Myelopathy (HCC) 05/14/2019  ? Dyspnea on exertion 03/05/2017  ? Mitral regurgitation 03/05/2017  ? ?Past Medical History:  ?Diagnosis Date  ? Arthritis   ? Dyspnea   ? Lymphatic filariasis   ? Mitral valve regurgitation   ? Pulmonary hypertension (  HCC)   ? Tricuspid regurgitation   ?  ?History reviewed. No pertinent family history.  ?Past Surgical History:  ?Procedure Laterality Date  ? BUBBLE STUDY  11/22/2021  ? Procedure: BUBBLE STUDY;  Surgeon: Yates Decamp, MD;  Location: [facility] ENDOSCOPY;  Service: Cardiovascular;;  ? CARDIOVERSION N/A 11/22/2021  ? Procedure: CARDIOVERSION;  Surgeon: Yates Decamp, MD;  Location: [facility] ENDOSCOPY;  Service: Cardiovascular;  Laterality: N/A;  ? CARDIOVERSION N/A 12/27/2021  ? Procedure: CARDIOVERSION;  Surgeon: Yates Decamp, MD;  Location: [facility] ENDOSCOPY;  Service: Cardiovascular;   Laterality: N/A;  ? INGUINAL HERNIA REPAIR Right 06/13/2021  ? Procedure: LAPAROSCOPIC RIGHT INGUINAL HERNIA REPAIR WITH MESH;  Surgeon: Axel Filler, MD;  Location: [facility] OR;  Service: General;  Laterality: Right;  ? INSERTION OF MESH Right 06/13/2021  ? Procedure: INSERTION OF MESH;  Surgeon: Axel Filler, MD;  Location: [facility] OR;  Service: General;  Laterality: Right;  ? POSTERIOR CERVICAL FUSION/FORAMINOTOMY N/A 05/14/2019  ? Procedure: Posterior Cervical Fusion and decompression with lateral mass fixation - Cervical Three-Four,Cervical Four-Five, Cervical Five-Six, Cervical Six-Seven.;  Surgeon: Donalee Citrin, MD;  Location: [facility] OR;  Service: Neurosurgery;  Laterality: N/A;  posterior  ? RIGHT/LEFT HEART CATH AND CORONARY ANGIOGRAPHY N/A 11/22/2021  ? Procedure: RIGHT/LEFT HEART CATH AND CORONARY ANGIOGRAPHY;  Surgeon: Yates Decamp, MD;  Location: [facility] INVASIVE CV LAB;  Service: Cardiovascular;  Laterality: N/A;  ? SHOULDER SURGERY    ? TEE WITHOUT CARDIOVERSION N/A 03/06/2017  ? Procedure: TRANSESOPHAGEAL ECHOCARDIOGRAM (TEE);  Surgeon: Yates Decamp, MD;  Location: [facility] ENDOSCOPY;  Service: Cardiovascular;  Laterality: N/A;  ? TEE WITHOUT CARDIOVERSION N/A 11/22/2021  ? Procedure: TRANSESOPHAGEAL ECHOCARDIOGRAM (TEE);  Surgeon: Yates Decamp, MD;  Location: [facility] ENDOSCOPY;  Service: Cardiovascular;  Laterality: N/A;  ? ?Social History  ? ?Occupational History  ? Not on file  ?Tobacco Use  ? Smoking status: Never  ? Smokeless tobacco: Never  ?Vaping Use  ? Vaping Use: Never used  ?Substance and Sexual Activity  ? Alcohol use: No  ? Drug use: No  ? Sexual activity: Not on file  ? ? ? ? ? ?

## 2022-01-25 ENCOUNTER — Other Ambulatory Visit: Payer: Self-pay

## 2022-01-25 ENCOUNTER — Encounter (HOSPITAL_COMMUNITY): Payer: Self-pay | Admitting: Cardiology

## 2022-01-25 ENCOUNTER — Ambulatory Visit (HOSPITAL_BASED_OUTPATIENT_CLINIC_OR_DEPARTMENT_OTHER): Payer: 59 | Admitting: Anesthesiology

## 2022-01-25 ENCOUNTER — Ambulatory Visit (HOSPITAL_COMMUNITY): Payer: 59 | Admitting: Anesthesiology

## 2022-01-25 ENCOUNTER — Ambulatory Visit (HOSPITAL_COMMUNITY)
Admission: RE | Admit: 2022-01-25 | Discharge: 2022-01-25 | Disposition: A | Discharge status: Home / Self Care | Payer: 59 | Attending: Cardiology | Admitting: Cardiology

## 2022-01-25 ENCOUNTER — Encounter (HOSPITAL_COMMUNITY)
Admission: RE | Disposition: A | Discharge status: Home / Self Care | Payer: Self-pay | Source: Home / Self Care | Attending: Cardiology

## 2022-01-25 DIAGNOSIS — I4892 Unspecified atrial flutter: Secondary | ICD-10-CM | POA: Insufficient documentation

## 2022-01-25 DIAGNOSIS — I081 Rheumatic disorders of both mitral and tricuspid valves: Secondary | ICD-10-CM | POA: Insufficient documentation

## 2022-01-25 DIAGNOSIS — M1712 Unilateral primary osteoarthritis, left knee: Secondary | ICD-10-CM | POA: Diagnosis not present

## 2022-01-25 DIAGNOSIS — M1990 Unspecified osteoarthritis, unspecified site: Secondary | ICD-10-CM

## 2022-01-25 DIAGNOSIS — Z7901 Long term (current) use of anticoagulants: Secondary | ICD-10-CM | POA: Diagnosis not present

## 2022-01-25 DIAGNOSIS — I483 Typical atrial flutter: Secondary | ICD-10-CM

## 2022-01-25 HISTORY — PX: CARDIOVERSION: SHX1299

## 2022-01-25 LAB — POCT I-STAT, CHEM 8
BUN: 18 mg/dL (ref 8–23)
Calcium, Ion: 1.18 mmol/L (ref 1.15–1.40)
Chloride: 102 mmol/L (ref 98–111)
Creatinine, Ser: 1.5 mg/dL — ABNORMAL HIGH (ref 0.61–1.24)
Glucose, Bld: 115 mg/dL — ABNORMAL HIGH (ref 70–99)
HCT: 36 % — ABNORMAL LOW (ref 39.0–52.0)
Hemoglobin: 12.2 g/dL — ABNORMAL LOW (ref 13.0–17.0)
Potassium: 4.3 mmol/L (ref 3.5–5.1)
Sodium: 138 mmol/L (ref 135–145)
TCO2: 26 mmol/L (ref 22–32)

## 2022-01-25 SURGERY — CARDIOVERSION
Anesthesia: General

## 2022-01-25 MED ORDER — AMIODARONE HCL 200 MG PO TABS: 100.0000 mg | ORAL_TABLET | ORAL | 3 refills | Status: DC

## 2022-01-25 MED ORDER — SODIUM CHLORIDE 0.9 % IV SOLN
INTRAVENOUS | Status: DC | PRN
Start: 2022-01-25 — End: 2022-01-25

## 2022-01-25 MED ORDER — PROPOFOL 10 MG/ML IV BOLUS
INTRAVENOUS | Status: DC | PRN
  Administered 2022-01-25: 60 mg via INTRAVENOUS

## 2022-01-25 MED ORDER — EPHEDRINE SULFATE (PRESSORS) 50 MG/ML IJ SOLN
INTRAMUSCULAR | Status: DC | PRN
  Administered 2022-01-25 (×2): 5 mg via INTRAVENOUS

## 2022-01-25 MED ORDER — LIDOCAINE 2% (20 MG/ML) 5 ML SYRINGE
INTRAMUSCULAR | Status: DC | PRN
  Administered 2022-01-25: 40 mg via INTRAVENOUS

## 2022-01-25 NOTE — Transfer of Care (Signed)
Immediate Anesthesia Transfer of Care Note ? ?Patient: Keith Fisher ? ?Procedure(s) Performed: CARDIOVERSION ? ?Patient Location: Endoscopy Unit ? ?Anesthesia Type:General ? ?Level of Consciousness: drowsy, patient cooperative and responds to stimulation ? ?Airway & Oxygen Therapy: Patient Spontanous Breathing ? ?Post-op Assessment: Report given to RN and Post -op Vital signs reviewed and stable ? ?Post vital signs: Reviewed and stable ? ?Last Vitals:  ?Vitals Value Taken Time  ?BP    ?Temp    ?Pulse    ?Resp    ?SpO2    ? ? ?Last Pain:  ?Vitals:  ? 01/25/22 1045  ?TempSrc: Temporal  ?PainSc: 0-No pain  ?   ? ?  ? ?Complications: No notable events documented. ?

## 2022-01-25 NOTE — Discharge Instructions (Signed)

## 2022-01-25 NOTE — Anesthesia Postprocedure Evaluation (Signed)
Anesthesia Post Note ? ?Patient: Keith Fisher ? ?Procedure(s) Performed: CARDIOVERSION ? ?  ? ?Patient location during evaluation: PACU ?Anesthesia Type: General ?Level of consciousness: awake and alert ?Pain management: pain level controlled ?Vital Signs Assessment: post-procedure vital signs reviewed and stable ?Respiratory status: spontaneous breathing, nonlabored ventilation, respiratory function stable and patient connected to nasal cannula oxygen ?Cardiovascular status: blood pressure returned to baseline and stable ?Postop Assessment: no apparent nausea or vomiting ?Anesthetic complications: no ? ? ?No notable events documented. ? ?Last Vitals:  ?Vitals:  ? 01/25/22 1135 01/25/22 1136  ?BP: (!) 91/49 (!) 98/49  ?Pulse: (!) 59 (!) 55  ?Resp: 17 17  ?Temp:    ?SpO2: 100% 100%  ?  ?Last Pain:  ?Vitals:  ? 01/25/22 1045  ?TempSrc: Temporal  ?PainSc: 0-No pain  ? ? ?  ?  ?  ?  ?  ?  ? ?Effie Berkshire ? ? ? ? ?

## 2022-01-25 NOTE — H&P (Signed)
OV 01/12/2022 copied for documentation ? ? ? ?Primary Physician/Referring:  London Pepper, MD ? ?Patient ID: Keith Fisher, male    DOB: 06/26/1946, 76 y.o.   MRN: [id number] ? ?Chief Complaint  ?Patient presents with  ? Atrial Fibrillation  ? Follow-up  ?  2 week  ? ?HPI:   ? ?Keith Fisher  is a 76 y.o. with history of filaria since and has chronic lymphedema involving his right leg, mild chronic dyspnea, mitral valve prolapse with moderate to severe mitral regurgitation moderate to severe tricuspid regurgitation by echocardiogram in 2020, fairly active and chronic bradycardia asymptomatic by EKG, referred to me for evaluation of new onset atrial fibrillation, I saw him on 10/12/2021.  He underwent right and left heart catheterization and TEE guided direct-current cardioversion on 11/22/2021  ? ?He had undergone repeat direct-current cardioversion for atrial flutter on 12/26/2021.  However he has discontinued taking amiodarone his dyspnea has remained stable on much improved since initial cardioversion on 11/22/2021.  No leg edema, except for his right leg lymphedema.  His main concern today is severe arthritis in his left knee.  He has been limping. ? ?Past Medical History:  ?Diagnosis Date  ? Arthritis   ? Dyspnea   ? Lymphatic filariasis   ? Mitral valve regurgitation   ? Pulmonary hypertension ([place])   ? Tricuspid regurgitation   ? ?Past Surgical History:  ?Procedure Laterality Date  ? BUBBLE STUDY  11/22/2021  ? Procedure: BUBBLE STUDY;  Surgeon: Adrian Prows, MD;  Location: [place];  Service: Cardiovascular;;  ? CARDIOVERSION N/A 11/22/2021  ? Procedure: CARDIOVERSION;  Surgeon: Adrian Prows, MD;  Location: [place];  Service: Cardiovascular;  Laterality: N/A;  ? CARDIOVERSION N/A 12/27/2021  ? Procedure: CARDIOVERSION;  Surgeon: Adrian Prows, MD;  Location: [place];  Service: Cardiovascular;  Laterality: N/A;  ? INGUINAL HERNIA REPAIR Right 06/13/2021  ? Procedure: LAPAROSCOPIC RIGHT INGUINAL HERNIA REPAIR WITH MESH;   Surgeon: Ralene Ok, MD;  Location: [place];  Service: General;  Laterality: Right;  ? INSERTION OF MESH Right 06/13/2021  ? Procedure: INSERTION OF MESH;  Surgeon: Ralene Ok, MD;  Location: [place];  Service: General;  Laterality: Right;  ? POSTERIOR CERVICAL FUSION/FORAMINOTOMY N/A 05/14/2019  ? Procedure: Posterior Cervical Fusion and decompression with lateral mass fixation - Cervical Three-Four,Cervical Four-Five, Cervical Five-Six, Cervical Six-Seven.;  Surgeon: Kary Kos, MD;  Location: [place];  Service: Neurosurgery;  Laterality: N/A;  posterior  ? RIGHT/LEFT HEART CATH AND CORONARY ANGIOGRAPHY N/A 11/22/2021  ? Procedure: RIGHT/LEFT HEART CATH AND CORONARY ANGIOGRAPHY;  Surgeon: Adrian Prows, MD;  Location: [place] CV LAB;  Service: Cardiovascular;  Laterality: N/A;  ? SHOULDER SURGERY    ? TEE WITHOUT CARDIOVERSION N/A 03/06/2017  ? Procedure: TRANSESOPHAGEAL ECHOCARDIOGRAM (TEE);  Surgeon: Adrian Prows, MD;  Location: [place];  Service: Cardiovascular;  Laterality: N/A;  ? TEE WITHOUT CARDIOVERSION N/A 11/22/2021  ? Procedure: TRANSESOPHAGEAL ECHOCARDIOGRAM (TEE);  Surgeon: Adrian Prows, MD;  Location: [place];  Service: Cardiovascular;  Laterality: N/A;  ? ?History reviewed. No pertinent family history.  ?Social History  ? ?Tobacco Use  ? Smoking status: Never  ? Smokeless tobacco: Never  ?Substance Use Topics  ? Alcohol use: No  ? ?Marital Status: Married  ?ROS  ?Review of Systems  ?Cardiovascular:  Positive for leg swelling (right leg). Negative for chest pain and dyspnea on exertion.  ?Musculoskeletal:  Positive for joint pain (left knee).  ?Objective  ?Blood pressure (!) 153/90, pulse 91, temperature 98 ?F (  36.7 ?C), resp. rate 16, height 5\' 10"  (1.778 m), weight 185 lb (83.9 kg), SpO2 100 %. Body mass index is 26.54 kg/m?.  ? ?  01/12/2022  ?  1:21 PM 01/12/2022  ?  1:13 PM 12/27/2021  ? 12:34 PM  ?Vitals with BMI  ?Height  5\' 10"    ?Weight  185 lbs   ?BMI  26.54   ?Systolic 0000000 [id number]   ?Diastolic 90 99 77  ?Pulse 91 82 50  ?  ?Physical Exam ?Neck:  ?   Vascular: No carotid bruit or JVD.  ?Cardiovascular:  ?   Rate and Rhythm: Normal rate. Rhythm irregular.  ?   Pulses: Normal pulses and intact distal pulses.  ?   Heart sounds: No murmur heard. ?  No gallop. No S3 or S4 sounds.  ?Pulmonary:  ?   Effort: Pulmonary effort is normal.  ?   Breath sounds: Normal breath sounds.  ?Abdominal:  ?   General: Bowel sounds are normal.  ?   Palpations: Abdomen is soft.  ?Musculoskeletal:  ?   Right lower leg: Edema (Lymphedema chronic) present.  ?   Left lower leg: No edema.  ?Skin: ?   Capillary Refill: Capillary refill takes less than 2 seconds.  ?  ?Medications and allergies  ? ?Allergies  ?Allergen Reactions  ? Digoxin And Related Anxiety  ?  A combination of metoprolol and digoxin caused anxiety  ? Metoprolol Anxiety  ?  A combination of metoprolol and digoxin caused anxiety  ?  ? ?Medication list after today's encounter  ? ?Current Outpatient Medications:  ?  acetaminophen (TYLENOL) 500 MG tablet, Take 500 mg by mouth every 6 (six) hours as needed for moderate pain., Disp: , Rfl:  ?  gabapentin (NEURONTIN) 400 MG capsule, TAKE 1 CAPSULE BY MOUTH THREE TIMES DAILY, Disp: 240 capsule, Rfl: 1 ?  Magnesium 250 MG TABS, Take 250 mg by mouth daily., Disp: , Rfl:  ?  Multiple Vitamin (MULTIVITAMIN WITH MINERALS) TABS tablet, Take 1 tablet by mouth daily., Disp: , Rfl:  ?  traMADol (ULTRAM) 50 MG tablet, Take 1 tablet (50 mg total) by mouth every 6 (six) hours as needed., Disp: 60 tablet, Rfl: 0 ?  XARELTO 20 MG TABS tablet, Take 1 tablet (20 mg total) by mouth daily with supper., Disp: 30 tablet, Rfl: 3 ?  amiodarone (PACERONE) 200 MG tablet, Take 1 tablet (200 mg total) by mouth daily. 1 tablet 3 times a day x 1 week, 1 tablet 2 times a day x 1 week then one tab a day, Disp: 90 tablet, Rfl: 3 ? ?Laboratory examination:  ? ?Recent Labs  ?  06/13/21 ?[id number] 11/17/21 ?1321 11/22/21 ?1429 11/22/21 ?1432  11/22/21 ?1433 12/22/21 ?0830  ?NA 136 138   < > 140 139 135  ?K 4.0 4.6   < > 4.0 4.0 4.6  ?CL 105 104  --   --   --  101  ?CO2 23 22  --   --   --  25  ?GLUCOSE 100* 131*  --   --   --  102*  ?BUN 10 19  --   --   --  16  ?CREATININE 1.14 1.28*  --   --   --  1.39*  ?CALCIUM 9.1 9.4  --   --   --  9.6  ?GFRNONAA >60  --   --   --   --   --   ? < > = values  in this interval not displayed.  ? ?CrCl cannot be calculated (Patient's most recent lab result is older than the maximum 21 days allowed.).  ? ?  Latest Ref Rng & Units 12/22/2021  ?  8:30 AM 11/22/2021  ?  2:33 PM 11/22/2021  ?  2:32 PM  ?CMP  ?Glucose 70 - 99 mg/dL 102      ?BUN 8 - 27 mg/dL 16      ?Creatinine 0.76 - 1.27 mg/dL 1.39      ?Sodium 134 - 144 mmol/L 135   139   140    ?Potassium 3.5 - 5.2 mmol/L 4.6   4.0   4.0    ?Chloride 96 - 106 mmol/L 101      ?CO2 20 - 29 mmol/L 25      ?Calcium 8.6 - 10.2 mg/dL 9.6      ? ? ?  Latest Ref Rng & Units 12/22/2021  ?  8:30 AM 11/22/2021  ?  2:33 PM 11/22/2021  ?  2:32 PM  ?CBC  ?WBC 3.4 - 10.8 x10E3/uL 6.7      ?Hemoglobin 13.0 - 17.7 g/dL 13.7   11.6   11.6    ?Hematocrit 37.5 - 51.0 % 40.1   34.0   34.0    ?Platelets 150 - 450 x10E3/uL 228      ? ?External labs:  ? ?Labs 09/27/2021: ? ?A1c 6.3%.  TSH 1.40, normal. ? ?Hb 13.2/HCT 31.3, platelets 240, normal indicis. ? ?Serum glucose 99 mg, BUN 16, creatinine 1.14, potassium 4.9, LFTs normal, CMP otherwise normal. ? ?Lipid profile from 2016: ?Total cholesterol 140, triglycerides 93, HDL 54, non-HDL cholesterol 87. ? ?Radiology:  ? ? ?Cardiac Studies:  ? ?TEE   [03/06/2017]:  ?Normal LV systolic function. Mild prolapse of the anterior MV leaflet with posteriorly directed Moderate MR. Moderate TR with moderate pulmonary hypertension. Mild biatrial enlargement and mild RV dilatation. Normal thoracic aorta. ? ?Nuclear stress test   [02/09/2017]:  ?1. The resting electrocardiogram demonstrated normal sinus rhythm, normal resting conduction, no resting arrhythmias and  normal rest repolarization. Stress EKG is non-diagnostic for ischemia as it a pharmacologic stress using Lexiscan. Stress symptoms included dyspnea. ?2. Myocardial perfusion imaging is normal. Overall left ventricular sy

## 2022-01-25 NOTE — CV Procedure (Signed)
Direct current cardioversion: ? ?Indication symptomatic: Symptomatic atrial flutter ? ?Procedure: Under deep sedation administered and monitored by anesthesiology, synchronized direct current cardioversion performed. Patient was delivered with 50 Joules of electricity X 1. ? ?There was 7 sec post conversion pause followed by sinus rhythm. Patient continued to have intermittent sinus pauses and junctional escape rhythm in 40s-50s. ? ?No immediate complication noted.  ? ?  ? ? ? ?[name], MD ?[facility] ?Pager: [phone] ?Office: [phone] ?If no answer Cell [phone] ?  ? ?

## 2022-01-25 NOTE — Interval H&P Note (Signed)
History and Physical Interval Note: ? ?01/25/2022 ?11:19 AM ? ?Keith Fisher  has presented today for surgery, with the diagnosis of AFLUTTER.  The various methods of treatment have been discussed with the patient and family. After consideration of risks, benefits and other options for treatment, the patient has consented to  Procedure(s): ?CARDIOVERSION (N/A) as a surgical intervention.  [name] history has been reviewed, patient examined, no change in status, stable for surgery.  I have reviewed the patient's chart and labs.  Questions were answered to the patient's satisfaction.   ? ? ?[place] ? ? ?

## 2022-01-25 NOTE — Anesthesia Preprocedure Evaluation (Signed)
Anesthesia Evaluation  ?Patient identified by MRN, date of birth, ID band ?Patient awake ? ? ? ?Reviewed: ?Allergy & Precautions, NPO status , Patient's Chart, lab work & pertinent test results ? ?Airway ?Mallampati: III ? ?TM Distance: >3 FB ?Neck ROM: Full ? ? ? Dental ? ?(+) Teeth Intact, Dental Advisory Given, Missing,  ?  ?Pulmonary ? ?  ?breath sounds clear to auscultation ? ? ? ? ? ? Cardiovascular ?negative cardio ROS ? ? ?Rhythm:Irregular Rate:Abnormal ? ?Echo: ?1. Left ventricular ejection fraction, by estimation, is 50 to 55%. The  ?left ventricle has low normal function. Left ventricular diastolic  ?function could not be evaluated.  ??2. Right ventricular systolic function is normal. The right ventricular  ?size is mildly enlarged. There is mildly elevated pulmonary artery  ?systolic pressure. The estimated right ventricular systolic pressure is  ?[id number] mmHg.  ??3. Left atrial size was severely dilated. No left atrial/left atrial  ?appendage thrombus was detected. The LAA emptying velocity was 30 cm/s.  ??4. Right atrial size was severely dilated.  ??5. A small pericardial effusion is present.  ??6. The mitral valve is normal in structure. Moderate mitral valve  ?regurgitation. There is mild prolapse of of the mitral valve.  ??7. The tricuspid valve is myxomatous. Tricuspid valve regurgitation is  ?moderate to severe.  ??8. The aortic valve is normal in structure. Aortic valve regurgitation is  ?not visualized. No aortic stenosis is present ?  ?Neuro/Psych ?negative neurological ROS ? negative psych ROS  ? GI/Hepatic ?negative GI ROS, Neg liver ROS,   ?Endo/Other  ?negative endocrine ROS ? Renal/GU ?negative Renal ROS  ? ?  ?Musculoskeletal ? ?(+) Arthritis ,  ? Abdominal ?Normal abdominal exam  (+)   ?Peds ? Hematology ?negative hematology ROS ?(+)   ?Anesthesia Other Findings ? ? Reproductive/Obstetrics ? ?  ? ? ? ? ? ? ? ? ? ? ? ? ? ?  ?  ? ? ? ? ? ? ? ? ?Anesthesia  Physical ?Anesthesia Plan ? ?ASA: 3 ? ?Anesthesia Plan: General  ? ?Post-op Pain Management:   ? ?Induction: Intravenous ? ?PONV Risk Score and Plan: 0 ? ?Airway Management Planned: Natural Airway and Simple Face Mask ? ?Additional Equipment: None ? ?Intra-op Plan:  ? ?Post-operative Plan:  ? ?Informed Consent: I have reviewed the patients History and Physical, chart, labs and discussed the procedure including the risks, benefits and alternatives for the proposed anesthesia with the patient or authorized representative who has indicated his/her understanding and acceptance.  ? ? ? ? ? ?Plan Discussed with: CRNA ? ?Anesthesia Plan Comments:   ? ? ? ? ? ? ?Anesthesia Quick Evaluation ? ?

## 2022-01-26 ENCOUNTER — Ambulatory Visit: Payer: 59 | Admitting: Student

## 2022-01-26 ENCOUNTER — Telehealth: Payer: Self-pay

## 2022-01-26 ENCOUNTER — Encounter: Payer: Self-pay | Admitting: Student

## 2022-01-26 VITALS — BP 121/64 | HR 50 | Temp 98.0°F | Resp 17 | Ht 70.0 in | Wt 195.2 lb

## 2022-01-26 DIAGNOSIS — R0609 Other forms of dyspnea: Secondary | ICD-10-CM

## 2022-01-26 DIAGNOSIS — I4819 Other persistent atrial fibrillation: Secondary | ICD-10-CM

## 2022-01-26 DIAGNOSIS — R0601 Orthopnea: Secondary | ICD-10-CM

## 2022-01-26 MED ORDER — FUROSEMIDE 40 MG PO TABS: 40.0000 mg | ORAL_TABLET | Freq: Every day | ORAL | 3 refills | Status: DC | PRN

## 2022-01-26 MED ORDER — POTASSIUM CHLORIDE CRYS ER 20 MEQ PO TBCR: 40.0000 meq | EXTENDED_RELEASE_TABLET | Freq: Every day | ORAL | 3 refills | Status: AC | PRN

## 2022-01-26 NOTE — Telephone Encounter (Signed)
If short of breath and belly is swollen, needs to come in

## 2022-01-26 NOTE — Telephone Encounter (Signed)
Called pt no answer left a vm to call back

## 2022-01-26 NOTE — Telephone Encounter (Signed)
Is this new since cardioversion? We could bring him in for an EKG to make sure no severe bradyarrhythmia post cardioversion. I have tagged Dr. Einar Gip on this message who sees him.  Thanks MJP

## 2022-01-26 NOTE — Telephone Encounter (Signed)
Pt son called to inform us that pt has a cardioversion yesterday with Dr. Rosemary Holms and pt has has swelling on stomach and SOB while sleeping on back last night. Pt had to sleep on his recliner Pt son would like to know if this is normal.

## 2022-01-26 NOTE — Telephone Encounter (Signed)
Called pts son, no answer. Left vm requesting call back.

## 2022-01-26 NOTE — Progress Notes (Signed)
Primary Physician/Referring:  London Pepper, MD  Patient ID: Keith Fisher, male    DOB: July 24, 1946, 76 y.o.   MRN: [id number]  Chief Complaint  Patient presents with   cardioversion follow up   Abnormal ECG   HPI:    Keith Fisher  is a 76 y.o. with history of filaria since and has chronic lymphedema involving his right leg, mild chronic dyspnea, mitral valve prolapse with moderate to severe mitral regurgitation moderate to severe tricuspid regurgitation by echocardiogram in 2020, fairly active and chronic bradycardia asymptomatic by EKG.   Patient was originally referred given new onset atrial fibrillation 10/12/2021.  He underwent right and left heart catheterization and TEE guided direct current cardioversion on 11/22/2021.  He has had multiple recurrences of atrial fibrillation/flutter requiring repeat cardioversion 12/26/2021 and again 01/25/2022.  Patient presents for urgent visit at his request with concerns of worsening dyspnea on exertion and orthopnea since following cardioversion yesterday.  Is also noticed mild chest discomfort and abdominal swelling.  Patient typically walks on a regular basis and has been unable to do so for the last 2 days.  Denies syncope, near syncope.  Past Medical History:  Diagnosis Date   Arthritis    Dyspnea    Lymphatic filariasis    Mitral valve regurgitation    Pulmonary hypertension (HCC)    Tricuspid regurgitation    Past Surgical History:  Procedure Laterality Date   BUBBLE STUDY  11/22/2021   Procedure: BUBBLE STUDY;  Surgeon: Adrian Prows, MD;  Location: [place];  Service: Cardiovascular;;   CARDIOVERSION N/A 11/22/2021   Procedure: CARDIOVERSION;  Surgeon: Adrian Prows, MD;  Location: [facility] ENDOSCOPY;  Service: Cardiovascular;  Laterality: N/A;   CARDIOVERSION N/A 12/27/2021   Procedure: CARDIOVERSION;  Surgeon: Adrian Prows, MD;  Location: [facility] ENDOSCOPY;  Service: Cardiovascular;  Laterality: N/A;   CARDIOVERSION N/A 01/25/2022   Procedure:  CARDIOVERSION;  Surgeon: Nigel Mormon, MD;  Location: [place] ENDOSCOPY;  Service: Cardiovascular;  Laterality: N/A;   INGUINAL HERNIA REPAIR Right 06/13/2021   Procedure: LAPAROSCOPIC RIGHT INGUINAL HERNIA REPAIR WITH MESH;  Surgeon: Ralene Ok, MD;  Location: [place];  Service: General;  Laterality: Right;   INSERTION OF MESH Right 06/13/2021   Procedure: INSERTION OF MESH;  Surgeon: Ralene Ok, MD;  Location: [place];  Service: General;  Laterality: Right;   POSTERIOR CERVICAL FUSION/FORAMINOTOMY N/A 05/14/2019   Procedure: Posterior Cervical Fusion and decompression with lateral mass fixation - Cervical Three-Four,Cervical Four-Five, Cervical Five-Six, Cervical Six-Seven.;  Surgeon: Kary Kos, MD;  Location: [place];  Service: Neurosurgery;  Laterality: N/A;  posterior   RIGHT/LEFT HEART CATH AND CORONARY ANGIOGRAPHY N/A 11/22/2021   Procedure: RIGHT/LEFT HEART CATH AND CORONARY ANGIOGRAPHY;  Surgeon: Adrian Prows, MD;  Location: [place] CV LAB;  Service: Cardiovascular;  Laterality: N/A;   SHOULDER SURGERY     TEE WITHOUT CARDIOVERSION N/A 03/06/2017   Procedure: TRANSESOPHAGEAL ECHOCARDIOGRAM (TEE);  Surgeon: Adrian Prows, MD;  Location: [place];  Service: Cardiovascular;  Laterality: N/A;   TEE WITHOUT CARDIOVERSION N/A 11/22/2021   Procedure: TRANSESOPHAGEAL ECHOCARDIOGRAM (TEE);  Surgeon: Adrian Prows, MD;  Location: [place];  Service: Cardiovascular;  Laterality: N/A;   History reviewed. No pertinent family history.  Social History   Tobacco Use   Smoking status: Never   Smokeless tobacco: Never  Substance Use Topics   Alcohol use: No   Marital Status: Married  ROS  Review of Systems  Constitutional: Positive for malaise/fatigue.  Cardiovascular:  Positive for dyspnea on  exertion, leg swelling (right leg (chronic)) and orthopnea. Negative for chest pain, claudication, near-syncope, palpitations, paroxysmal nocturnal dyspnea and syncope.  Musculoskeletal:  Positive  for joint pain (left knee).  Neurological:  Negative for dizziness.   Objective  Blood pressure 121/64, pulse (!) 50, temperature 98 F (36.7 C), temperature source Temporal, resp. rate 17, height _0  (1.778 m), weight 195 lb 3.2 oz (88.5 kg), SpO2 100 %. Body mass index is 28.01 kg/m.     01/26/2022    2:37 PM 01/25/2022   12:05 PM 01/25/2022   11:50 AM  Vitals with BMI  Height _1     Weight 195 lbs 3 oz    BMI 16.10    Systolic 960 454 098  Diastolic 64 71 52  Pulse 50 50 54    Physical Exam Vitals reviewed.  Neck:     Vascular: No carotid bruit or JVD.  Cardiovascular:     Rate and Rhythm: Normal rate. Rhythm irregular.     Pulses: Normal pulses and intact distal pulses.     Heart sounds: No murmur heard.   No gallop. No S3 or S4 sounds.  Pulmonary:     Effort: Pulmonary effort is normal.     Breath sounds: Rales (bilateral bases) present.     Comments: Dyspneic while speaking on exam  Musculoskeletal:     Right lower leg: Edema (Lymphedema chronic) present.     Left lower leg: Edema (minimal) present.  Skin:    Capillary Refill: Capillary refill takes less than 2 seconds.   Allergies   Allergies  Allergen Reactions   Digoxin And Related Anxiety    A combination of metoprolol and digoxin caused anxiety   Metoprolol Anxiety    A combination of metoprolol and digoxin caused anxiety    Medications Prior to Visit:   Outpatient Medications Prior to Visit  Medication Sig Dispense Refill   acetaminophen (TYLENOL) 500 MG tablet Take 500 mg by mouth every 6 (six) hours as needed for moderate pain.     acetaminophen-codeine (TYLENOL #3) 300-30 MG tablet Take 1 tablet by mouth every 8 (eight) hours as needed for moderate pain. 30 tablet 0   gabapentin (NEURONTIN) 400 MG capsule TAKE 1 CAPSULE BY MOUTH THREE TIMES DAILY 240 capsule 1   traMADol (ULTRAM) 50 MG tablet Take 1 tablet (50 mg total) by mouth every 6 (six) hours as needed. 60 tablet 0   XARELTO 20 MG TABS  tablet Take 1 tablet (20 mg total) by mouth daily with supper. 30 tablet 3   amiodarone (PACERONE) 200 MG tablet Take 0.5 tablets (100 mg total) by mouth every other day. 1 tablet 3 times a day x 1 week, 1 tablet 2 times a day x 1 week then one tab a day 90 tablet 3   No facility-administered medications prior to visit.   Final Medications at End of Visit    Current Meds  Medication Sig   furosemide (LASIX) 40 MG tablet Take 1 tablet (40 mg total) by mouth daily as needed for fluid or edema.   potassium chloride SA (KLOR-CON M20) 20 MEQ tablet Take 2 tablets (40 mEq total) by mouth daily as needed (with Lasix).    Laboratory examination:   Recent Labs    06/13/21 0839 11/17/21 1321 11/22/21 1429 11/22/21 1433 12/22/21 0830 01/25/22 1102  NA 136 138   < > 139 135 138  K 4.0 4.6   < > 4.0 4.6 4.3  CL 105 104  --   --  101 102  CO2 23 22  --   --  25  --   GLUCOSE 100* 131*  --   --  102* 115*  BUN 10 19  --   --  16 18  CREATININE 1.14 1.28*  --   --  1.39* 1.50*  CALCIUM 9.1 9.4  --   --  9.6  --   GFRNONAA >60  --   --   --   --   --    < > = values in this interval not displayed.   estimated creatinine clearance is 49.1 mL/min (A) (by C-G formula based on SCr of 1.5 mg/dL (H)).     Latest Ref Rng & Units 01/25/2022   11:02 AM 12/22/2021    8:30 AM 11/22/2021    2:33 PM  CMP  Glucose 70 - 99 mg/dL 115   102     BUN 8 - 23 mg/dL 18   16     Creatinine 0.61 - 1.24 mg/dL 1.50   1.39     Sodium 135 - 145 mmol/L 138   135   139    Potassium 3.5 - 5.1 mmol/L 4.3   4.6   4.0    Chloride 98 - 111 mmol/L 102   101     CO2 20 - 29 mmol/L  25     Calcium 8.6 - 10.2 mg/dL  9.6         Latest Ref Rng & Units 01/25/2022   11:02 AM 12/22/2021    8:30 AM 11/22/2021    2:33 PM  CBC  WBC 3.4 - 10.8 x10E3/uL  6.7     Hemoglobin 13.0 - 17.0 g/dL 12.2   13.7   11.6    Hematocrit 39.0 - 52.0 % 36.0   40.1   34.0    Platelets 150 - 450 x10E3/uL  228      External labs:    09/27/2021: A1c 6.3%.  TSH 1.40, normal. Hb 13.2/HCT 31.3, platelets 240, normal indicis. Serum glucose 99 mg, BUN 16, creatinine 1.14, potassium 4.9, LFTs normal, CMP otherwise normal.  Lipid profile from 2016: Total cholesterol 140, triglycerides 93, HDL 54, non-HDL cholesterol 87.  Radiology:  No results found.  Cardiac Studies:   TEE   [03/06/2017]:  Normal LV systolic function. Mild prolapse of the anterior MV leaflet with posteriorly directed Moderate MR. Moderate TR with moderate pulmonary hypertension. Mild biatrial enlargement and mild RV dilatation. Normal thoracic aorta.  Nuclear stress test   [02/09/2017]:  1. The resting electrocardiogram demonstrated normal sinus rhythm, normal resting conduction, no resting arrhythmias and normal rest repolarization. Stress EKG is non-diagnostic for ischemia as it a pharmacologic stress using Lexiscan. Stress symptoms included dyspnea. 2. Myocardial perfusion imaging is normal. Overall left ventricular systolic function was normal without regional wall motion abnormalities. The left ventricular ejection fraction was 75%.  PCV ECHOCARDIOGRAM COMPLETE 10/27/2021 1. Normal LV systolic function with EF 57%. Left ventricle cavity is normal in size. Normal global wall motion. Indeterminate diastolic filling pattern, elevated LAP. Calculated EF 57%. 2. Left atrial cavity is moderately dilated. Right atrial cavity is severely dilated. 3. Right ventricle cavity is severely dilated. Normal right ventricular function. Mild mitral valve leaflet thickening. Mild prolapse of the mitral valve leaflets. I cannot exclude flail leaflet. Severe (Grade IV) mitral regurgitation. 4. Structurally normal tricuspid valve.  Annular dilatation. Severe tricuspid regurgitation.  Mild pulmonary hypertension. RVSP measures 38 mmHg. 5. IVC is dilated with poor inspiration collapse  consistent with elevated right atrial pressure. Compared to the study done on 04/29/2019,  atrial fibrillation is new.  Previously left atrium mildly dilated.  Mild prolapse with moderate MR. Previously RA moderately dilated.  TR was moderate to severe with moderate pulmonary hypertension.  TEE: Under MAC anesthesis, TEE was performed without complications 16/06/9603:: LV: Normal size. Low normal  EF. RV: Mildly dilated, normal RV function LA: Severely enlarged. Left atrial appendage: Normal without thrombus. Normal function. Inter atrial septum is intact without defect. Double contrast study negative for atrial level shunting. No late appearance of bubbles either. RA: Severely dilated   MV: Mild MVP. Moderate central MR without reversal of flow in the pulmonary veins TV: Mild annular dilatation. Prolapse of the lateral leaflet with moderate to severe TR. Mild pulmonary hypertension (RVSP 35 mm Hg) AV: Normal. No AI or AS. PV: Normal. Trace PI.   Pulmonary artery: Normal sized.  Thoracic and ascending aorta: Normal without significant plaque or atheromatous changes.     Direct-current cardioversion: Patient was deeply sedated with propofol, following TEE, direct-current cardioversion was performed x2, 150 J x 2 with success to normal sinus rhythm with frequent PACs and brief episodes of atrial tachycardia.  Patient tolerated the procedure well.  Right and left heart catheterization 11/22/2021: Procedural data:  RA pressure 15/13 mean 11 mm mercury.  RV pressure 33/9 and Right ventricular EDP 12 mm Hg. PA pressure 30/17 with a mean of 19 mm mercury. PA saturation 59%.  Pulmonary capillary wedge 16/15 with a mean of 11 mm Hg. Aortic saturation 100%.  Cardiac output was 3.7 with cardiac index of 1.82 by Fick.  QP/QS 1.00   LV: 96/51, EDP 13 mmHg.  Ao 91/72, mean 75 mmHg.  No pressure gradient across the aortic valve.  LVEF 45 to 50% with mild global hypokinesis. Coronary angiogram: Left dominant circulation.  Large left main, large LAD gives origin to large D1, no significant  disease.  Circumflex is very large, large OM1 and large PDA branches.  RI is small to moderate vessel, free of disease. RCA: Nondominant.  Normal.  Impression: Normal coronary arteries, left dominant circulation.  Low normal LVEF at 50%.  Mild global hypokinesis. Markedly reduced cardiac output and cardiac index, probably related to frequent PVCs and PACs.  20 mL contrast utilized.  Direct current cardioversion 01/25/2022:  Indication symptomatic: Symptomatic atrial flutter  Procedure: Under deep sedation administered and monitored by anesthesiology, synchronized direct current cardioversion performed. Patient was delivered with 50 Joules of electricity X 1.  There was 7 sec post conversion pause followed by sinus rhythm. Patient continued to have intermittent sinus pauses and junctional escape rhythm in 40s-50s.  No immediate complication noted.   EKG:  01/26/2022: Marked sinus bradycardia at a rate of 48 bpm with PACs and intermittent junctional escape.  Left atrial enlargement.  EKG 01/12/2022: Typical atrial flutter with 3: 1 conduction, ventricular rate 118 bpm, normal axis, incomplete right bundle branch block.  Poor R wave progression, nonspecific T abnormality.  No significant change from 12/08/2021.  EKG 12/08/2021: Atypical atrial flutter with variable AV conduction at rate of 92 bpm.  Poor R wave progression.  Nonspecific T abnormality.  EKG 10/12/2021: Atrial fibrillation with rapid ventricular sponsor at rate of 118 bpm, low voltage complexes, nonspecific T abnormality.    Assessment     ICD-10-CM   1. Persistent atrial fibrillation (HCC)  I48.19 EKG 12-Lead    2. Dyspnea on exertion  R06.09 PCV ECHOCARDIOGRAM COMPLETE    DG  Chest 2 View    3. Orthopnea  O83.25 Basic metabolic panel    Brain natriuretic peptide    PCV ECHOCARDIOGRAM COMPLETE    DG Chest 2 View      Medications Discontinued During This Encounter  Medication Reason   amiodarone (PACERONE) 200 MG tablet  Discontinued by provider     Meds ordered this encounter  Medications   furosemide (LASIX) 40 MG tablet    Sig: Take 1 tablet (40 mg total) by mouth daily as needed for fluid or edema.    Dispense:  90 tablet    Refill:  3   potassium chloride SA (KLOR-CON M20) 20 MEQ tablet    Sig: Take 2 tablets (40 mEq total) by mouth daily as needed (with Lasix).    Dispense:  90 tablet    Refill:  3   Orders Placed This Encounter  Procedures   DG Chest 2 View    Standing Status:   Future    Standing Expiration Date:   03/28/2022    Order Specific Question:   Reason for Exam (SYMPTOM  OR DIAGNOSIS REQUIRED)    Answer:   dyspnea on exertion    Order Specific Question:   Preferred imaging location?    Answer:   [facility]    Order Specific Question:   Radiology Contrast Protocol - do NOT remove file path    Answer:   [email or web address]   Basic metabolic panel   Brain natriuretic peptide   EKG 12-Lead   PCV ECHOCARDIOGRAM COMPLETE    Standing Status:   Future    Standing Expiration Date:   01/27/2023   Recommendations:   Keith Fisher is a 76 y.o. Asian [4] male with history of filaria since and has chronic lymphedema involving his right leg, mild chronic dyspnea, mitral valve prolapse with moderate to severe mitral regurgitation moderate to severe tricuspid regurgitation by echocardiogram in 2020, fairly active and chronic bradycardia asymptomatic by EKG.   Patient was originally referred given new onset atrial fibrillation 10/12/2021.  He underwent right and left heart catheterization and TEE guided direct current cardioversion on 11/22/2021.  He has had multiple recurrences of atrial fibrillation/flutter requiring repeat cardioversion 12/26/2021 and again 01/25/2022.  Patient presents for urgent visit at his request with concerns of worsening dyspnea on exertion and orthopnea since following cardioversion yesterday.  Patient appears volume  overloaded on exam, will therefore start Lasix 40 mg p.o. daily for the next 5 days with potassium supplement accordingly.  We will obtain BMP and BNP now as well as repeat BMP and BNP in 1 week.  We will also obtain repeat echocardiogram given clinical status change with volume overload.  Given marked bradycardia will discontinue amiodarone.  We will also obtain chest x-ray to evaluate for underlying pneumonitis as reaction to amiodarone dosing although patient is only on very low-dose presently.  Follow-up in 1 to 2 weeks, sooner if needed.  Patient was seen in collaboration with Dr. Einar Gip. He also reviewed patient's chart and examined the patient. Dr. Einar Gip is in agreement of the plan.    Alethia Berthold, PA-C 01/27/2022, 9:52 AM Office: [phone]

## 2022-01-27 ENCOUNTER — Ambulatory Visit
Admission: RE | Admit: 2022-01-27 | Discharge: 2022-01-27 | Disposition: A | Discharge status: Home / Self Care | Payer: 59 | Source: Ambulatory Visit | Attending: Student | Admitting: Student

## 2022-01-27 DIAGNOSIS — R0601 Orthopnea: Secondary | ICD-10-CM

## 2022-01-27 DIAGNOSIS — R0609 Other forms of dyspnea: Secondary | ICD-10-CM

## 2022-01-31 ENCOUNTER — Ambulatory Visit: Payer: 59

## 2022-01-31 DIAGNOSIS — R0609 Other forms of dyspnea: Secondary | ICD-10-CM

## 2022-01-31 DIAGNOSIS — R0601 Orthopnea: Secondary | ICD-10-CM

## 2022-01-31 LAB — BASIC METABOLIC PANEL
BUN/Creatinine Ratio: 14 (ref 10–24)
BUN: 18 mg/dL (ref 8–27)
CO2: 20 mmol/L (ref 20–29)
Calcium: 8.7 mg/dL (ref 8.6–10.2)
Chloride: 103 mmol/L (ref 96–106)
Creatinine, Ser: 1.33 mg/dL — ABNORMAL HIGH (ref 0.76–1.27)
Glucose: 127 mg/dL — ABNORMAL HIGH (ref 70–99)
Potassium: 4.2 mmol/L (ref 3.5–5.2)
Sodium: 138 mmol/L (ref 134–144)
eGFR: 56 mL/min/{1.73_m2} — ABNORMAL LOW (ref >59)

## 2022-01-31 LAB — BRAIN NATRIURETIC PEPTIDE: BNP: 622.9 pg/mL — ABNORMAL HIGH (ref 0.0–100.0)

## 2022-01-31 NOTE — Progress Notes (Unsigned)
Primary Physician/Referring:  London Pepper, MD  Patient ID: Keith Fisher, male    DOB: 11/20/1945, 76 y.o.   MRN: [id number]  No chief complaint on file.  HPI:    Keith Fisher  is a 76 y.o. with history of filaria since and has chronic lymphedema involving his right leg, mild chronic dyspnea, mitral valve prolapse with moderate to severe mitral regurgitation moderate to severe tricuspid regurgitation by echocardiogram in 2020, fairly active and chronic bradycardia asymptomatic by EKG.   Patient was originally referred given new onset atrial fibrillation 10/12/2021.  He underwent right and left heart catheterization and TEE guided direct current cardioversion on 11/22/2021.  He has had multiple recurrences of atrial fibrillation/flutter requiring repeat cardioversion 12/26/2021 and again 01/25/2022.  Patient was seen in our office 01/26/2022 with complaints of fatigue and orthopnea following cardioversion on 5/17.  Given marked bradycardia discontinued amiodarone and given volume overload on exam started patient on Lasix and potassium, ordered echocardiogram, and lab testing.  BMP remained stable, BNP was elevated.  Echocardiogram results are pending.  Also ordered chest x-ray to evaluate for underlying pneumonitis as a reaction to amiodarone.  Chest x-ray did reveal nodular opacity in the right lower lung and right infrahilar airspace opacity.  Results of chest x-ray have been sent to patient's PCP for further evaluation and management.  Patient now presents for follow-up.  He states orthopnea has completely resolved and energy level has significantly improved.  Denies chest discomfort, dyspnea.  Reports abdominal swelling has also resolved.  He is now able to walk as he did previously without issue.  Past Medical History:  Diagnosis Date   Arthritis    Dyspnea    Lymphatic filariasis    Mitral valve regurgitation    Pulmonary hypertension (HCC)    Tricuspid regurgitation    Past Surgical History:   Procedure Laterality Date   BUBBLE STUDY  11/22/2021   Procedure: BUBBLE STUDY;  Surgeon: Adrian Prows, MD;  Location: [place];  Service: Cardiovascular;;   CARDIOVERSION N/A 11/22/2021   Procedure: CARDIOVERSION;  Surgeon: Adrian Prows, MD;  Location: [facility] ENDOSCOPY;  Service: Cardiovascular;  Laterality: N/A;   CARDIOVERSION N/A 12/27/2021   Procedure: CARDIOVERSION;  Surgeon: Adrian Prows, MD;  Location: [facility] ENDOSCOPY;  Service: Cardiovascular;  Laterality: N/A;   CARDIOVERSION N/A 01/25/2022   Procedure: CARDIOVERSION;  Surgeon: Nigel Mormon, MD;  Location: [place] ENDOSCOPY;  Service: Cardiovascular;  Laterality: N/A;   INGUINAL HERNIA REPAIR Right 06/13/2021   Procedure: LAPAROSCOPIC RIGHT INGUINAL HERNIA REPAIR WITH MESH;  Surgeon: Ralene Ok, MD;  Location: [place];  Service: General;  Laterality: Right;   INSERTION OF MESH Right 06/13/2021   Procedure: INSERTION OF MESH;  Surgeon: Ralene Ok, MD;  Location: [place];  Service: General;  Laterality: Right;   POSTERIOR CERVICAL FUSION/FORAMINOTOMY N/A 05/14/2019   Procedure: Posterior Cervical Fusion and decompression with lateral mass fixation - Cervical Three-Four,Cervical Four-Five, Cervical Five-Six, Cervical Six-Seven.;  Surgeon: Kary Kos, MD;  Location: [place];  Service: Neurosurgery;  Laterality: N/A;  posterior   RIGHT/LEFT HEART CATH AND CORONARY ANGIOGRAPHY N/A 11/22/2021   Procedure: RIGHT/LEFT HEART CATH AND CORONARY ANGIOGRAPHY;  Surgeon: Adrian Prows, MD;  Location: [place] CV LAB;  Service: Cardiovascular;  Laterality: N/A;   SHOULDER SURGERY     TEE WITHOUT CARDIOVERSION N/A 03/06/2017   Procedure: TRANSESOPHAGEAL ECHOCARDIOGRAM (TEE);  Surgeon: Adrian Prows, MD;  Location: [place];  Service: Cardiovascular;  Laterality: N/A;   TEE WITHOUT CARDIOVERSION N/A 11/22/2021  Procedure: TRANSESOPHAGEAL ECHOCARDIOGRAM (TEE);  Surgeon: Adrian Prows, MD;  Location: [facility] ENDOSCOPY;  Service: Cardiovascular;  Laterality: N/A;   No  family history on file.  Social History   Tobacco Use   Smoking status: Never   Smokeless tobacco: Never  Substance Use Topics   Alcohol use: No   Marital Status: Married  ROS  Review of Systems  Constitutional: Negative for malaise/fatigue.  Cardiovascular:  Positive for leg swelling (right leg (chronic)). Negative for chest pain, claudication, dyspnea on exertion, near-syncope, orthopnea (resolved), palpitations, paroxysmal nocturnal dyspnea and syncope.  Musculoskeletal:  Positive for joint pain (left knee).  Neurological:  Negative for dizziness.   Objective  There were no vitals taken for this visit. There is no height or weight on file to calculate BMI.     01/26/2022    2:37 PM 01/25/2022   12:05 PM 01/25/2022   11:50 AM  Vitals with BMI  Height _0     Weight 195 lbs 3 oz    BMI 37.62    Systolic [phone]  Diastolic 64 71 52  Pulse 50 50 54    Orthostatic VS for the past 72 hrs (Last 3 readings):  Orthostatic BP Patient Position BP Location Cuff Size Orthostatic Pulse  02/02/22 1004 149/57 Standing Left Arm Normal (!) 43  02/02/22 1003 146/65 Sitting Left Arm Normal (!) 45  02/02/22 1002 144/68 Supine Left Arm Normal (!) 46  Resting heart rate 39 bpm Heart rate after 1 minute of walking 74 bpm  Physical Exam Vitals reviewed.  Neck:     Vascular: No carotid bruit or JVD.  Cardiovascular:     Rate and Rhythm: Normal rate and regular rhythm. Occasional Extrasystoles are present.    Pulses: Normal pulses and intact distal pulses.     Heart sounds: No murmur heard.   No gallop. No S3 or S4 sounds.  Pulmonary:     Effort: Pulmonary effort is normal.     Breath sounds: No rales.     Comments: Dyspneic while speaking on exam  Musculoskeletal:     Right lower leg: Edema (Lymphedema chronic) present.     Left lower leg: Edema (trace) present.   Allergies   Allergies  Allergen Reactions   Digoxin And Related Anxiety    A combination of metoprolol and  digoxin caused anxiety   Metoprolol Anxiety    A combination of metoprolol and digoxin caused anxiety    Medications Prior to Visit:   Outpatient Medications Prior to Visit  Medication Sig Dispense Refill   acetaminophen (TYLENOL) 500 MG tablet Take 500 mg by mouth every 6 (six) hours as needed for moderate pain.     acetaminophen-codeine (TYLENOL #3) 300-30 MG tablet Take 1 tablet by mouth every 8 (eight) hours as needed for moderate pain. 30 tablet 0   furosemide (LASIX) 40 MG tablet Take 1 tablet (40 mg total) by mouth daily as needed for fluid or edema. 90 tablet 3   gabapentin (NEURONTIN) 400 MG capsule TAKE 1 CAPSULE BY MOUTH THREE TIMES DAILY 240 capsule 1   potassium chloride SA (KLOR-CON M20) 20 MEQ tablet Take 2 tablets (40 mEq total) by mouth daily as needed (with Lasix). 90 tablet 3   traMADol (ULTRAM) 50 MG tablet Take 1 tablet (50 mg total) by mouth every 6 (six) hours as needed. 60 tablet 0   XARELTO 20 MG TABS tablet Take 1 tablet (20 mg total) by mouth daily with supper. 30 tablet 3  No facility-administered medications prior to visit.   Final Medications at End of Visit    No outpatient medications have been marked as taking for the 02/02/22 encounter (Appointment) with Rayetta Pigg, Walt Geathers C, PA-C.    Laboratory examination:   Recent Labs    06/13/21 0839 11/17/21 1321 11/22/21 1429 11/22/21 1433 12/22/21 0830 01/25/22 1102  NA 136 138   < > 139 135 138  K 4.0 4.6   < > 4.0 4.6 4.3  CL 105 104  --   --  101 102  CO2 23 22  --   --  25  --   GLUCOSE 100* 131*  --   --  102* 115*  BUN 10 19  --   --  16 18  CREATININE 1.14 1.28*  --   --  1.39* 1.50*  CALCIUM 9.1 9.4  --   --  9.6  --   GFRNONAA >60  --   --   --   --   --    < > = values in this interval not displayed.    estimated creatinine clearance is 49.1 mL/min (A) (by C-G formula based on SCr of 1.5 mg/dL (H)).     Latest Ref Rng & Units 01/25/2022   11:02 AM 12/22/2021    8:30 AM 11/22/2021    2:33  PM  CMP  Glucose 70 - 99 mg/dL 115   102     BUN 8 - 23 mg/dL 18   16     Creatinine 0.61 - 1.24 mg/dL 1.50   1.39     Sodium 135 - 145 mmol/L 138   135   139    Potassium 3.5 - 5.1 mmol/L 4.3   4.6   4.0    Chloride 98 - 111 mmol/L 102   101     CO2 20 - 29 mmol/L  25     Calcium 8.6 - 10.2 mg/dL  9.6         Latest Ref Rng & Units 01/25/2022   11:02 AM 12/22/2021    8:30 AM 11/22/2021    2:33 PM  CBC  WBC 3.4 - 10.8 x10E3/uL  6.7     Hemoglobin 13.0 - 17.0 g/dL 12.2   13.7   11.6    Hematocrit 39.0 - 52.0 % 36.0   40.1   34.0    Platelets 150 - 450 x10E3/uL  228      External labs:   09/27/2021: A1c 6.3%.  TSH 1.40, normal. Hb 13.2/HCT 31.3, platelets 240, normal indicis. Serum glucose 99 mg, BUN 16, creatinine 1.14, potassium 4.9, LFTs normal, CMP otherwise normal.  Lipid profile from 2016: Total cholesterol 140, triglycerides 93, HDL 54, non-HDL cholesterol 87.  Radiology:  No results found.  Chest x-ray 01/27/2022: 1. A 9 mm nodular opacity in the right lower lobe. Recommend further evaluation with a CT of the chest for better characterization. 2. Right infrahilar airspace opacity which may reflect atelectasis versus pneumonia.   Cardiac Studies:   TEE   [03/06/2017]:  Normal LV systolic function. Mild prolapse of the anterior MV leaflet with posteriorly directed Moderate MR. Moderate TR with moderate pulmonary hypertension. Mild biatrial enlargement and mild RV dilatation. Normal thoracic aorta.  Nuclear stress test   [02/09/2017]:  1. The resting electrocardiogram demonstrated normal sinus rhythm, normal resting conduction, no resting arrhythmias and normal rest repolarization. Stress EKG is non-diagnostic for ischemia as it a pharmacologic stress using Lexiscan. Stress symptoms included dyspnea.  2. Myocardial perfusion imaging is normal. Overall left ventricular systolic function was normal without regional wall motion abnormalities. The left ventricular ejection  fraction was 75%.  PCV ECHOCARDIOGRAM COMPLETE 10/27/2021 1. Normal LV systolic function with EF 57%. Left ventricle cavity is normal in size. Normal global wall motion. Indeterminate diastolic filling pattern, elevated LAP. Calculated EF 57%. 2. Left atrial cavity is moderately dilated. Right atrial cavity is severely dilated. 3. Right ventricle cavity is severely dilated. Normal right ventricular function. Mild mitral valve leaflet thickening. Mild prolapse of the mitral valve leaflets. I cannot exclude flail leaflet. Severe (Grade IV) mitral regurgitation. 4. Structurally normal tricuspid valve.  Annular dilatation. Severe tricuspid regurgitation.  Mild pulmonary hypertension. RVSP measures 38 mmHg. 5. IVC is dilated with poor inspiration collapse consistent with elevated right atrial pressure. Compared to the study done on 04/29/2019, atrial fibrillation is new.  Previously left atrium mildly dilated.  Mild prolapse with moderate MR. Previously RA moderately dilated.  TR was moderate to severe with moderate pulmonary hypertension.  TEE: Under MAC anesthesis, TEE was performed without complications 24/40/1027:: LV: Normal size. Low normal  EF. RV: Mildly dilated, normal RV function LA: Severely enlarged. Left atrial appendage: Normal without thrombus. Normal function. Inter atrial septum is intact without defect. Double contrast study negative for atrial level shunting. No late appearance of bubbles either. RA: Severely dilated   MV: Mild MVP. Moderate central MR without reversal of flow in the pulmonary veins TV: Mild annular dilatation. Prolapse of the lateral leaflet with moderate to severe TR. Mild pulmonary hypertension (RVSP 35 mm Hg) AV: Normal. No AI or AS. PV: Normal. Trace PI.   Pulmonary artery: Normal sized.  Thoracic and ascending aorta: Normal without significant plaque or atheromatous changes.     Direct-current cardioversion: Patient was deeply sedated with propofol,  following TEE, direct-current cardioversion was performed x2, 150 J x 2 with success to normal sinus rhythm with frequent PACs and brief episodes of atrial tachycardia.  Patient tolerated the procedure well.  Right and left heart catheterization 11/22/2021: Procedural data:  RA pressure 15/13 mean 11 mm mercury.  RV pressure 33/9 and Right ventricular EDP 12 mm Hg. PA pressure 30/17 with a mean of 19 mm mercury. PA saturation 59%.  Pulmonary capillary wedge 16/15 with a mean of 11 mm Hg. Aortic saturation 100%.  Cardiac output was 3.7 with cardiac index of 1.82 by Fick.  QP/QS 1.00   LV: 96/51, EDP 13 mmHg.  Ao 91/72, mean 75 mmHg.  No pressure gradient across the aortic valve.  LVEF 45 to 50% with mild global hypokinesis. Coronary angiogram: Left dominant circulation.  Large left main, large LAD gives origin to large D1, no significant disease.  Circumflex is very large, large OM1 and large PDA branches.  RI is small to moderate vessel, free of disease. RCA: Nondominant.  Normal.  Impression: Normal coronary arteries, left dominant circulation.  Low normal LVEF at 50%.  Mild global hypokinesis. Markedly reduced cardiac output and cardiac index, probably related to frequent PVCs and PACs.  20 mL contrast utilized.  Direct current cardioversion 01/25/2022:  Indication symptomatic: Symptomatic atrial flutter  Procedure: Under deep sedation administered and monitored by anesthesiology, synchronized direct current cardioversion performed. Patient was delivered with 50 Joules of electricity X 1.  There was 7 sec post conversion pause followed by sinus rhythm. Patient continued to have intermittent sinus pauses and junctional escape rhythm in 40s-50s.  No immediate complication noted.   EKG:  02/02/2022: Marked sinus bradycardia  with a single PAC at a rate of 49 bpm.  Left atrial enlargement  01/26/2022: Marked sinus bradycardia at a rate of 48 bpm with PACs and intermittent junctional escape.   Left atrial enlargement.  EKG 01/12/2022: Typical atrial flutter with 3: 1 conduction, ventricular rate 118 bpm, normal axis, incomplete right bundle branch block.  Poor R wave progression, nonspecific T abnormality.  No significant change from 12/08/2021.  EKG 12/08/2021: Atypical atrial flutter with variable AV conduction at rate of 92 bpm.  Poor R wave progression.  Nonspecific T abnormality.  EKG 10/12/2021: Atrial fibrillation with rapid ventricular sponsor at rate of 118 bpm, low voltage complexes, nonspecific T abnormality.    Assessment   No diagnosis found.   There are no discontinued medications.    No orders of the defined types were placed in this encounter.  No orders of the defined types were placed in this encounter.  Recommendations:   Keith Fisher is a 76 y.o. Asian [4] male with history of filaria since and has chronic lymphedema involving his right leg, mild chronic dyspnea, mitral valve prolapse with moderate to severe mitral regurgitation moderate to severe tricuspid regurgitation by echocardiogram in 2020, fairly active and chronic bradycardia asymptomatic by EKG.   Patient was originally referred given new onset atrial fibrillation 10/12/2021.  He underwent right and left heart catheterization and TEE guided direct current cardioversion on 11/22/2021.  He has had multiple recurrences of atrial fibrillation/flutter requiring repeat cardioversion 12/26/2021 and again 01/25/2022.  Patient was seen in our office 01/26/2022 with complaints of fatigue and orthopnea following cardioversion on 5/17.  Given marked bradycardia discontinued amiodarone and given volume overload on exam started patient on Lasix and potassium, ordered echocardiogram, and lab testing.  BMP remained stable, BNP was elevated.  Echocardiogram results are pending.  Also ordered chest x-ray to evaluate for underlying pneumonitis as a reaction to amiodarone.  Chest x-ray did reveal nodular opacity in the right lower  lung and right infrahilar airspace opacity.  Results of chest x-ray have been sent to patient's PCP for further evaluation and management.  Patient now presents for follow-up.  Patient's symptoms have essentially resolved and he is feeling quite well overall.  Patient walked in the hallway and despite continued marked bradycardia is heart rate increased to 74 bpm demonstrating chronotropic competency.  There is no junctional escape on EKG today.  Suspect patient's heart rate may continue to improve as amiodarone washes out of his system.  Been elevated BNP advised patient to continue Lasix and potassium for an additional 5 days.  We will repeat BMP and BNP following this.  Results of echocardiogram are pending, will call patient with these.  Follow-up in 3 months, sooner if needed.   Alethia Berthold, PA-C 02/02/2022, 11:33 AM Office: [phone]

## 2022-02-01 ENCOUNTER — Other Ambulatory Visit: Payer: Self-pay | Admitting: Cardiology

## 2022-02-01 DIAGNOSIS — I4891 Unspecified atrial fibrillation: Secondary | ICD-10-CM

## 2022-02-01 NOTE — Progress Notes (Signed)
Done it has been send

## 2022-02-01 NOTE — Progress Notes (Signed)
Please forward results to patient's PCP

## 2022-02-02 ENCOUNTER — Ambulatory Visit: Payer: 59 | Admitting: Student

## 2022-02-02 ENCOUNTER — Encounter: Payer: Self-pay | Admitting: Student

## 2022-02-02 VITALS — BP 134/62 | HR 46 | Temp 98.0°F | Resp 16 | Ht 70.0 in | Wt 191.0 lb

## 2022-02-02 DIAGNOSIS — I4819 Other persistent atrial fibrillation: Secondary | ICD-10-CM

## 2022-02-02 DIAGNOSIS — R0609 Other forms of dyspnea: Secondary | ICD-10-CM

## 2022-02-15 ENCOUNTER — Ambulatory Visit (INDEPENDENT_AMBULATORY_CARE_PROVIDER_SITE_OTHER): Payer: 59 | Admitting: Orthopedic Surgery

## 2022-02-15 ENCOUNTER — Ambulatory Visit (INDEPENDENT_AMBULATORY_CARE_PROVIDER_SITE_OTHER): Payer: 59

## 2022-02-15 ENCOUNTER — Encounter: Payer: Self-pay | Admitting: Orthopedic Surgery

## 2022-02-15 DIAGNOSIS — M79605 Pain in left leg: Secondary | ICD-10-CM

## 2022-02-15 DIAGNOSIS — S31502D Unspecified open wound of unspecified external genital organs, female, subsequent encounter: Secondary | ICD-10-CM | POA: Diagnosis not present

## 2022-02-15 DIAGNOSIS — S32502D Unspecified fracture of left pubis, subsequent encounter for fracture with routine healing: Secondary | ICD-10-CM | POA: Diagnosis not present

## 2022-02-15 NOTE — Progress Notes (Signed)
Post-Op Visit Note   Patient: Keith Fisher           Date of Birth: 1945-10-11           MRN: [id number] Visit Date: 02/15/2022 PCP: Farris Has, MD   Assessment & Plan:  Chief Complaint:  Chief Complaint  Patient presents with   Left Leg - Pain, Follow-up   Visit Diagnoses:  1. Left leg pain     Plan: Patient presents for follow-up of left pubic rami fracture sustained 01/16/2022.  He is doing well.  No assistive devices.  Is actually been out hitting some tennis balls.  On examination he has excellent range of motion of the left hip including 5 out of 5 abduction adduction and hip flexor strength.  No groin pain on the left with internal and external rotation of the leg.  Radiographs showed healed rami fractures.  Plan is activity as tolerated and follow-up as needed.  Right knee still has some arthritis and valgus alignment but not too much effusion today and patient wants to live with that as long as he can.  Follow-Up Instructions: Return if symptoms worsen or fail to improve.   Orders:  Orders Placed This Encounter  Procedures   XR HIP UNILAT W OR W/O PELVIS 2-3 VIEWS LEFT   No orders of the defined types were placed in this encounter.   Imaging: XR HIP UNILAT W OR W/O PELVIS 2-3 VIEWS LEFT  Result Date: 02/15/2022 AP pelvis lateral left hip reviewed.  No arthritis or fracture in the left femoral neck or proximal femoral region.  Rami fractures are healing with good callus formation and no displacement.   PMFS History: Patient Active Problem List   Diagnosis Date Noted   Typical atrial flutter (HCC)    Tricuspid regurgitation 11/22/2021   Persistent atrial fibrillation (HCC) 11/22/2021   Myelopathy (HCC) 05/14/2019   Dyspnea on exertion 03/05/2017   Mitral regurgitation 03/05/2017   Past Medical History:  Diagnosis Date   Arthritis    Dyspnea    Lymphatic filariasis    Mitral valve regurgitation    Pulmonary hypertension (HCC)    Tricuspid regurgitation      History reviewed. No pertinent family history.  Past Surgical History:  Procedure Laterality Date   BUBBLE STUDY  11/22/2021   Procedure: BUBBLE STUDY;  Surgeon: Yates Decamp, MD;  Location: [facility] ENDOSCOPY;  Service: Cardiovascular;;   CARDIOVERSION N/A 11/22/2021   Procedure: CARDIOVERSION;  Surgeon: Yates Decamp, MD;  Location: [facility] ENDOSCOPY;  Service: Cardiovascular;  Laterality: N/A;   CARDIOVERSION N/A 12/27/2021   Procedure: CARDIOVERSION;  Surgeon: Yates Decamp, MD;  Location: [facility] ENDOSCOPY;  Service: Cardiovascular;  Laterality: N/A;   CARDIOVERSION N/A 01/25/2022   Procedure: CARDIOVERSION;  Surgeon: [name], MD;  Location: [facility] ENDOSCOPY;  Service: Cardiovascular;  Laterality: N/A;   INGUINAL HERNIA REPAIR Right 06/13/2021   Procedure: LAPAROSCOPIC RIGHT INGUINAL HERNIA REPAIR WITH MESH;  Surgeon: Axel Filler, MD;  Location: [facility] OR;  Service: General;  Laterality: Right;   INSERTION OF MESH Right 06/13/2021   Procedure: INSERTION OF MESH;  Surgeon: Axel Filler, MD;  Location: [facility] OR;  Service: General;  Laterality: Right;   POSTERIOR CERVICAL FUSION/FORAMINOTOMY N/A 05/14/2019   Procedure: Posterior Cervical Fusion and decompression with lateral mass fixation - Cervical Three-Four,Cervical Four-Five, Cervical Five-Six, Cervical Six-Seven.;  Surgeon: Donalee Citrin, MD;  Location: [facility] OR;  Service: Neurosurgery;  Laterality: N/A;  posterior   RIGHT/LEFT HEART CATH AND CORONARY ANGIOGRAPHY N/A 11/22/2021  Procedure: RIGHT/LEFT HEART CATH AND CORONARY ANGIOGRAPHY;  Surgeon: Yates Decamp, MD;  Location: [facility] INVASIVE CV LAB;  Service: Cardiovascular;  Laterality: N/A;   SHOULDER SURGERY     TEE WITHOUT CARDIOVERSION N/A 03/06/2017   Procedure: TRANSESOPHAGEAL ECHOCARDIOGRAM (TEE);  Surgeon: Yates Decamp, MD;  Location: [facility] ENDOSCOPY;  Service: Cardiovascular;  Laterality: N/A;   TEE WITHOUT CARDIOVERSION N/A 11/22/2021   Procedure: TRANSESOPHAGEAL ECHOCARDIOGRAM (TEE);  Surgeon: Yates Decamp, MD;   Location: [facility] ENDOSCOPY;  Service: Cardiovascular;  Laterality: N/A;   Social History   Occupational History   Not on file  Tobacco Use   Smoking status: Never   Smokeless tobacco: Never  Vaping Use   Vaping Use: Never used  Substance and Sexual Activity   Alcohol use: No   Drug use: No   Sexual activity: Not on file

## 2022-02-16 ENCOUNTER — Ambulatory Visit: Payer: 59 | Admitting: Cardiology

## 2022-03-14 ENCOUNTER — Other Ambulatory Visit: Payer: Self-pay | Admitting: Cardiology

## 2022-03-14 DIAGNOSIS — I4891 Unspecified atrial fibrillation: Secondary | ICD-10-CM

## 2022-05-04 ENCOUNTER — Ambulatory Visit: Payer: 59 | Admitting: Cardiology

## 2022-05-11 ENCOUNTER — Ambulatory Visit: Payer: Commercial Managed Care - HMO | Admitting: Orthopedic Surgery

## 2022-05-11 ENCOUNTER — Encounter: Payer: Self-pay | Admitting: Orthopedic Surgery

## 2022-05-11 DIAGNOSIS — M25462 Effusion, left knee: Secondary | ICD-10-CM | POA: Diagnosis not present

## 2022-05-11 MED ORDER — LIDOCAINE HCL 1 % IJ SOLN
5.0000 mL | INTRAMUSCULAR | Status: AC | PRN
  Administered 2022-05-11: 5 mL

## 2022-05-11 MED ORDER — METHYLPREDNISOLONE ACETATE 40 MG/ML IJ SUSP
40.0000 mg | INTRAMUSCULAR | Status: AC | PRN
  Administered 2022-05-11: 40 mg via INTRA_ARTICULAR

## 2022-05-11 MED ORDER — BUPIVACAINE HCL 0.25 % IJ SOLN
4.0000 mL | INTRAMUSCULAR | Status: AC | PRN
  Administered 2022-05-11: 4 mL via INTRA_ARTICULAR

## 2022-05-11 NOTE — Progress Notes (Signed)
Office Visit Note   Patient: Keith Fisher           Date of Birth: May 09, 1946           MRN: [id number] Visit Date: 05/11/2022 Requested by: Farris Has, MD [address] [place],  [place] [address] PCP: Farris Has, MD  Subjective: Chief Complaint  Patient presents with   Left Knee - Pain    HPI: Is a 76 year old patient with left knee pain.  Over a week ago he went to an urgent care and had the knee aspirated from "multiple locations around the knee".  Records of that encounter are not available.  He denies any fevers and chills but reports recurrent swelling and significant pain.  He does have known left knee arthritis.  Denies any other systemic complaints.              ROS: All systems reviewed are negative as they relate to the chief complaint within the history of present illness.  Patient denies  fevers or chills.   Assessment & Plan: Visit Diagnoses:  1. Effusion, left knee     Plan: Impression is left knee effusion which is cloudy.  Aspiration today of about 80 cc.  Injected cortisone.  Sent the fluid for Gram stain cell count aerobic and aerobic culture as well as crystal analysis.  This looks like gout or pseudogout.  We will see what the analysis shows.  I will let him know what that shows.  The amount of fluid in the knee is more consistent with either gout process or infectious process.  Follow-Up Instructions: Return if symptoms worsen or fail to improve.   Orders:  Orders Placed This Encounter  Procedures   Anaerobic and Aerobic Culture   Synovial fluid, crystal   Synovial Fluid Analysis, Complete   No orders of the defined types were placed in this encounter.     Procedures: Large Joint Inj: L knee on 05/11/2022 12:16 PM Indications: diagnostic evaluation, joint swelling and pain Details: 18 G 1.5 in needle, superolateral approach  Arthrogram: No  Medications: 5 mL lidocaine 1 %; 40 mg methylPREDNISolone acetate 40 MG/ML; 4 mL  bupivacaine 0.25 % Aspirate: 80 mL cloudy; sent for lab analysis Outcome: tolerated well, no immediate complications Procedure, treatment alternatives, risks and benefits explained, specific risks discussed. Consent was given by the patient. Immediately prior to procedure a time out was called to verify the correct patient, procedure, equipment, support staff and site/side marked as required. Patient was prepped and draped in the usual sterile fashion.       Clinical Data: No additional findings.  Objective: Vital Signs: There were no vitals taken for this visit.  Physical Exam:   Constitutional: Patient appears well-developed HEENT:  Head: Normocephalic Eyes:EOM are normal Neck: Normal range of motion Cardiovascular: Normal rate Pulmonary/chest: Effort normal Neurologic: Patient is alert Skin: Skin is warm Psychiatric: Patient has normal mood and affect   Ortho Exam: Ortho exam demonstrates tenderness to palpation around the left knee.  No proximal lymphadenopathy.  Ankle dorsiflexion intact.  Moderate to large effusion is present in the left knee.  Extensor mechanism is intact.  No abrasions skin changes masses or lymphadenopathy around the left knee region.  Flexion is limited due to the effusion.  Flexion is approximately 30 to 40 degrees.  Specialty Comments:  No specialty comments available.  Imaging: No results found.   PMFS History: Patient Active Problem List   Diagnosis Date Noted  Typical atrial flutter (HCC)    Tricuspid regurgitation 11/22/2021   Persistent atrial fibrillation (HCC) 11/22/2021   Myelopathy (HCC) 05/14/2019   Dyspnea on exertion 03/05/2017   Mitral regurgitation 03/05/2017   Past Medical History:  Diagnosis Date   Arthritis    Dyspnea    Lymphatic filariasis    Mitral valve regurgitation    Pulmonary hypertension (HCC)    Tricuspid regurgitation     History reviewed. No pertinent family history.  Past Surgical History:   Procedure Laterality Date   BUBBLE STUDY  11/22/2021   Procedure: BUBBLE STUDY;  Surgeon: Yates Decamp, MD;  Location: [facility] ENDOSCOPY;  Service: Cardiovascular;;   CARDIOVERSION N/A 11/22/2021   Procedure: CARDIOVERSION;  Surgeon: Yates Decamp, MD;  Location: [facility] ENDOSCOPY;  Service: Cardiovascular;  Laterality: N/A;   CARDIOVERSION N/A 12/27/2021   Procedure: CARDIOVERSION;  Surgeon: Yates Decamp, MD;  Location: [facility] ENDOSCOPY;  Service: Cardiovascular;  Laterality: N/A;   CARDIOVERSION N/A 01/25/2022   Procedure: CARDIOVERSION;  Surgeon: [name], MD;  Location: [facility] ENDOSCOPY;  Service: Cardiovascular;  Laterality: N/A;   INGUINAL HERNIA REPAIR Right 06/13/2021   Procedure: LAPAROSCOPIC RIGHT INGUINAL HERNIA REPAIR WITH MESH;  Surgeon: Axel Filler, MD;  Location: [facility] OR;  Service: General;  Laterality: Right;   INSERTION OF MESH Right 06/13/2021   Procedure: INSERTION OF MESH;  Surgeon: Axel Filler, MD;  Location: [facility] OR;  Service: General;  Laterality: Right;   POSTERIOR CERVICAL FUSION/FORAMINOTOMY N/A 05/14/2019   Procedure: Posterior Cervical Fusion and decompression with lateral mass fixation - Cervical Three-Four,Cervical Four-Five, Cervical Five-Six, Cervical Six-Seven.;  Surgeon: Donalee Citrin, MD;  Location: [facility] OR;  Service: Neurosurgery;  Laterality: N/A;  posterior   RIGHT/LEFT HEART CATH AND CORONARY ANGIOGRAPHY N/A 11/22/2021   Procedure: RIGHT/LEFT HEART CATH AND CORONARY ANGIOGRAPHY;  Surgeon: Yates Decamp, MD;  Location: [facility] INVASIVE CV LAB;  Service: Cardiovascular;  Laterality: N/A;   SHOULDER SURGERY     TEE WITHOUT CARDIOVERSION N/A 03/06/2017   Procedure: TRANSESOPHAGEAL ECHOCARDIOGRAM (TEE);  Surgeon: Yates Decamp, MD;  Location: [facility] ENDOSCOPY;  Service: Cardiovascular;  Laterality: N/A;   TEE WITHOUT CARDIOVERSION N/A 11/22/2021   Procedure: TRANSESOPHAGEAL ECHOCARDIOGRAM (TEE);  Surgeon: Yates Decamp, MD;  Location: [facility] ENDOSCOPY;  Service: Cardiovascular;  Laterality: N/A;    Social History   Occupational History   Not on file  Tobacco Use   Smoking status: Never   Smokeless tobacco: Never  Vaping Use   Vaping Use: Never used  Substance and Sexual Activity   Alcohol use: No   Drug use: No   Sexual activity: Not on file

## 2022-05-17 LAB — SYNOVIAL FLUID ANALYSIS, COMPLETE
Basophils, %: 0 %
Eosinophils-Synovial: 0 % (ref 0–2)
Lymphocytes-Synovial Fld: 18 % (ref 0–74)
Monocyte/Macrophage: 12 % (ref 0–69)
Neutrophil, Synovial: 70 % — ABNORMAL HIGH (ref 0–24)
Synoviocytes, %: 0 % (ref 0–15)
WBC, Synovial: 12470 cells/uL — ABNORMAL HIGH (ref <150)

## 2022-05-17 LAB — ANAEROBIC AND AEROBIC CULTURE
AER RESULT:: NO GROWTH
MICRO NUMBER:: 13858561
MICRO NUMBER:: 13858562
SPECIMEN QUALITY:: ADEQUATE
SPECIMEN QUALITY:: ADEQUATE

## 2022-05-25 ENCOUNTER — Ambulatory Visit: Payer: Commercial Managed Care - HMO | Admitting: Cardiology

## 2022-05-25 ENCOUNTER — Encounter: Payer: Self-pay | Admitting: Cardiology

## 2022-05-25 VITALS — BP 142/74 | HR 79 | Temp 97.9°F | Resp 16 | Ht 70.0 in | Wt 179.0 lb

## 2022-05-25 DIAGNOSIS — I071 Rheumatic tricuspid insufficiency: Secondary | ICD-10-CM

## 2022-05-25 DIAGNOSIS — M792 Neuralgia and neuritis, unspecified: Secondary | ICD-10-CM

## 2022-05-25 DIAGNOSIS — I5032 Chronic diastolic (congestive) heart failure: Secondary | ICD-10-CM

## 2022-05-25 DIAGNOSIS — I480 Paroxysmal atrial fibrillation: Secondary | ICD-10-CM

## 2022-05-25 MED ORDER — XARELTO 20 MG PO TABS: ORAL_TABLET | ORAL | 3 refills | Status: DC

## 2022-05-25 NOTE — Progress Notes (Signed)
Primary Physician/Referring:  London Pepper, MD  Patient ID: Keith Fisher, male    DOB: 08-Aug-1946, 76 y.o.   MRN: [id number]  Chief Complaint  Patient presents with   Atrial Fibrillation   Follow-up    3 months    HPI:    Keith Fisher  is a 76 y.o. with history of filaria since and has chronic lymphedema involving his right leg, mild chronic dyspnea, mitral valve prolapse with moderate to severe mitral regurgitation moderate to severe tricuspid regurgitation, fairly active and chronic bradycardia asymptomatic by EKG, he had new onset of atrial fibrillation on 10/12/2021 and also had worsening of valvular heart disease and acute diastolic heart failure.   He has had multiple direct-current cardioversion, on 11/22/2021, 12/26/2021 and on 01/25/2022.  He was started on amiodarone but developed marked junctional bradycardia and symptomatic dyspnea.  This was discontinued.  He now presents for follow-up.  He is feeling the best he has in quite a while and dyspnea is very minimal.  He continues to use furosemide on a daily basis.   Denies chest discomfort. No further left leg edema.  Past Medical History:  Diagnosis Date   Arthritis    Dyspnea    Lymphatic filariasis    Mitral valve regurgitation    Pulmonary hypertension (HCC)    Tricuspid regurgitation     History reviewed. No pertinent family history.  Social History   Tobacco Use   Smoking status: Never   Smokeless tobacco: Never  Substance Use Topics   Alcohol use: No   Marital Status: Married  ROS  Review of Systems  Cardiovascular:  Positive for leg swelling (Right leg lymphedema chronic). Negative for chest pain and dyspnea on exertion.   Objective  Blood pressure (!) 142/74, pulse 79, temperature 97.9 F (36.6 C), temperature source Temporal, resp. rate 16, height _0  (1.778 m), weight 179 lb (81.2 kg), SpO2 100 %. Body mass index is 25.68 kg/m.     05/25/2022   10:49 AM 02/02/2022    9:56 AM 01/26/2022    2:37 PM   Vitals with BMI  Height _1  _2  _3   Weight 179 lbs 191 lbs 195 lbs 3 oz  BMI 25.68 62.37 62.83  Systolic 151 761 607  Diastolic 74 62 64  Pulse 79 46 50    Orthostatic VS for the past 72 hrs (Last 3 readings):  Patient Position BP Location Cuff Size  05/25/22 1049 Sitting Left Arm Normal  Resting heart rate 39 bpm Heart rate after 1 minute of walking 74 bpm  Physical Exam Vitals reviewed.  Neck:     Vascular: No carotid bruit or JVD.  Cardiovascular:     Rate and Rhythm: Normal rate and regular rhythm. Occasional Extrasystoles are present.    Pulses: Normal pulses and intact distal pulses.     Heart sounds: No murmur heard.    No gallop. No S3 or S4 sounds.  Pulmonary:     Effort: Pulmonary effort is normal.     Breath sounds: No rales.     Comments: Dyspneic while speaking on exam  Musculoskeletal:     Right lower leg: Edema (Lymphedema chronic) present.     Left lower leg: Edema (trace) present.    Allergies   Allergies  Allergen Reactions   Digoxin And Related Anxiety    A combination of metoprolol and digoxin caused anxiety   Metoprolol Anxiety    A combination of metoprolol and digoxin caused anxiety  Final Medications at End of Visit    Current Outpatient Medications:    acetaminophen (TYLENOL) 500 MG tablet, Take 500 mg by mouth every 6 (six) hours as needed for moderate pain., Disp: , Rfl:    acetaminophen-codeine (TYLENOL #3) 300-30 MG tablet, Take 1 tablet by mouth every 8 (eight) hours as needed for moderate pain., Disp: 30 tablet, Rfl: 0   furosemide (LASIX) 40 MG tablet, Take 1 tablet (40 mg total) by mouth daily as needed for fluid or edema. (Patient taking differently: Take 40 mg by mouth daily.), Disp: 90 tablet, Rfl: 3   gabapentin (NEURONTIN) 600 MG tablet, Take 600 mg by mouth 3 (three) times daily as needed., Disp: , Rfl:    potassium chloride SA (KLOR-CON M20) 20 MEQ tablet, Take 2 tablets (40 mEq total) by mouth daily as needed  (with Lasix). (Patient taking differently: Take 20 mEq by mouth daily.), Disp: 90 tablet, Rfl: 3   traMADol (ULTRAM) 50 MG tablet, Take 1 tablet (50 mg total) by mouth every 6 (six) hours as needed., Disp: 60 tablet, Rfl: 0   XARELTO 20 MG TABS tablet, TAKE 1 TABLET(20 MG) BY MOUTH DAILY WITH SUPPER, Disp: 90 tablet, Rfl: 3    Laboratory examination:   Lab Results  Component Value Date   NA 138 01/30/2022   K 4.2 01/30/2022   CO2 20 01/30/2022   GLUCOSE 127 (H) 01/30/2022   BUN 18 01/30/2022   CREATININE 1.33 (H) 01/30/2022   CALCIUM 8.7 01/30/2022   EGFR 56 (L) 01/30/2022   GFRNONAA >60 06/13/2021    Recent Labs    06/13/21 0839 06/13/21 0839 11/17/21 1321 11/22/21 1429 12/22/21 0830 01/25/22 1102 01/30/22 0826  NA 136   < > 138   < > 135 138 138  K 4.0  --  4.6   < > 4.6 4.3 4.2  CL 105  --  104  --  101 102 103  CO2 23  --  22  --  25  --  20  GLUCOSE 100*   < > 131*  --  102* 115* 127*  BUN 10   < > 19  --  _0 CREATININE 1.14  --  1.28*  --  1.39* 1.50* 1.33*  CALCIUM 9.1  --  9.4  --  9.6  --  8.7  GFRNONAA >60  --   --   --   --   --   --    < > = values in this interval not displayed.   CrCl cannot be calculated (Patient's most recent lab result is older than the maximum 21 days allowed.).     Latest Ref Rng & Units 01/30/2022    8:26 AM 01/25/2022   11:02 AM 12/22/2021    8:30 AM  CMP  Glucose 70 - 99 mg/dL 127  115  102   BUN 8 - 27 mg/dL _1 Creatinine 0.76 - 1.27 mg/dL 1.33  1.50  1.39   Sodium 134 - 144 mmol/L 138  138  135   Potassium 3.5 - 5.2 mmol/L 4.2  4.3  4.6   Chloride 96 - 106 mmol/L 103  102  101   CO2 20 - 29 mmol/L 20   25   Calcium 8.6 - 10.2 mg/dL 8.7   9.6       Latest Ref Rng & Units 01/25/2022   11:02 AM 12/22/2021    8:30 AM 11/22/2021  2:33 PM  CBC  WBC 3.4 - 10.8 x10E3/uL  6.7    Hemoglobin 13.0 - 17.0 g/dL 12.2  13.7  11.6   Hematocrit 39.0 - 52.0 % 36.0  40.1  34.0   Platelets 150 - 450 x10E3/uL  228      External labs:   09/27/2021: A1c 6.3%.  TSH 1.40, normal. Hb 13.2/HCT 31.3, platelets 240, normal indicis. Serum glucose 99 mg, BUN 16, creatinine 1.14, potassium 4.9, LFTs normal, CMP otherwise normal.  Lipid profile from 2016: Total cholesterol 140, triglycerides 93, HDL 54, non-HDL cholesterol 87.  Radiology:  No results found.  Chest x-ray 01/27/2022: 1. A 9 mm nodular opacity in the right lower lobe. Recommend further evaluation with a CT of the chest for better characterization. 2. Right infrahilar airspace opacity which may reflect atelectasis versus pneumonia.   Cardiac Studies:   Nuclear stress test   [02/09/2017]:  1. The resting electrocardiogram demonstrated normal sinus rhythm, normal resting conduction, no resting arrhythmias and normal rest repolarization. Stress EKG is non-diagnostic for ischemia as it a pharmacologic stress using Lexiscan. Stress symptoms included dyspnea. 2. Myocardial perfusion imaging is normal. Overall left ventricular systolic function was normal without regional wall motion abnormalities. The left ventricular ejection fraction was 75%.   TEE: Under MAC anesthesis, TEE was performed without complications 99/83/3825:: LV: Normal size. Low normal  EF. RV: Mildly dilated, normal RV function LA: Severely enlarged. Left atrial appendage: Normal without thrombus. Normal function. Inter atrial septum is intact without defect. Double contrast study negative for atrial level shunting. No late appearance of bubbles either. RA: Severely dilated   MV: Mild MVP. Moderate central MR without reversal of flow in the pulmonary veins TV: Mild annular dilatation. Prolapse of the lateral leaflet with moderate to severe TR. Mild pulmonary hypertension (RVSP 35 mm Hg) AV: Normal. No AI or AS. PV: Normal. Trace PI.   Pulmonary artery: Normal sized.  Thoracic and ascending aorta: Normal without significant plaque or atheromatous changes.  PCV ECHOCARDIOGRAM  COMPLETE 10/27/2021 1. Normal LV systolic function with EF 57%. Left ventricle cavity is normal in size. Normal global wall motion. Indeterminate diastolic filling pattern, elevated LAP. Calculated EF 57%. 2. Left atrial cavity is moderately dilated. Right atrial cavity is severely dilated. 3. Right ventricle cavity is severely dilated. Normal right ventricular function. Mild mitral valve leaflet thickening. Mild prolapse of the mitral valve leaflets. I cannot exclude flail leaflet. Severe (Grade IV) mitral regurgitation. 4. Structurally normal tricuspid valve.  Annular dilatation. Severe tricuspid regurgitation.  Mild pulmonary hypertension. RVSP measures 38 mmHg. 5. IVC is dilated with poor inspiration collapse consistent with elevated right atrial pressure. Compared to the study done on 04/29/2019, atrial fibrillation is new.  Previously left atrium mildly dilated.  Mild prolapse with moderate MR. Previously RA moderately dilated.  TR was moderate to severe with moderate pulmonary hypertension.  Right and left heart catheterization 11/22/2021: Procedural data:  RA pressure 15/13 mean 11 mm mercury.  RV pressure 33/9 and Right ventricular EDP 12 mm Hg. PA pressure 30/17 with a mean of 19 mm mercury. PA saturation 59%.  Pulmonary capillary wedge 16/15 with a mean of 11 mm Hg. Aortic saturation 100%.  Cardiac output was 3.7 with cardiac index of 1.82 by Fick.  QP/QS 1.00   LV: 96/51, EDP 13 mmHg.  Ao 91/72, mean 75 mmHg.  No pressure gradient across the aortic valve.  LVEF 45 to 50% with mild global hypokinesis. Coronary angiogram: Left dominant circulation.  Large left  main, large LAD gives origin to large D1, no significant disease.  Circumflex is very large, large OM1 and large PDA branches.  RI is small to moderate vessel, free of disease. RCA: Nondominant.  Normal.  Impression: Normal coronary arteries, left dominant circulation.  Low normal LVEF at 50%.  Mild global hypokinesis. Markedly  reduced cardiac output and cardiac index, probably related to frequent PVCs and PACs.  20 mL contrast utilized.   PCV ECHOCARDIOGRAM COMPLETE 01/31/2022  Narrative Echocardiogram 01/31/2022: Normal LV systolic function with visual EF 55-60%. Left ventricle cavity is normal in size. Normal left ventricular wall thickness. Normal global wall motion. Doppler evidence of grade III (restrictive) diastolic dysfunction, elevated LAP. Visually, right atrial cavity is moderate to severely dilated. Visually, right ventricle cavity is severely dilated in size. Moderate (Grade III) mitral regurgitation. Moderate central MR jet color flow area. Native tricuspid valve.  No evidence of tricuspid stenosis.  Bileaflet prolapse of the tricuspid valve. Severe tricuspid regurgitation. Mild pulmonary hypertension. RVSP measures 41 mmHg. Moderate pulmonic regurgitation. Compared to 10/27/2021 LAE is now normal, G3DD & increased LAP are new findings, otherwise no significant change.   EKG:    EKG 05/25/2022: Sinus rhythm with marked first-degree AV block at rate of 61 bpm, PR interval 440 ms.  Normal axis.  Nonspecific ST changes.  Normal QT interval.  No significant change from 02/02/2022, previously heart rate was 49 bpm.  EKG 01/12/2022: Typical atrial flutter with 3: 1 conduction, ventricular rate 118 bpm, normal axis, incomplete right bundle branch block.  Poor R wave progression, nonspecific T abnormality.  No significant change from 12/08/2021.  EKG 12/08/2021: Atypical atrial flutter with variable AV conduction at rate of 92 bpm.  Poor R wave progression.  Nonspecific T abnormality.  EKG 10/12/2021: Atrial fibrillation with rapid ventricular sponsor at rate of 118 bpm, low voltage complexes, nonspecific T abnormality.    Assessment     ICD-10-CM   1. Paroxysmal atrial fibrillation (HCC)  I48.0 EKG 12-Lead    PCV ECHOCARDIOGRAM COMPLETE    XARELTO 20 MG TABS tablet    2. Chronic diastolic heart failure (HCC)   I50.32     3. Severe tricuspid regurgitation  I07.1 PCV ECHOCARDIOGRAM COMPLETE    4. Neuropathic pain  M79.2        Medications Discontinued During This Encounter  Medication Reason   gabapentin (NEURONTIN) 400 MG capsule Dose change   amiodarone (PACERONE) 200 MG tablet Discontinued by provider   XARELTO 20 MG TABS tablet Reorder      Meds ordered this encounter  Medications   XARELTO 20 MG TABS tablet    Sig: TAKE 1 TABLET(20 MG) BY MOUTH DAILY WITH SUPPER    Dispense:  90 tablet    Refill:  3   Orders Placed This Encounter  Procedures   EKG 12-Lead   PCV ECHOCARDIOGRAM COMPLETE    Standing Status:   Future    Standing Expiration Date:   05/26/2023    Recommendations:   Keith Fisher is a 76 y.o. Asian [4] male with history of filaria since and has chronic lymphedema involving his right leg, mild chronic dyspnea, mitral valve prolapse with moderate to severe mitral regurgitation moderate to severe tricuspid regurgitation, fairly active and chronic bradycardia asymptomatic by EKG, he had new onset of atrial fibrillation on 10/12/2021 and also had worsening of valvular heart disease and acute diastolic heart failure.   He has had multiple direct-current cardioversion, on 11/22/2021, 12/26/2021 and on 01/25/2022.  He  was started on amiodarone but developed marked junctional bradycardia and symptomatic dyspnea.  This was discontinued.  He now presents for follow-up.  He is feeling the best he has in quite a while and dyspnea is very minimal.  He continues to use furosemide on a daily basis.  He is not on any other medications except Xarelto for atrial fibrillation.  He does not like to be on any medications per se.  He has also discontinued taking gabapentin on regular basis and he does have severe neuropathic pain from spinal stenosis but he is still been able to tolerate the pain and he has started to play tennis.  Today the examination reveals much improved MR and TR murmur, I will  repeat echocardiogram in 3 months and I will like to see him back at that time.  He is not on guideline directed medical therapy as beta-blockers cause marked bradycardia and not on ACE inhibitor due to worsening renal function, however I may want to re-challenge him maybe on his next OV.Adrian Prows, PA-C 05/25/2022, 11:48 AM Office: [phone]

## 2022-05-26 ENCOUNTER — Other Ambulatory Visit: Payer: Self-pay

## 2022-05-26 DIAGNOSIS — I480 Paroxysmal atrial fibrillation: Secondary | ICD-10-CM

## 2022-05-26 MED ORDER — XARELTO 20 MG PO TABS: ORAL_TABLET | ORAL | 3 refills | Status: AC

## 2022-08-17 ENCOUNTER — Other Ambulatory Visit: Payer: Commercial Managed Care - HMO

## 2022-08-24 ENCOUNTER — Ambulatory Visit: Payer: Commercial Managed Care - HMO | Admitting: Cardiology

## 2022-09-28 DIAGNOSIS — R399 Unspecified symptoms and signs involving the genitourinary system: Secondary | ICD-10-CM | POA: Diagnosis not present

## 2022-09-28 DIAGNOSIS — N481 Balanitis: Secondary | ICD-10-CM | POA: Diagnosis not present

## 2022-10-03 DIAGNOSIS — Z23 Encounter for immunization: Secondary | ICD-10-CM | POA: Diagnosis not present

## 2022-10-03 DIAGNOSIS — R030 Elevated blood-pressure reading, without diagnosis of hypertension: Secondary | ICD-10-CM | POA: Diagnosis not present

## 2023-01-26 DIAGNOSIS — N4829 Other inflammatory disorders of penis: Secondary | ICD-10-CM | POA: Diagnosis not present

## 2023-02-08 DIAGNOSIS — G959 Disease of spinal cord, unspecified: Secondary | ICD-10-CM | POA: Diagnosis not present

## 2023-02-08 DIAGNOSIS — E538 Deficiency of other specified B group vitamins: Secondary | ICD-10-CM | POA: Diagnosis not present

## 2023-02-08 DIAGNOSIS — I4891 Unspecified atrial fibrillation: Secondary | ICD-10-CM | POA: Diagnosis not present

## 2023-02-08 DIAGNOSIS — R202 Paresthesia of skin: Secondary | ICD-10-CM | POA: Diagnosis not present

## 2023-02-08 DIAGNOSIS — M4712 Other spondylosis with myelopathy, cervical region: Secondary | ICD-10-CM | POA: Diagnosis not present

## 2023-02-12 ENCOUNTER — Encounter: Payer: Self-pay | Admitting: Cardiology

## 2023-02-12 ENCOUNTER — Ambulatory Visit: Payer: 59 | Admitting: Cardiology

## 2023-02-12 VITALS — BP 142/83 | HR 71 | Resp 16 | Ht 70.0 in | Wt 184.0 lb

## 2023-02-12 DIAGNOSIS — I071 Rheumatic tricuspid insufficiency: Secondary | ICD-10-CM | POA: Diagnosis not present

## 2023-02-12 DIAGNOSIS — I5032 Chronic diastolic (congestive) heart failure: Secondary | ICD-10-CM

## 2023-02-12 DIAGNOSIS — M4803 Spinal stenosis, cervicothoracic region: Secondary | ICD-10-CM | POA: Diagnosis not present

## 2023-02-12 DIAGNOSIS — I340 Nonrheumatic mitral (valve) insufficiency: Secondary | ICD-10-CM | POA: Diagnosis not present

## 2023-02-12 DIAGNOSIS — I480 Paroxysmal atrial fibrillation: Secondary | ICD-10-CM

## 2023-02-12 NOTE — Progress Notes (Signed)
Primary Physician/Referring:  Farris Has, MD  Patient ID: Keith Fisher, male    DOB: 02-Mar-1946, 77 y.o.   MRN: [id number]  Chief Complaint  Patient presents with   Atrial Fibrillation   Follow-up    HPI:    Keith Fisher  is a 77 y.o. with history of filaria since and has chronic lymphedema involving his right leg, mild chronic dyspnea, mitral valve prolapse with moderate to severe mitral regurgitation moderate to severe tricuspid regurgitation, fairly active and chronic bradycardia asymptomatic by EKG, he had new onset of atrial fibrillation on 10/12/2021 and also had worsening of valvular heart disease and acute diastolic heart failure.  He has had multiple cardioversions, did not tolerate amiodarone due to marked bradycardia hence was discontinued.  He was evaluated by his PCP and made a referral for follow-up of atrial fibrillation.  He now presents for follow-up.  States that he is doing well, denies any dyspnea, his main issue has been back pain.  He continues to use furosemide on a daily basis.   Denies chest discomfort. No further left leg edema.  Past Medical History:  Diagnosis Date   Arthritis    Dyspnea    Lymphatic filariasis    Mitral valve regurgitation    Pulmonary hypertension (HCC)    Tricuspid regurgitation     History reviewed. No pertinent family history.  Social History   Tobacco Use   Smoking status: Never   Smokeless tobacco: Never  Substance Use Topics   Alcohol use: No   Marital Status: Married  ROS  Review of Systems  Cardiovascular:  Positive for leg swelling (Right leg lymphedema chronic). Negative for chest pain and dyspnea on exertion.   Objective  Blood pressure (!) 142/83, pulse 71, resp. rate 16, height 5\' 10"  (1.778 m), weight 184 lb (83.5 kg), SpO2 99 %. Body mass index is 26.4 kg/m.     02/12/2023   11:08 AM 05/25/2022   10:49 AM 02/02/2022    9:56 AM  Vitals with BMI  Height 5\' 10"  5\' 10"  5\' 10"   Weight 184 lbs 179 lbs 191 lbs  BMI  26.4 25.68 27.41  Systolic 142 142 409  Diastolic 83 74 62  Pulse 71 79 46    Orthostatic VS for the past 72 hrs (Last 3 readings):  Patient Position BP Location Cuff Size  02/12/23 1108 Sitting Left Arm Normal    Physical Exam Vitals reviewed.  Neck:     Vascular: No carotid bruit or JVD.  Cardiovascular:     Rate and Rhythm: Normal rate. Rhythm irregular.     Pulses: Normal pulses and intact distal pulses.     Heart sounds: No murmur heard.    No gallop. No S3 or S4 sounds.  Pulmonary:     Effort: Pulmonary effort is normal.     Breath sounds: No rales.     Comments: Dyspneic while speaking on exam  Musculoskeletal:     Right lower leg: Edema (Lymphedema chronic) present.     Left lower leg: Edema (trace) present.     Laboratory examination:   Lab Results  Component Value Date   NA 138 01/30/2022   K 4.2 01/30/2022   CO2 20 01/30/2022   GLUCOSE 127 (H) 01/30/2022   BUN 18 01/30/2022   CREATININE 1.33 (H) 01/30/2022   CALCIUM 8.7 01/30/2022   EGFR 56 (L) 01/30/2022   GFRNONAA >60 06/13/2021      Latest Ref Rng & Units 01/30/2022  8:26 AM 01/25/2022   11:02 AM 12/22/2021    8:30 AM  CMP  Glucose 70 - 99 mg/dL 409  811  914   BUN 8 - 27 mg/dL 18  18  16    Creatinine 0.76 - 1.27 mg/dL 7.82  9.56  2.13   Sodium 134 - 144 mmol/L 138  138  135   Potassium 3.5 - 5.2 mmol/L 4.2  4.3  4.6   Chloride 96 - 106 mmol/L 103  102  101   CO2 20 - 29 mmol/L 20   25   Calcium 8.6 - 10.2 mg/dL 8.7   9.6       Latest Ref Rng & Units 01/25/2022   11:02 AM 12/22/2021    8:30 AM 11/22/2021    2:33 PM  CBC  WBC 3.4 - 10.8 x10E3/uL  6.7    Hemoglobin 13.0 - 17.0 g/dL 08.6  57.8  46.9   Hematocrit 39.0 - 52.0 % 36.0  40.1  34.0   Platelets 150 - 450 x10E3/uL  228      External labs:   Labs 01/26/2023:  Hb 11.3/HCT 35.5, platelets 231, normal indicis.  Creatinine, Serum 1.270 mg/ 08/31/2022 CrCl Est 50.62 08/31/2022 eGFR 59.000 calc 08/31/2022  Potassium 4.200 mm  08/31/2022 Magnesium N/D ALT (SGPT) 14.000 U/L 08/31/2022  TSH 1.600 08/31/2022  09/27/2021: A1c 6.3%.  TSH 1.40, normal.  Radiology:  No results found.  Chest x-ray 01/27/2022: 1. A 9 mm nodular opacity in the right lower lobe. Recommend further evaluation with a CT of the chest for better characterization. 2. Right infrahilar airspace opacity which may reflect atelectasis versus pneumonia.   Cardiac Studies:   TEE: Under MAC anesthesis, TEE was performed without complications 11/22/2021:: LV: Normal size. Low normal  EF. RV: Mildly dilated, normal RV function LA: Severely enlarged. Left atrial appendage: Normal without thrombus. Normal function. Inter atrial septum is intact without defect. Double contrast study negative for atrial level shunting. No late appearance of bubbles either. RA: Severely dilated   MV: Mild MVP. Moderate central MR without reversal of flow in the pulmonary veins TV: Mild annular dilatation. Prolapse of the lateral leaflet with moderate to severe TR. Mild pulmonary hypertension (RVSP 35 mm Hg) AV: Normal. No AI or AS. PV: Normal. Trace PI.   Pulmonary artery: Normal sized.  Thoracic and ascending aorta: Normal without significant plaque or atheromatous changes.   Right and left heart catheterization 11/22/2021: Procedural data:  RA pressure 15/13 mean 11 mm mercury.  RV pressure 33/9 and Right ventricular EDP 12 mm Hg. PA pressure 30/17 with a mean of 19 mm mercury. PA saturation 59%.  Pulmonary capillary wedge 16/15 with a mean of 11 mm Hg. Aortic saturation 100%.  Cardiac output was 3.7 with cardiac index of 1.82 by Fick.  QP/QS 1.00    LV: 96/51, EDP 13 mmHg.  Ao 91/72, mean 75 mmHg.  No pressure gradient across the aortic valve.  LVEF 45 to 50% with mild global hypokinesis. Coronary angiogram: Left dominant circulation.  Large left main, large LAD gives origin to large D1, no significant disease.  Circumflex is very large, large OM1 and large  PDA branches.  RI is small to moderate vessel, free of disease. RCA: Nondominant.  Normal.  Impression: Normal coronary arteries, left dominant circulation.  Low normal LVEF at 50%.  Mild global hypokinesis. Markedly reduced cardiac output and cardiac index, probably related to frequent PVCs and PACs.  20 mL contrast utilized.   PCV ECHOCARDIOGRAM  COMPLETE 01/31/2022  Narrative Echocardiogram 01/31/2022: Normal LV systolic function with visual EF 55-60%. Left ventricle cavity is normal in size. Normal left ventricular wall thickness. Normal global wall motion. Doppler evidence of grade III (restrictive) diastolic dysfunction, elevated LAP. Visually, right atrial cavity is moderate to severely dilated. Visually, right ventricle cavity is severely dilated in size. Moderate (Grade III) mitral regurgitation. Moderate central MR jet color flow area. Native tricuspid valve.  No evidence of tricuspid stenosis.  Bileaflet prolapse of the tricuspid valve. Severe tricuspid regurgitation. Mild pulmonary hypertension. RVSP measures 41 mmHg. Moderate pulmonic regurgitation. Compared to 10/27/2021 LAE is now normal, G3DD & increased LAP are new findings, otherwise no significant change.   EKG:   EKG 02/12/2023: Atrial fibrillation with controlled ventricular response at the rate of 95 beats minute, normal axis.  Poor R progression, cannot exclude anteroseptal infarct old.  Low-voltage complexes.  Nonspecific T abnormality.  Compared to 05/25/2022, junctional escape rhythm at the rate of 61 bpm has been replaced.  However compared to 06/05/2022, marked sinus bradycardia has been replaced by atrial fibrillation.  EKG 10/12/2021: Atrial fibrillation with rapid ventricular sponsor at rate of 118 bpm, low voltage complexes, nonspecific T abnormality.    Allergies & Medications   Allergies  Allergen Reactions   Digoxin And Related Anxiety    A combination of metoprolol and digoxin caused anxiety   Metoprolol  Anxiety    A combination of metoprolol and digoxin caused anxiety    Current Outpatient Medications:    acetaminophen (TYLENOL) 500 MG tablet, Take 500 mg by mouth every 6 (six) hours as needed for moderate pain., Disp: , Rfl:    acetaminophen-codeine (TYLENOL #3) 300-30 MG tablet, Take 1 tablet by mouth every 8 (eight) hours as needed for moderate pain., Disp: 30 tablet, Rfl: 0   furosemide (LASIX) 40 MG tablet, Take 1 tablet (40 mg total) by mouth daily as needed for fluid or edema. (Patient taking differently: Take 40 mg by mouth daily.), Disp: 90 tablet, Rfl: 3   gabapentin (NEURONTIN) 600 MG tablet, Take 600 mg by mouth 3 (three) times daily as needed., Disp: , Rfl:    potassium chloride SA (KLOR-CON M20) 20 MEQ tablet, Take 2 tablets (40 mEq total) by mouth daily as needed (with Lasix). (Patient taking differently: Take 20 mEq by mouth daily.), Disp: 90 tablet, Rfl: 3   traMADol (ULTRAM) 50 MG tablet, Take 1 tablet (50 mg total) by mouth every 6 (six) hours as needed., Disp: 60 tablet, Rfl: 0   XARELTO 20 MG TABS tablet, TAKE 1 TABLET(20 MG) BY MOUTH DAILY WITH SUPPER, Disp: 90 tablet, Rfl: 3   Assessment     ICD-10-CM   1. Paroxysmal atrial fibrillation (HCC)  I48.0 EKG 12-Lead    2. Chronic diastolic heart failure (HCC)  W11.91     3. Moderate mitral regurgitation  I34.0     4. Severe tricuspid regurgitation  I07.1     5. Spinal stenosis of cervicothoracic region  M48.03 Ambulatory referral to Orthopedic Surgery     CHA2DS2-VASc Score is 2.  Yearly risk of stroke: 2.3% (, CHF).  Score of 1=0.6; 2=2.2; 3=3.2; 4=4.8; 5=7.2; 6=9.8; 7=>9.8) -(CHF; HTN; vasc disease DM,  Male = 1; Age <65 =0; 65-74 = 1,  >75 =2; stroke/embolism= 2).     There are no discontinued medications.   No orders of the defined types were placed in this encounter.  Orders Placed This Encounter  Procedures   Ambulatory referral to Orthopedic Surgery  Referral Priority:   Routine    Referral Type:    Surgical    Referral Reason:   Specialty Services Required    Referred to Provider:   Venita Lick, MD    Requested Specialty:   Orthopedic Surgery    Number of Visits Requested:   1   EKG 12-Lead    Recommendations:   Keith Fisher is a 77 y.o. Asian [4] male with history of filaria since and has chronic lymphedema involving his right leg, mild chronic dyspnea, mitral valve prolapse with moderate to severe mitral regurgitation moderate to severe tricuspid regurgitation, fairly active and chronic bradycardia asymptomatic by EKG, he had new onset of atrial fibrillation on 10/12/2021 and also had worsening of valvular heart disease and acute diastolic heart failure.  He has had multiple cardioversions, did not tolerate amiodarone due to marked bradycardia hence was discontinued.  He was evaluated by his PCP and made a referral for follow-up of atrial fibrillation.  1. Paroxysmal atrial fibrillation (HCC) Patient has paroxysmal atrial fibrillation, I suspect he probably will develop permanent atrial fibrillation especially in view of moderate to moderately severe mitral regurgitation and also moderate to severe tricuspid regurgitation as well.  He is presently on Xarelto for anticoagulation, although his CHA2DS2-VASc risk score is low, he had developed heart failure last year and also he is now 77 years of age soon-to-be 60 and his CHA2DS2-VASc rescore will certainly be at least 3.0.  He is tolerating anticoagulation without any bleeding diathesis, continue the same.  I suspect his atrial fibrillation is permanent.  As long as the heart rate is well-controlled and there is no clinical evidence of heart failure and he remains asymptomatic, doubt that he will maintain sinus rhythm without antiarrhythmic therapy and in view of underlying bradycardia, unable to use any antiarrhythmics as well.  Most importantly he remains asymptomatic without clinical evidence of acute decompensated heart failure or  dyspnea.  - EKG 12-Lead  2. Chronic diastolic heart failure (HCC) He has not had any further episodes of dyspnea, suspect he has gotten used to being in atrial fibrillation over time.  He needs repeat echocardiogram.  3. Moderate mitral regurgitation Needs follow-up echocardiogram, today there is no clinical evidence of heart failure.  4. Severe tricuspid regurgitation See above  5. Spinal stenosis of cervicothoracic region Patient is severely disabled but in spite of this continues to try to play tennis standing, still tries to walk and exercise, has severe neurogenic claudication, after discussions, he would like to have a second opinion, referral made to Dr. Venita Lick. I will see him back in 6 months for follow-up, will repeat echocardiogram prior to his next office visit. - Ambulatory referral to Orthopedic Surgery    Yates Decamp, MD, [facility] 02/12/2023, 12:11 PM Office: [phone] Fax: [phone] Pager: [phone]

## 2023-02-26 ENCOUNTER — Telehealth: Payer: Self-pay

## 2023-02-26 DIAGNOSIS — M1712 Unilateral primary osteoarthritis, left knee: Secondary | ICD-10-CM

## 2023-02-26 MED ORDER — TRAMADOL HCL 50 MG PO TABS: 50.0000 mg | ORAL_TABLET | Freq: Four times a day (QID) | ORAL | 2 refills | Status: AC | PRN

## 2023-02-26 NOTE — Telephone Encounter (Signed)
Patient needs Tramadol sent into pharmacy. He and wife said at last visit it was supposed to be sent in to pharmacy. [facility]. Do you approve this?

## 2023-02-26 NOTE — Telephone Encounter (Signed)
ICD-10-CM   1. Primary osteoarthritis of left knee  M17.12 traMADol (ULTRAM) 50 MG tablet     Meds ordered this encounter  Medications   traMADol (ULTRAM) 50 MG tablet    Sig: Take 1 tablet (50 mg total) by mouth every 6 (six) hours as needed.    Dispense:  60 tablet    Refill:  2

## 2023-03-08 ENCOUNTER — Other Ambulatory Visit: Payer: Self-pay

## 2023-03-08 ENCOUNTER — Ambulatory Visit: Payer: 59

## 2023-03-08 DIAGNOSIS — I428 Other cardiomyopathies: Secondary | ICD-10-CM

## 2023-03-08 DIAGNOSIS — I480 Paroxysmal atrial fibrillation: Secondary | ICD-10-CM | POA: Diagnosis not present

## 2023-03-08 DIAGNOSIS — I071 Rheumatic tricuspid insufficiency: Secondary | ICD-10-CM

## 2023-03-08 MED ORDER — FUROSEMIDE 40 MG PO TABS: 40.0000 mg | ORAL_TABLET | Freq: Every day | ORAL | 3 refills | Status: DC | PRN

## 2023-03-09 DIAGNOSIS — M5451 Vertebrogenic low back pain: Secondary | ICD-10-CM | POA: Diagnosis not present

## 2023-03-09 DIAGNOSIS — M1711 Unilateral primary osteoarthritis, right knee: Secondary | ICD-10-CM | POA: Diagnosis not present

## 2023-03-15 NOTE — Progress Notes (Addendum)
Any thoughts on TR treatment??     ICD-10-CM   1. Non-ischemic cardiomyopathy (HCC)  I42.8 PCV ECHOCARDIOGRAM COMPLETE    2. Paroxysmal atrial fibrillation (HCC)  I48.0 PCV ECHOCARDIOGRAM COMPLETE    PCV ECHOCARDIOGRAM COMPLETE    3. Severe tricuspid regurgitation  I07.1 PCV ECHOCARDIOGRAM COMPLETE    PCV ECHOCARDIOGRAM COMPLETE     Orders Placed This Encounter  Procedures   PCV ECHOCARDIOGRAM COMPLETE    Standing Status:   Future    Standing Expiration Date:   03/14/2024

## 2023-03-18 NOTE — Progress Notes (Signed)
Thank you, will let you know and he is presently asymptomatic and will continue to follow him closely

## 2023-03-19 ENCOUNTER — Ambulatory Visit: Payer: 59 | Admitting: Orthopedic Surgery

## 2023-03-19 DIAGNOSIS — M1711 Unilateral primary osteoarthritis, right knee: Secondary | ICD-10-CM | POA: Diagnosis not present

## 2023-03-19 DIAGNOSIS — M25462 Effusion, left knee: Secondary | ICD-10-CM | POA: Diagnosis not present

## 2023-03-21 ENCOUNTER — Encounter: Payer: Self-pay | Admitting: Orthopedic Surgery

## 2023-03-21 MED ORDER — METHYLPREDNISOLONE ACETATE 40 MG/ML IJ SUSP
40.0000 mg | INTRAMUSCULAR | Status: AC | PRN
Start: 2023-03-19 — End: 2023-03-19
  Administered 2023-03-19: 40 mg via INTRA_ARTICULAR

## 2023-03-21 MED ORDER — BUPIVACAINE HCL 0.25 % IJ SOLN
4.0000 mL | INTRAMUSCULAR | Status: AC | PRN
Start: 2023-03-19 — End: 2023-03-19
  Administered 2023-03-19: 4 mL via INTRA_ARTICULAR

## 2023-03-21 MED ORDER — LIDOCAINE HCL 1 % IJ SOLN
5.0000 mL | INTRAMUSCULAR | Status: AC | PRN
Start: 2023-03-19 — End: 2023-03-19
  Administered 2023-03-19: 5 mL

## 2023-03-21 NOTE — Progress Notes (Signed)
Office Visit Note   Patient: Keith Fisher           Date of Birth: 18-Jun-1946           MRN: [id number] Visit Date: 03/19/2023 Requested by: Farris Has, MD [address] [place],  [place] [address] PCP: Farris Has, MD  Subjective: Chief Complaint  Patient presents with   Right Knee - Pain    HPI: Keith Fisher is a 77 y.o. male who presents to the office reporting right knee pain.  Describes recurrent pain.  He does remain active.  Has a known history of back issues as well.  No interval injury..                ROS: All systems reviewed are negative as they relate to the chief complaint within the history of present illness.  Patient denies fevers or chills.  Assessment & Plan: Visit Diagnoses:  1. Effusion, left knee     Plan: Impression is end-stage right knee arthritis with effusion and increasing pain today.  Aspiration and injection performed.  He does have significant swelling in the right lower extremity due to parasitic infection which essentially prohibits his knee replacement based on review of the literature.  He will follow-up as needed.  Follow-Up Instructions: No follow-ups on file.   Orders:  No orders of the defined types were placed in this encounter.  No orders of the defined types were placed in this encounter.     Procedures: Large Joint Inj: R knee on 03/19/2023 12:07 PM Indications: diagnostic evaluation, joint swelling and pain Details: 18 G 1.5 in needle, superolateral approach  Arthrogram: No  Medications: 5 mL lidocaine 1 %; 40 mg methylPREDNISolone acetate 40 MG/ML; 4 mL bupivacaine 0.25 % Outcome: tolerated well, no immediate complications Procedure, treatment alternatives, risks and benefits explained, specific risks discussed. Consent was given by the patient. Immediately prior to procedure a time out was called to verify the correct patient, procedure, equipment, support staff and site/side marked as required. Patient was  prepped and draped in the usual sterile fashion.       Clinical Data: No additional findings.  Objective: Vital Signs: There were no vitals taken for this visit.  Physical Exam:  Constitutional: Patient appears well-developed HEENT:  Head: Normocephalic Eyes:EOM are normal Neck: Normal range of motion Cardiovascular: Normal rate Pulmonary/chest: Effort normal Neurologic: Patient is alert Skin: Skin is warm Psychiatric: Patient has normal mood and affect  Ortho Exam: Ortho exam demonstrates moderate right knee effusion.  Range of motion is about 0-1 05.  Collateral and cruciate ligaments are stable.  Extensor mechanism intact.  Has lateral greater than medial joint line tenderness.  Specialty Comments:  No specialty comments available.  Imaging: No results found.   PMFS History: Patient Active Problem List   Diagnosis Date Noted   Typical atrial flutter (HCC)    Tricuspid regurgitation 11/22/2021   Persistent atrial fibrillation (HCC) 11/22/2021   Myelopathy (HCC) 05/14/2019   Dyspnea on exertion 03/05/2017   Mitral regurgitation 03/05/2017   Past Medical History:  Diagnosis Date   Arthritis    Dyspnea    Lymphatic filariasis    Mitral valve regurgitation    Pulmonary hypertension (HCC)    Tricuspid regurgitation     History reviewed. No pertinent family history.  Past Surgical History:  Procedure Laterality Date   BUBBLE STUDY  11/22/2021   Procedure: BUBBLE STUDY;  Surgeon: Yates Decamp, MD;  Location: [facility] ENDOSCOPY;  Service: Cardiovascular;;   CARDIOVERSION N/A 11/22/2021   Procedure: CARDIOVERSION;  Surgeon: Yates Decamp, MD;  Location: [facility] ENDOSCOPY;  Service: Cardiovascular;  Laterality: N/A;   CARDIOVERSION N/A 12/27/2021   Procedure: CARDIOVERSION;  Surgeon: Yates Decamp, MD;  Location: [facility] ENDOSCOPY;  Service: Cardiovascular;  Laterality: N/A;   CARDIOVERSION N/A 01/25/2022   Procedure: CARDIOVERSION;  Surgeon: [name], MD;  Location: [facility]  ENDOSCOPY;  Service: Cardiovascular;  Laterality: N/A;   INGUINAL HERNIA REPAIR Right 06/13/2021   Procedure: LAPAROSCOPIC RIGHT INGUINAL HERNIA REPAIR WITH MESH;  Surgeon: Axel Filler, MD;  Location: [facility] OR;  Service: General;  Laterality: Right;   INSERTION OF MESH Right 06/13/2021   Procedure: INSERTION OF MESH;  Surgeon: Axel Filler, MD;  Location: [facility] OR;  Service: General;  Laterality: Right;   POSTERIOR CERVICAL FUSION/FORAMINOTOMY N/A 05/14/2019   Procedure: Posterior Cervical Fusion and decompression with lateral mass fixation - Cervical Three-Four,Cervical Four-Five, Cervical Five-Six, Cervical Six-Seven.;  Surgeon: Donalee Citrin, MD;  Location: [facility] OR;  Service: Neurosurgery;  Laterality: N/A;  posterior   RIGHT/LEFT HEART CATH AND CORONARY ANGIOGRAPHY N/A 11/22/2021   Procedure: RIGHT/LEFT HEART CATH AND CORONARY ANGIOGRAPHY;  Surgeon: Yates Decamp, MD;  Location: [facility] INVASIVE CV LAB;  Service: Cardiovascular;  Laterality: N/A;   SHOULDER SURGERY     TEE WITHOUT CARDIOVERSION N/A 03/06/2017   Procedure: TRANSESOPHAGEAL ECHOCARDIOGRAM (TEE);  Surgeon: Yates Decamp, MD;  Location: [facility] ENDOSCOPY;  Service: Cardiovascular;  Laterality: N/A;   TEE WITHOUT CARDIOVERSION N/A 11/22/2021   Procedure: TRANSESOPHAGEAL ECHOCARDIOGRAM (TEE);  Surgeon: Yates Decamp, MD;  Location: [facility] ENDOSCOPY;  Service: Cardiovascular;  Laterality: N/A;   Social History   Occupational History   Not on file  Tobacco Use   Smoking status: Never   Smokeless tobacco: Never  Vaping Use   Vaping Use: Never used  Substance and Sexual Activity   Alcohol use: No   Drug use: No   Sexual activity: Not on file

## 2023-05-24 DIAGNOSIS — G959 Disease of spinal cord, unspecified: Secondary | ICD-10-CM | POA: Diagnosis not present

## 2023-05-24 DIAGNOSIS — R262 Difficulty in walking, not elsewhere classified: Secondary | ICD-10-CM | POA: Diagnosis not present

## 2023-05-24 DIAGNOSIS — I4891 Unspecified atrial fibrillation: Secondary | ICD-10-CM | POA: Diagnosis not present

## 2023-05-24 DIAGNOSIS — Z23 Encounter for immunization: Secondary | ICD-10-CM | POA: Diagnosis not present

## 2023-05-24 DIAGNOSIS — S81801A Unspecified open wound, right lower leg, initial encounter: Secondary | ICD-10-CM | POA: Diagnosis not present

## 2023-08-14 ENCOUNTER — Ambulatory Visit: Payer: Self-pay | Admitting: Cardiology

## 2023-11-14 ENCOUNTER — Ambulatory Visit: Payer: Medicare (Managed Care) | Admitting: Orthopedic Surgery

## 2023-11-14 DIAGNOSIS — M1711 Unilateral primary osteoarthritis, right knee: Secondary | ICD-10-CM | POA: Diagnosis not present

## 2023-11-15 NOTE — Progress Notes (Signed)
 Office Visit Note   Patient: Keith Fisher           Date of Birth: 10/04/1945           MRN: [id number] Visit Date: 11/14/2023 Requested by: Farris Has, MD [address] [place],  [place] [address] PCP: Farris Has, MD  Subjective: Chief Complaint  Patient presents with   Right Knee - Pain    HPI: Keith Fisher is a 78 y.o. male who presents to the office reporting right knee pain.  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  Has not had an interval injury.  Does do well with injections typically..                ROS: All systems reviewed are negative as they relate to the chief complaint within the history of present illness.  Patient denies fevers or chills.  Assessment & Plan: Visit Diagnoses:  1. Arthritis of right knee     Plan: Impression is progressive right knee arthritis with mild effusion.  Aspiration injection performed today.  Will see how he does from those interventions.  Follow-up as needed.  Follow-Up Instructions: No follow-ups on file.   Orders:  No orders of the defined types were placed in this encounter.  No orders of the defined types were placed in this encounter.     Procedures: Large Joint Inj: R knee on 11/14/2023 2:22 PM Indications: diagnostic evaluation, joint swelling and pain Details: 18 G 1.5 in needle, superolateral approach  Arthrogram: No  Medications: 5 mL lidocaine 1 %; 40 mg methylPREDNISolone acetate 40 MG/ML; 4 mL bupivacaine 0.25 % Outcome: tolerated well, no immediate complications Procedure, treatment alternatives, risks and benefits explained, specific risks discussed. Consent was given by the patient. Immediately prior to procedure a time out was called to verify the correct patient, procedure, equipment,  support staff and site/side marked as required. Patient was prepped and draped in the usual sterile fashion.     40 cc aspirated  Clinical Data: No additional findings.  Objective: Vital Signs: There were no vitals taken for this visit.  Physical Exam:  Constitutional: Patient appears well-developed HEENT:  Head: Normocephalic Eyes:EOM are normal Neck: Normal range of motion Cardiovascular: Normal rate Pulmonary/chest: Effort normal Neurologic: Patient is alert Skin: Skin is warm Psychiatric: Patient has normal mood and affect  Ortho Exam: Ortho exam demonstrates about a 8 degree flexion contracture in the right leg with flexion past 90.  Extensor mechanism intact.  About the same amount of swelling is present in that right calf as there has been in prior visits.  No nerve root tension signs today.  No muscle atrophy in either leg.  Specialty Comments:  No specialty comments available.  Imaging: No results found.   PMFS History: Patient Active Problem List   Diagnosis Date Noted   Typical atrial flutter (HCC)    Tricuspid regurgitation 11/22/2021   Persistent atrial fibrillation (HCC) 11/22/2021   Myelopathy (HCC) 05/14/2019   Dyspnea on exertion 03/05/2017   Mitral regurgitation 03/05/2017   Past Medical History:  Diagnosis Date   Arthritis    Dyspnea    Lymphatic filariasis    Mitral valve regurgitation    Pulmonary hypertension (HCC)    Tricuspid regurgitation  No family history on file.  Past Surgical History:  Procedure Laterality Date   BUBBLE STUDY  11/22/2021   Procedure: BUBBLE STUDY;  Surgeon: Yates Decamp, MD;  Location: [facility] ENDOSCOPY;  Service: Cardiovascular;;   CARDIOVERSION N/A 11/22/2021   Procedure: CARDIOVERSION;  Surgeon: Yates Decamp, MD;  Location: [facility] ENDOSCOPY;  Service: Cardiovascular;  Laterality: N/A;   CARDIOVERSION N/A 12/27/2021   Procedure: CARDIOVERSION;  Surgeon: Yates Decamp, MD;  Location: [facility] ENDOSCOPY;  Service: Cardiovascular;   Laterality: N/A;   CARDIOVERSION N/A 01/25/2022   Procedure: CARDIOVERSION;  Surgeon: [name], MD;  Location: [facility] ENDOSCOPY;  Service: Cardiovascular;  Laterality: N/A;   INGUINAL HERNIA REPAIR Right 06/13/2021   Procedure: LAPAROSCOPIC RIGHT INGUINAL HERNIA REPAIR WITH MESH;  Surgeon: Axel Filler, MD;  Location: [facility] OR;  Service: General;  Laterality: Right;   INSERTION OF MESH Right 06/13/2021   Procedure: INSERTION OF MESH;  Surgeon: Axel Filler, MD;  Location: [facility] OR;  Service: General;  Laterality: Right;   POSTERIOR CERVICAL FUSION/FORAMINOTOMY N/A 05/14/2019   Procedure: Posterior Cervical Fusion and decompression with lateral mass fixation - Cervical Three-Four,Cervical Four-Five, Cervical Five-Six, Cervical Six-Seven.;  Surgeon: Donalee Citrin, MD;  Location: [facility] OR;  Service: Neurosurgery;  Laterality: N/A;  posterior   RIGHT/LEFT HEART CATH AND CORONARY ANGIOGRAPHY N/A 11/22/2021   Procedure: RIGHT/LEFT HEART CATH AND CORONARY ANGIOGRAPHY;  Surgeon: Yates Decamp, MD;  Location: [facility] INVASIVE CV LAB;  Service: Cardiovascular;  Laterality: N/A;   SHOULDER SURGERY     TEE WITHOUT CARDIOVERSION N/A 03/06/2017   Procedure: TRANSESOPHAGEAL ECHOCARDIOGRAM (TEE);  Surgeon: Yates Decamp, MD;  Location: [facility] ENDOSCOPY;  Service: Cardiovascular;  Laterality: N/A;   TEE WITHOUT CARDIOVERSION N/A 11/22/2021   Procedure: TRANSESOPHAGEAL ECHOCARDIOGRAM (TEE);  Surgeon: Yates Decamp, MD;  Location: [facility] ENDOSCOPY;  Service: Cardiovascular;  Laterality: N/A;   Social History   Occupational History   Not on file  Tobacco Use   Smoking status: Never   Smokeless tobacco: Never  Vaping Use   Vaping status: Never Used  Substance and Sexual Activity   Alcohol use: No   Drug use: No   Sexual activity: Not on file

## 2023-11-18 ENCOUNTER — Encounter: Payer: Self-pay | Admitting: Orthopedic Surgery

## 2023-11-18 MED ORDER — LIDOCAINE HCL 1 % IJ SOLN
5.0000 mL | INTRAMUSCULAR | Status: AC | PRN
Start: 2023-11-14 — End: 2023-11-14
  Administered 2023-11-14: 5 mL

## 2023-11-18 MED ORDER — METHYLPREDNISOLONE ACETATE 40 MG/ML IJ SUSP
40.0000 mg | INTRAMUSCULAR | Status: AC | PRN
Start: 2023-11-14 — End: 2023-11-14
  Administered 2023-11-14: 40 mg via INTRA_ARTICULAR

## 2023-11-18 MED ORDER — BUPIVACAINE HCL 0.25 % IJ SOLN
4.0000 mL | INTRAMUSCULAR | Status: AC | PRN
Start: 2023-11-14 — End: 2023-11-14
  Administered 2023-11-14: 4 mL via INTRA_ARTICULAR

## 2023-12-12 DIAGNOSIS — R269 Unspecified abnormalities of gait and mobility: Secondary | ICD-10-CM | POA: Diagnosis not present

## 2023-12-12 DIAGNOSIS — M792 Neuralgia and neuritis, unspecified: Secondary | ICD-10-CM | POA: Diagnosis not present

## 2023-12-12 DIAGNOSIS — M1711 Unilateral primary osteoarthritis, right knee: Secondary | ICD-10-CM | POA: Diagnosis not present

## 2023-12-12 DIAGNOSIS — E538 Deficiency of other specified B group vitamins: Secondary | ICD-10-CM | POA: Diagnosis not present

## 2023-12-12 DIAGNOSIS — M7989 Other specified soft tissue disorders: Secondary | ICD-10-CM | POA: Diagnosis not present

## 2023-12-26 DIAGNOSIS — E538 Deficiency of other specified B group vitamins: Secondary | ICD-10-CM | POA: Diagnosis not present

## 2024-01-04 DIAGNOSIS — R262 Difficulty in walking, not elsewhere classified: Secondary | ICD-10-CM | POA: Diagnosis not present

## 2024-01-10 DIAGNOSIS — R262 Difficulty in walking, not elsewhere classified: Secondary | ICD-10-CM | POA: Diagnosis not present

## 2024-01-21 DIAGNOSIS — M9905 Segmental and somatic dysfunction of pelvic region: Secondary | ICD-10-CM | POA: Diagnosis not present

## 2024-01-21 DIAGNOSIS — M9903 Segmental and somatic dysfunction of lumbar region: Secondary | ICD-10-CM | POA: Diagnosis not present

## 2024-01-21 DIAGNOSIS — M9901 Segmental and somatic dysfunction of cervical region: Secondary | ICD-10-CM | POA: Diagnosis not present

## 2024-01-21 DIAGNOSIS — M9902 Segmental and somatic dysfunction of thoracic region: Secondary | ICD-10-CM | POA: Diagnosis not present

## 2024-01-22 DIAGNOSIS — E119 Type 2 diabetes mellitus without complications: Secondary | ICD-10-CM | POA: Diagnosis not present

## 2024-01-22 DIAGNOSIS — E538 Deficiency of other specified B group vitamins: Secondary | ICD-10-CM | POA: Diagnosis not present

## 2024-01-22 DIAGNOSIS — R300 Dysuria: Secondary | ICD-10-CM | POA: Diagnosis not present

## 2024-01-28 DIAGNOSIS — M9902 Segmental and somatic dysfunction of thoracic region: Secondary | ICD-10-CM | POA: Diagnosis not present

## 2024-01-28 DIAGNOSIS — M9903 Segmental and somatic dysfunction of lumbar region: Secondary | ICD-10-CM | POA: Diagnosis not present

## 2024-02-05 DIAGNOSIS — M9905 Segmental and somatic dysfunction of pelvic region: Secondary | ICD-10-CM | POA: Diagnosis not present

## 2024-02-05 DIAGNOSIS — M9902 Segmental and somatic dysfunction of thoracic region: Secondary | ICD-10-CM | POA: Diagnosis not present

## 2024-02-05 DIAGNOSIS — M9903 Segmental and somatic dysfunction of lumbar region: Secondary | ICD-10-CM | POA: Diagnosis not present

## 2024-02-05 DIAGNOSIS — M9901 Segmental and somatic dysfunction of cervical region: Secondary | ICD-10-CM | POA: Diagnosis not present

## 2024-02-06 ENCOUNTER — Emergency Department (HOSPITAL_BASED_OUTPATIENT_CLINIC_OR_DEPARTMENT_OTHER)
Admission: EM | Admit: 2024-02-06 | Discharge: 2024-02-06 | Disposition: A | Discharge status: Home / Self Care | Payer: Medicare (Managed Care) | Attending: Emergency Medicine | Admitting: Emergency Medicine

## 2024-02-06 ENCOUNTER — Other Ambulatory Visit: Payer: Self-pay

## 2024-02-06 ENCOUNTER — Emergency Department (HOSPITAL_BASED_OUTPATIENT_CLINIC_OR_DEPARTMENT_OTHER): Payer: Medicare (Managed Care)

## 2024-02-06 DIAGNOSIS — R600 Localized edema: Secondary | ICD-10-CM | POA: Insufficient documentation

## 2024-02-06 DIAGNOSIS — M79605 Pain in left leg: Secondary | ICD-10-CM | POA: Insufficient documentation

## 2024-02-06 DIAGNOSIS — Z7901 Long term (current) use of anticoagulants: Secondary | ICD-10-CM | POA: Diagnosis not present

## 2024-02-06 DIAGNOSIS — M79662 Pain in left lower leg: Secondary | ICD-10-CM | POA: Diagnosis not present

## 2024-02-06 NOTE — ED Provider Notes (Addendum)
 [place] EMERGENCY DEPARTMENT AT [facility] Provider Note   CSN: [id number] Arrival date & time: 02/06/24  1556     History  Chief Complaint  Patient presents with   Leg Pain    MARQUS MACPHEE is a 78 y.o. male.  78 year old male presents with complaint of pain in his left leg.  Ongoing for the past few months, described as electrical sensation in his leg, more painful in the left calf.  Patient went to a walk-in clinic today and was sent to the ER to evaluate for DVT.  No prior history of PE or DVT.  Denies chest pain or shortness of breath.  Is on Xarelto .  No other complaints or concerns.       Home Medications Prior to Admission medications   Medication Sig Start Date End Date Taking? Authorizing Provider  acetaminophen  (TYLENOL ) 500 MG tablet Take 500 mg by mouth every 6 (six) hours as needed for moderate pain.    [provider]  acetaminophen -codeine  (TYLENOL  #3) 300-30 MG tablet Take 1 tablet by mouth every 8 (eight) hours as needed for moderate pain. 01/19/22   Jasmine Mesi, MD  furosemide  (LASIX ) 40 MG tablet Take 1 tablet (40 mg total) by mouth daily as needed for fluid or edema. 03/08/23 03/02/24  Knox Perl, MD  gabapentin  (NEURONTIN ) 600 MG tablet Take 600 mg by mouth 3 (three) times daily as needed. 03/10/22   [provider]  potassium chloride  SA (KLOR-CON  M20) 20 MEQ tablet Take 2 tablets (40 mEq total) by mouth daily as needed (with Lasix ). Patient taking differently: Take 20 mEq by mouth daily. 01/26/22 02/12/23  Cantwell, Celeste C, PA-C  traMADol  (ULTRAM ) 50 MG tablet Take 1 tablet (50 mg total) by mouth every 6 (six) hours as needed. 02/26/23   Knox Perl, MD  XARELTO  20 MG TABS tablet TAKE 1 TABLET(20 MG) BY MOUTH DAILY WITH SUPPER 05/26/22   Knox Perl, MD      Allergies    Digoxin  and related and Metoprolol     Review of Systems   Review of Systems Negative except as per HPI Physical Exam Updated Vital Signs BP 116/63 (BP  Location: Left Arm)   Pulse 98   Temp 98.2 F (36.8 C)   Resp 20   SpO2 100%  Physical Exam Vitals and nursing note reviewed.  Constitutional:      General: He is not in acute distress.    Appearance: He is well-developed. He is not diaphoretic.  HENT:     Head: Normocephalic and atraumatic.  Cardiovascular:     Pulses: Normal pulses.  Pulmonary:     Effort: Pulmonary effort is normal.  Abdominal:     Palpations: Abdomen is soft.     Tenderness: There is no abdominal tenderness.  Musculoskeletal:        General: Tenderness present.     Right lower leg: Edema present.     Left lower leg: No edema.     Comments: Chronic swelling RLE TTP left calf, no palpable cords, no erythema.  Skin:    General: Skin is warm and dry.  Neurological:     Mental Status: He is alert and oriented to person, place, and time.     Sensory: No sensory deficit.     Motor: No weakness.  Psychiatric:        Behavior: Behavior normal.     ED Results / Procedures / Treatments   Labs (all labs ordered are listed, but  only abnormal results are displayed) Labs Reviewed - No data to display  EKG None  Radiology US  Venous Img Lower  Left (DVT Study) Result Date: 02/06/2024 CLINICAL DATA:  Left lower extremity pain and tingling. EXAM: Left LOWER EXTREMITY VENOUS DOPPLER ULTRASOUND TECHNIQUE: Gray-scale sonography with compression, as well as color and duplex ultrasound, were performed to evaluate the deep venous system(s) from the level of the common femoral vein through the popliteal and proximal calf veins. COMPARISON:  None Available. FINDINGS: VENOUS Normal compressibility of the common femoral, superficial femoral, and popliteal veins, as well as the visualized calf veins. Visualized portions of profunda femoral vein and great saphenous vein unremarkable. No filling defects to suggest DVT on grayscale or color Doppler imaging. Doppler waveforms show normal direction of venous flow, normal respiratory  plasticity and response to augmentation. Limited views of the contralateral common femoral vein are unremarkable. OTHER None. Limitations: none IMPRESSION: Negative. Electronically Signed   By: Angus Bark M.D.   On: 02/06/2024 18:39    Procedures Procedures    Medications Ordered in ED Medications - No data to display  ED Course/ Medical Decision Making/ A&P                                 Medical Decision Making  78 year old male presents emergency room with complaint of pain in his left calf, sent by outpatient clinic for Doppler study to rule out DVT.  Patient is on Xarelto  currently.  He does have calf tenderness.  Venous duplex of the left lower extremity was ordered for further evaluation. Venous Doppler is negative for DVT.  Patient advised to follow-up with primary care provider.        Final Clinical Impression(s) / ED Diagnoses Final diagnoses:  Pain of left calf    Rx / DC Orders ED Discharge Orders     None         Darlis Eisenmenger, PA-C 02/06/24 1844    Darlis Eisenmenger, PA-C 02/06/24 1845    Lowery Rue, DO 02/06/24 1848

## 2024-02-06 NOTE — Discharge Instructions (Signed)
 Your ultrasound does not show a blood clot in your leg.  Please follow up with your primary care provider.

## 2024-02-06 NOTE — ED Notes (Signed)
 RN reviewed discharge instructions with pt. Pt verbalized understanding and had no further questions. VSS upon discharge.

## 2024-02-06 NOTE — ED Triage Notes (Signed)
 Pt alert c/o L leg pain and tingling "for a few months," further reporting they went to walk in clinic today who recommended pt go to ED for US  for DVT r/o.

## 2024-02-07 ENCOUNTER — Other Ambulatory Visit (INDEPENDENT_AMBULATORY_CARE_PROVIDER_SITE_OTHER): Payer: Medicare (Managed Care)

## 2024-02-07 ENCOUNTER — Encounter: Payer: Self-pay | Admitting: Orthopedic Surgery

## 2024-02-07 ENCOUNTER — Ambulatory Visit: Payer: Medicare (Managed Care) | Admitting: Orthopedic Surgery

## 2024-02-07 DIAGNOSIS — M25512 Pain in left shoulder: Secondary | ICD-10-CM | POA: Diagnosis not present

## 2024-02-07 DIAGNOSIS — M542 Cervicalgia: Secondary | ICD-10-CM

## 2024-02-07 DIAGNOSIS — R29898 Other symptoms and signs involving the musculoskeletal system: Secondary | ICD-10-CM

## 2024-02-07 DIAGNOSIS — M79605 Pain in left leg: Secondary | ICD-10-CM | POA: Diagnosis not present

## 2024-02-07 DIAGNOSIS — M1712 Unilateral primary osteoarthritis, left knee: Secondary | ICD-10-CM | POA: Diagnosis not present

## 2024-02-07 DIAGNOSIS — M25562 Pain in left knee: Secondary | ICD-10-CM | POA: Diagnosis not present

## 2024-02-07 NOTE — Progress Notes (Signed)
 Office Visit Note   Patient: Keith Fisher           Date of Birth: 1945-12-10           MRN: [id number] Visit Date: 02/07/2024 Requested by: Ronna Coho, MD [address] [place],  [place] [address] PCP: Ronna Coho, MD  Subjective: Chief Complaint  Patient presents with   Left Shoulder - Pain   Neck - Pain   Left Leg - Pain    HPI: Keith Fisher is a 78 y.o. male who presents to the office reporting multiple orthopedic complaints.  He has had several falls on 528.  Fell 2-3 times but it was more of a sliding out of bed's type situation.  At the time that he slid out of a chair.  He does have known history of severe right knee arthritis with parasite infection chronic in that right calf.  Notes having some left hip leg and knee pain.  Also hurts him to move his neck and arm.  Hurts him to walk.  He did have a history of cervical spine fusion which was extensive from a posterior approach done several years ago.  Also had a history of decompressive laminectomy on the lumbar spine.  The surgeries were done by Dr. Lamon Pillow..                ROS: All systems reviewed are negative as they relate to the chief complaint within the history of present illness.  Patient denies fevers or chills.  Assessment & Plan: Visit Diagnoses:  1. Left shoulder pain, unspecified chronicity   2. Neck pain   3. Left leg pain   4. Left knee pain, unspecified chronicity   5. Bilateral leg weakness     Plan: Impression is possible myelopathy the giving him some weakness and falling.  Alternatively this could be exacerbation of some left knee arthritis but as he does have an effusion in the knee.  No fracture on radiographs.  Does have some hip flexion weakness and abduction weakness in the arms.  No hyperreflexia in the lower extremities or upper extremities.  Does report some tingling in that left leg.  Also has some tingling in the arms but is hard to know if that is new or not.  Bottom line here is  that marijuana is having some falls with extensive neck surgery several years ago.  Concern at this time would be myelopathy versus left leg radiculopathy.  MRI scan cervical spine and lumbar spine indicated.  Refer to Dr. Daisey Dryer for L-spine ESI.  Left knee also aspirated and injected today.  I can call either he or his son with those results and we we will potentially get him set up to see Dr. Lamon Pillow for follow-up.  Follow-Up Instructions: No follow-ups on file.   Orders:  Orders Placed This Encounter  Procedures   XR Shoulder Left   XR Cervical Spine 2 or 3 views   XR HIP UNILAT W OR W/O PELVIS 2-3 VIEWS LEFT   XR Lumbar Spine 2-3 Views   XR Knee 1-2 Views Left   MR Cervical Spine w/o contrast   MR Lumbar Spine w/o contrast   Ambulatory referral to Physical Medicine Rehab   No orders of the defined types were placed in this encounter.     Procedures: Large Joint Inj: L knee on 02/07/2024 8:03 PM Indications: diagnostic evaluation, joint swelling and pain Details: 18 G 1.5 in needle, superolateral approach  Arthrogram: No  Medications: 5 mL lidocaine  1 %; 4 mL bupivacaine  0.25 %; 40 mg triamcinolone  acetonide 40 MG/ML Outcome: tolerated well, no immediate complications Procedure, treatment alternatives, risks and benefits explained, specific risks discussed. Consent was given by the patient. Immediately prior to procedure a time out was called to verify the correct patient, procedure, equipment, support staff and site/side marked as required. Patient was prepped and draped in the usual sterile fashion.       Clinical Data: No additional findings.  Objective: Vital Signs: There were no vitals taken for this visit.  Physical Exam:  Constitutional: Patient appears well-developed HEENT:  Head: Normocephalic Eyes:EOM are normal Neck: Normal range of motion Cardiovascular: Normal rate Pulmonary/chest: Effort normal Neurologic: Patient is alert Skin: Skin is  warm Psychiatric: Patient has normal mood and affect  Ortho Exam: Ortho exam demonstrates 5 out of 5 ankle dorsiflexion strength and plantarflexion strength.  Quad and hamstring strength generally okay but hip flexion strength on the left is about 4 out of 5 compared to 5 out of 5 on the right.  No groin pain with internal or external rotation of either leg.  Pedal pulses palpable.  No muscle atrophy or clonus in the lower extremities.  Right calf is bigger than the left At that is chronic.  Mild effusion left knee no effusion right knee.  Extensor mechanism intact bilaterally.  1-2+ reflexes bilateral patella Achilles biceps and triceps.  Radial pulse intact bilaterally.  Both feet are perfused.  Mild paresthesias not really anatomic in that left leg as well as both upper extremities.  Patient has predictably stiff neck from his prior surgery.  Has good and symmetric grip EPL FPL interosseous wrist flexion extension bicep triceps and deltoid strength but he does have some interosseous wasting in both hands.  Specialty Comments:  No specialty comments available.  Imaging: XR HIP UNILAT W OR W/O PELVIS 2-3 VIEWS LEFT Result Date: 02/07/2024 AP pelvis lateral radiographs left hip reviewed.  No acute fracture.  Joint space maintained.  Remainder of bony pelvis normal.  XR Cervical Spine 2 or 3 views Result Date: 02/07/2024 AP lateral radiographs cervical spine reviewed.  Prior posterior cervical fusion hardware in good position alignment with no complicating features.  Cervical spine kyphosis is present.  No new visible bony injury.  XR Lumbar Spine 2-3 Views Result Date: 02/07/2024 AP lateral radiographs lumbar spine reviewed.  There is scoliosis present.  Significant arthritic and degenerative changes present between the vertebral bodies and in the facet joints.  No compression fractures.  No significant spondylolisthesis.  XR Shoulder Left Result Date: 02/07/2024 AP outlet axillary lateral  radiographs left shoulder reviewed.  No acute fracture.  Shoulder is located.  Acromiohumeral distance is normal.  No significant degenerative changes in the glenohumeral or AC joint.  Visualized lung fields clear.   XR Knee 1-2 Views Left Result Date: 02/07/2024 AP lateral radiographs left knee reviewed.  Moderate arthritis is present in the medial compartment with near bone-on-bone changes.  No acute fracture.  Alignment slight varus  US  Venous Img Lower  Left (DVT Study) Result Date: 02/06/2024 CLINICAL DATA:  Left lower extremity pain and tingling. EXAM: Left LOWER EXTREMITY VENOUS DOPPLER ULTRASOUND TECHNIQUE: Gray-scale sonography with compression, as well as color and duplex ultrasound, were performed to evaluate the deep venous system(s) from the level of the common femoral vein through the popliteal and proximal calf veins. COMPARISON:  None Available. FINDINGS: VENOUS Normal compressibility of the common femoral, superficial femoral, and  popliteal veins, as well as the visualized calf veins. Visualized portions of profunda femoral vein and great saphenous vein unremarkable. No filling defects to suggest DVT on grayscale or color Doppler imaging. Doppler waveforms show normal direction of venous flow, normal respiratory plasticity and response to augmentation. Limited views of the contralateral common femoral vein are unremarkable. OTHER None. Limitations: none IMPRESSION: Negative. Electronically Signed   By: Angus Bark M.D.   On: 02/06/2024 18:39     PMFS History: Patient Active Problem List   Diagnosis Date Noted   Typical atrial flutter (HCC)    Tricuspid regurgitation 11/22/2021   Persistent atrial fibrillation (HCC) 11/22/2021   Myelopathy (HCC) 05/14/2019   Dyspnea on exertion 03/05/2017   Mitral regurgitation 03/05/2017   Past Medical History:  Diagnosis Date   Arthritis    Dyspnea    Lymphatic filariasis    Mitral valve regurgitation    Pulmonary hypertension (HCC)     Tricuspid regurgitation     History reviewed. No pertinent family history.  Past Surgical History:  Procedure Laterality Date   BUBBLE STUDY  11/22/2021   Procedure: BUBBLE STUDY;  Surgeon: Knox Perl, MD;  Location: [facility] ENDOSCOPY;  Service: Cardiovascular;;   CARDIOVERSION N/A 11/22/2021   Procedure: CARDIOVERSION;  Surgeon: Knox Perl, MD;  Location: [facility] ENDOSCOPY;  Service: Cardiovascular;  Laterality: N/A;   CARDIOVERSION N/A 12/27/2021   Procedure: CARDIOVERSION;  Surgeon: Knox Perl, MD;  Location: [facility] ENDOSCOPY;  Service: Cardiovascular;  Laterality: N/A;   CARDIOVERSION N/A 01/25/2022   Procedure: CARDIOVERSION;  Surgeon: Cody Das, MD;  Location: [facility] ENDOSCOPY;  Service: Cardiovascular;  Laterality: N/A;   INGUINAL HERNIA REPAIR Right 06/13/2021   Procedure: LAPAROSCOPIC RIGHT INGUINAL HERNIA REPAIR WITH MESH;  Surgeon: Shela Derby, MD;  Location: [facility] OR;  Service: General;  Laterality: Right;   INSERTION OF MESH Right 06/13/2021   Procedure: INSERTION OF MESH;  Surgeon: Shela Derby, MD;  Location: [facility] OR;  Service: General;  Laterality: Right;   POSTERIOR CERVICAL FUSION/FORAMINOTOMY N/A 05/14/2019   Procedure: Posterior Cervical Fusion and decompression with lateral mass fixation - Cervical Three-Four,Cervical Four-Five, Cervical Five-Six, Cervical Six-Seven.;  Surgeon: Gearl Keens, MD;  Location: [facility] OR;  Service: Neurosurgery;  Laterality: N/A;  posterior   RIGHT/LEFT HEART CATH AND CORONARY ANGIOGRAPHY N/A 11/22/2021   Procedure: RIGHT/LEFT HEART CATH AND CORONARY ANGIOGRAPHY;  Surgeon: Knox Perl, MD;  Location: [facility] INVASIVE CV LAB;  Service: Cardiovascular;  Laterality: N/A;   SHOULDER SURGERY     TEE WITHOUT CARDIOVERSION N/A 03/06/2017   Procedure: TRANSESOPHAGEAL ECHOCARDIOGRAM (TEE);  Surgeon: Knox Perl, MD;  Location: [facility] ENDOSCOPY;  Service: Cardiovascular;  Laterality: N/A;   TEE WITHOUT CARDIOVERSION N/A 11/22/2021   Procedure: TRANSESOPHAGEAL ECHOCARDIOGRAM  (TEE);  Surgeon: Knox Perl, MD;  Location: [facility] ENDOSCOPY;  Service: Cardiovascular;  Laterality: N/A;   Social History   Occupational History   Not on file  Tobacco Use   Smoking status: Never   Smokeless tobacco: Never  Vaping Use   Vaping status: Never Used  Substance and Sexual Activity   Alcohol use: No   Drug use: No   Sexual activity: Not on file

## 2024-02-10 MED ORDER — TRIAMCINOLONE ACETONIDE 40 MG/ML IJ SUSP
40.0000 mg | INTRAMUSCULAR | Status: AC | PRN
  Administered 2024-02-07: 40 mg via INTRA_ARTICULAR

## 2024-02-10 MED ORDER — LIDOCAINE HCL 1 % IJ SOLN
5.0000 mL | INTRAMUSCULAR | Status: AC | PRN
  Administered 2024-02-07: 5 mL

## 2024-02-10 MED ORDER — BUPIVACAINE HCL 0.25 % IJ SOLN
4.0000 mL | INTRAMUSCULAR | Status: AC | PRN
  Administered 2024-02-07: 4 mL via INTRA_ARTICULAR

## 2024-02-12 DIAGNOSIS — M9901 Segmental and somatic dysfunction of cervical region: Secondary | ICD-10-CM | POA: Diagnosis not present

## 2024-02-12 DIAGNOSIS — M9902 Segmental and somatic dysfunction of thoracic region: Secondary | ICD-10-CM | POA: Diagnosis not present

## 2024-02-12 DIAGNOSIS — M9905 Segmental and somatic dysfunction of pelvic region: Secondary | ICD-10-CM | POA: Diagnosis not present

## 2024-02-12 DIAGNOSIS — M9903 Segmental and somatic dysfunction of lumbar region: Secondary | ICD-10-CM | POA: Diagnosis not present

## 2024-02-14 ENCOUNTER — Encounter: Payer: Self-pay | Admitting: Orthopedic Surgery

## 2024-02-14 DIAGNOSIS — R296 Repeated falls: Secondary | ICD-10-CM | POA: Diagnosis not present

## 2024-02-14 DIAGNOSIS — M542 Cervicalgia: Secondary | ICD-10-CM | POA: Diagnosis not present

## 2024-02-14 DIAGNOSIS — R2689 Other abnormalities of gait and mobility: Secondary | ICD-10-CM | POA: Diagnosis not present

## 2024-02-14 DIAGNOSIS — M549 Dorsalgia, unspecified: Secondary | ICD-10-CM | POA: Diagnosis not present

## 2024-02-20 DIAGNOSIS — M9905 Segmental and somatic dysfunction of pelvic region: Secondary | ICD-10-CM | POA: Diagnosis not present

## 2024-02-20 DIAGNOSIS — M9901 Segmental and somatic dysfunction of cervical region: Secondary | ICD-10-CM | POA: Diagnosis not present

## 2024-02-20 DIAGNOSIS — M9902 Segmental and somatic dysfunction of thoracic region: Secondary | ICD-10-CM | POA: Diagnosis not present

## 2024-02-20 DIAGNOSIS — M9903 Segmental and somatic dysfunction of lumbar region: Secondary | ICD-10-CM | POA: Diagnosis not present

## 2024-02-21 ENCOUNTER — Other Ambulatory Visit: Payer: Self-pay

## 2024-02-21 DIAGNOSIS — M7989 Other specified soft tissue disorders: Secondary | ICD-10-CM | POA: Diagnosis not present

## 2024-02-21 DIAGNOSIS — R262 Difficulty in walking, not elsewhere classified: Secondary | ICD-10-CM | POA: Diagnosis not present

## 2024-02-21 DIAGNOSIS — E119 Type 2 diabetes mellitus without complications: Secondary | ICD-10-CM | POA: Diagnosis not present

## 2024-02-21 DIAGNOSIS — I4891 Unspecified atrial fibrillation: Secondary | ICD-10-CM | POA: Diagnosis not present

## 2024-02-21 DIAGNOSIS — E538 Deficiency of other specified B group vitamins: Secondary | ICD-10-CM | POA: Diagnosis not present

## 2024-02-21 MED ORDER — FUROSEMIDE 40 MG PO TABS: 40.0000 mg | ORAL_TABLET | Freq: Every day | ORAL | 0 refills | Status: DC | PRN

## 2024-02-22 DIAGNOSIS — R2689 Other abnormalities of gait and mobility: Secondary | ICD-10-CM | POA: Diagnosis not present

## 2024-02-25 ENCOUNTER — Ambulatory Visit (HOSPITAL_COMMUNITY)
Admission: RE | Admit: 2024-02-25 | Discharge: 2024-02-25 | Disposition: A | Discharge status: Home / Self Care | Payer: Medicare (Managed Care) | Source: Ambulatory Visit | Attending: Cardiology | Admitting: Cardiology

## 2024-02-25 ENCOUNTER — Ambulatory Visit: Payer: Self-pay | Admitting: Cardiology

## 2024-02-25 DIAGNOSIS — I071 Rheumatic tricuspid insufficiency: Secondary | ICD-10-CM

## 2024-02-25 DIAGNOSIS — I428 Other cardiomyopathies: Secondary | ICD-10-CM

## 2024-02-25 DIAGNOSIS — I480 Paroxysmal atrial fibrillation: Secondary | ICD-10-CM | POA: Diagnosis not present

## 2024-02-25 LAB — ECHOCARDIOGRAM COMPLETE
MV M vel: 5.02 m/s
MV Peak grad: 100.8 mmHg
Radius: 0.6 cm
S' Lateral: 3.4 cm

## 2024-02-25 NOTE — Progress Notes (Signed)
 No change from 03/08/23 echo with severe TR and moderate MR and preserved LV function and RV function but with mild RV dilatation and mild to moderate pulmonary hypertension. Discuss on OV soon

## 2024-02-28 ENCOUNTER — Ambulatory Visit
Admission: RE | Admit: 2024-02-28 | Discharge: 2024-02-28 | Disposition: A | Discharge status: Home / Self Care | Payer: Medicare (Managed Care) | Source: Ambulatory Visit | Attending: Orthopedic Surgery | Admitting: Orthopedic Surgery

## 2024-02-28 DIAGNOSIS — M4802 Spinal stenosis, cervical region: Secondary | ICD-10-CM | POA: Diagnosis not present

## 2024-02-28 DIAGNOSIS — R29898 Other symptoms and signs involving the musculoskeletal system: Secondary | ICD-10-CM

## 2024-02-28 DIAGNOSIS — M5136 Other intervertebral disc degeneration, lumbar region with discogenic back pain only: Secondary | ICD-10-CM | POA: Diagnosis not present

## 2024-02-28 DIAGNOSIS — G9589 Other specified diseases of spinal cord: Secondary | ICD-10-CM | POA: Diagnosis not present

## 2024-02-28 DIAGNOSIS — M5031 Other cervical disc degeneration,  high cervical region: Secondary | ICD-10-CM | POA: Diagnosis not present

## 2024-02-28 DIAGNOSIS — M48061 Spinal stenosis, lumbar region without neurogenic claudication: Secondary | ICD-10-CM | POA: Diagnosis not present

## 2024-02-28 DIAGNOSIS — M542 Cervicalgia: Secondary | ICD-10-CM

## 2024-02-28 DIAGNOSIS — M47816 Spondylosis without myelopathy or radiculopathy, lumbar region: Secondary | ICD-10-CM | POA: Diagnosis not present

## 2024-02-28 DIAGNOSIS — M4312 Spondylolisthesis, cervical region: Secondary | ICD-10-CM | POA: Diagnosis not present

## 2024-03-03 ENCOUNTER — Encounter: Payer: Self-pay | Admitting: Cardiology

## 2024-03-03 ENCOUNTER — Ambulatory Visit: Payer: Medicare (Managed Care) | Attending: Cardiology | Admitting: Cardiology

## 2024-03-03 VITALS — BP 128/77 | HR 119 | Resp 16 | Ht 70.0 in | Wt 165.0 lb

## 2024-03-03 DIAGNOSIS — B748 Other filariases: Secondary | ICD-10-CM

## 2024-03-03 DIAGNOSIS — I340 Nonrheumatic mitral (valve) insufficiency: Secondary | ICD-10-CM

## 2024-03-03 DIAGNOSIS — I071 Rheumatic tricuspid insufficiency: Secondary | ICD-10-CM

## 2024-03-03 DIAGNOSIS — I4821 Permanent atrial fibrillation: Secondary | ICD-10-CM

## 2024-03-03 DIAGNOSIS — I480 Paroxysmal atrial fibrillation: Secondary | ICD-10-CM

## 2024-03-03 DIAGNOSIS — I5032 Chronic diastolic (congestive) heart failure: Secondary | ICD-10-CM

## 2024-03-03 NOTE — Patient Instructions (Signed)
 Medication Instructions:  Your physician recommends that you continue on your current medications as directed. Please refer to the Current Medication list given to you today.  *If you need a refill on your cardiac medications before your next appointment, please call your pharmacy*  Lab Work: Have lab work (lipid profile) checked today at the lab on the first floor If you have labs (blood work) drawn today and your tests are completely normal, you will receive your results only by: MyChart Message (if you have MyChart) OR A paper copy in the mail If you have any lab test that is abnormal or we need to change your treatment, we will call you to review the results.  Testing/Procedures: Your physician has requested that you have an echocardiogram in one year. Echocardiography is a painless test that uses sound waves to create images of your heart. It provides your doctor with information about the size and shape of your heart and how well your heart's chambers and valves are working. This procedure takes approximately one hour. There are no restrictions for this procedure. Please do NOT wear cologne, perfume, aftershave, or lotions (deodorant is allowed). Please arrive 15 minutes prior to your appointment time.  Please note: We ask at that you not bring children with you during ultrasound (echo/ vascular) testing. Due to room size and safety concerns, children are not allowed in the ultrasound rooms during exams. Our front office staff cannot provide observation of children in our lobby area while testing is being conducted. An adult accompanying a patient to their appointment will only be allowed in the ultrasound room at the discretion of the ultrasound technician under special circumstances. We apologize for any inconvenience.   Follow-Up: At [facility], you and your health needs are our priority.  As part of our continuing mission to provide you with exceptional heart care, our  providers are all part of one team.  This team includes your primary Cardiologist (physician) and Advanced Practice Providers or APPs (Physician Assistants and Nurse Practitioners) who all work together to provide you with the care you need, when you need it.  Your next appointment:   12 month(s) after echo  Provider:   Gordy Bergamo, MD    We recommend signing up for the patient portal called MyChart.  Sign up information is provided on this After Visit Summary.  MyChart is used to connect with patients for Virtual Visits (Telemedicine).  Patients are able to view lab/test results, encounter notes, upcoming appointments, etc.  Non-urgent messages can be sent to your provider as well.   To learn more about what you can do with MyChart, go to [email or web address].   Other Instructions

## 2024-03-03 NOTE — Progress Notes (Signed)
 " Cardiology Office Note:  .   Date:  03/04/2024  ID:  [name], DOB Mar 25, 1946, MRN [id number] PCP: Kip Righter, MD  [facility] Providers Cardiologist:  Gordy Bergamo, MD   History of Present Illness: Keith Fisher   Keith Fisher is a 78 y.o. with history of filaria since and has chronic lymphedema involving his right leg, mild chronic dyspnea, mitral valve prolapse with moderate mitral regurgitation, severe tricuspid regurgitation, fairly active and chronic bradycardia asymptomatic by EKG, he had new onset of atrial fibrillation on 10/12/2021 and also had worsening of valvular heart disease and acute diastolic heart failure. He has had multiple cardioversions, did not tolerate amiodarone  due to marked bradycardia hence was discontinued.  Right and left heart catheterization on 11/22/2021 revealed mild pulm hypertension with mildly reduced cardiac index at 1.82 or normal coronary arteries.  Discussed the use of AI scribe software for clinical note transcription with the patient, who gave verbal consent to proceed.  History of Present Illness ANTOINNE Fisher is a 78 year old male with atrial fibrillation and valvular heart disease who presents for cardiovascular follow-up.  He has atrial fibrillation and is on Xarelto  20 mg once daily in the evening with food. He experiences no symptoms of atrial fibrillation and prefers to avoid invasive procedures. He takes furosemide  daily for leg swelling.  He has valvular heart disease with a moderately leaking mitral valve and a severely leaking tricuspid valve, stable since 2021. He experiences no symptoms of heart failure, such as shortness of breath or leg swelling.  He experiences chronic lymphedema with significant edema and uses support stockings daily. He has difficulty finding the right compression socks and has not used a lymphatic pump at home. He has significant difficulty walking, requiring a cane and walker, and has stopped playing tennis but occasionally  goes to the gym for light workouts.  Labs   External Labs:  PCP labs on Care Everywhere 01/28/2024:  A1c 7.1%.  Hb 11.5/HCT 33.4, platelets 229, normal indicis.  Serum glucose 97 mg, BUN 19, creatinine 1.18, EGFR 64 mL, sodium 140, potassium 4.2, LFTs normal.  B12 lower limit of normal at 236.  Labs 02/21/2024:  Hb 11.3/HCT 33.2, platelets 241.  TSH scratch that  TSH normal at 1.16.0.  ROS  Review of Systems  Cardiovascular:  Positive for leg swelling (chronic right leg lymphedema). Negative for chest pain and dyspnea on exertion.  Musculoskeletal:  Positive for back pain.    Physical Exam:   VS:  BP 128/77 (BP Location: Left Arm, Cuff Size: Normal)   Pulse (!) 119    Wt Readings from Last 3 Encounters:  02/12/23 184 lb (83.5 kg)  05/25/22 179 lb (81.2 kg)  02/02/22 191 lb (86.6 kg)    Physical Exam Neck:     Vascular: No JVD.   Cardiovascular:     Rate and Rhythm: Normal rate and regular rhythm.     Pulses: Intact distal pulses.     Heart sounds: S1 normal and S2 normal. Murmur heard.     Blowing holosystolic murmur is present with a grade of 2/6 at the lower right sternal border and apex.     No gallop.  Pulmonary:     Effort: Pulmonary effort is normal.     Breath sounds: Normal breath sounds.  Abdominal:     General: Bowel sounds are normal.     Palpations: Abdomen is soft.   Musculoskeletal:     Right lower leg: Edema (Lymphedema  present entire legs) present.     Left lower leg: No edema.    Studies Reviewed: Keith Fisher    ECHOCARDIOGRAM COMPLETE 02/25/2024  1. Left ventricular ejection fraction, by estimation, is 55 to 60%. Left ventricular ejection fraction by 3D volume is 58 %. The left ventricle has normal function. The left ventricle has no regional wall motion abnormalities. Left ventricular diastolic parameters are indeterminate. 2. Right ventricular systolic function is normal. The right ventricular size is moderately enlarged. There is mildly elevated  pulmonary artery systolic pressure. The estimated right ventricular systolic pressure is 40.3 mmHg. 3. Left atrial size was mildly dilated. 4. Right atrial size was severely dilated. 5. PISA radius 0.6 cm. The mitral valve is myxomatous. Moderate mitral valve regurgitation. No evidence of mitral stenosis. There is mild holosystolic prolapse of both leaflets of the mitral valve. 6. Tricuspid valve regurgitation is severe. 7. The aortic valve is tricuspid. Aortic valve regurgitation is not visualized. No aortic stenosis is present. 8. Pulmonic valve regurgitation is moderate. 9. The inferior vena cava is dilated in size with >50% respiratory variability, suggesting right atrial pressure of 8 mmHg. 10.  No significant change from 01/31/2022.  EKG:    EKG Interpretation Date/Time:  Monday March 03 2024 13:51:18 EDT Ventricular Rate:  113 PR Interval:    QRS Duration:  86 QT Interval:  348 QTC Calculation: 477 R Axis:   10  Text Interpretation: EKG 03/03/2024: Atrial fibrillation with rapid ventricular response at the rate of 113 bpm, normal axis, poor R wave progression.  Low-voltage complexes.  Compared to 02/12/2023, A-fib with ventricular rate of 95 bpm. Confirmed by Taysia Rivere, Jagadeesh [phone]) on 03/03/2024 2:05:16 PM  EKG 02/12/2023: Atrial fibrillation with controlled ventricular response at the rate of 95 beats minute, normal axis. Poor R progression, cannot exclude anteroseptal infarct old. Low-voltage complexes. Nonspecific T abnormality.   Medications ordered    No orders of the defined types were placed in this encounter.    ASSESSMENT AND PLAN: .      ICD-10-CM   1. Permanent atrial fibrillation (HCC)  I48.21 EKG 12-Lead    Lipid Profile    2. Chronic diastolic heart failure (HCC)  P49.67 Lipid Profile    ECHOCARDIOGRAM COMPLETE    3. Severe tricuspid regurgitation  I07.1 Lipid Profile    ECHOCARDIOGRAM COMPLETE    4. Moderate mitral regurgitation  I34.0 Lipid Profile     ECHOCARDIOGRAM COMPLETE    5. 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 On anticoagulation therapy with Xarelto . Prefers conservative management and is aware of surgical risks. - Continue Xarelto  20 mg once daily with food.  Severe Tricuspid Valve Regurgitation Severe tricuspid valve regurgitation, asymptomatic. Prefers to avoid surgery even if condition worsens, aware of poor outcomes without surgery. He states that he is 78 years old and does not want to prolong life sustaining measures.  - Repeat echocardiogram in one year to monitor valve status.  Moderate Mitral Valve Regurgitation Moderate mitral valve regurgitation, asymptomatic. Prefers to avoid surgery unless condition worsens, aware of potential future surgical need. - Repeat echocardiogram in one year to monitor valve status.  Lymphedema due to failure reassess while in [place] Chronic lymphedema with significant edema. Uses compression stockings, requires further management. Referral to vascular surgery for lymphatic pump evaluation. - Refer to vascular surgery for evaluation and potential prescription of a lymphatic pump. - Also  to provide prescription for medical-grade compression stockings.  Osteoarthritis of Neck Chronic osteoarthritis of the neck with persistent symptoms. Surgical intervention not recommended due to risk of exacerbating lymphedema. Uses a walker for mobility.  Goals of Care Strong preference to avoid surgery, even in life-threatening situations. Willing to continue non-invasive treatments and medications. - Respect his wishes to avoid surgery and focus on conservative management. - Patient's wife is present.  I have reassured her that medical therapy is still possible and will avoid any surgical interventions if possible.   Signed,  Gordy Bergamo, MD,  [facility] 03/04/2024, 8:46 PM [facility] [address] [place], [place] [address] Phone: [phone]. Fax:  [phone]  "

## 2024-03-04 ENCOUNTER — Ambulatory Visit: Payer: Self-pay | Admitting: Cardiology

## 2024-03-04 LAB — LIPID PANEL
Chol/HDL Ratio: 2.8 ratio (ref 0.0–5.0)
Cholesterol, Total: 164 mg/dL (ref 100–199)
HDL: 58 mg/dL (ref >39)
LDL Chol Calc (NIH): 84 mg/dL (ref 0–99)
Triglycerides: 125 mg/dL (ref 0–149)
VLDL Cholesterol Cal: 22 mg/dL (ref 5–40)

## 2024-03-04 NOTE — Progress Notes (Signed)
 Let patient know that the lipid panel is normal

## 2024-03-05 ENCOUNTER — Ambulatory Visit: Payer: Medicare (Managed Care) | Admitting: Orthopedic Surgery

## 2024-03-13 ENCOUNTER — Other Ambulatory Visit: Payer: Self-pay | Admitting: Vascular Surgery

## 2024-03-13 DIAGNOSIS — M7989 Other specified soft tissue disorders: Secondary | ICD-10-CM

## 2024-03-17 ENCOUNTER — Other Ambulatory Visit: Payer: Self-pay | Admitting: Cardiology

## 2024-03-19 ENCOUNTER — Encounter: Payer: Self-pay | Admitting: Orthopedic Surgery

## 2024-03-19 ENCOUNTER — Ambulatory Visit: Payer: Medicare (Managed Care) | Admitting: Orthopedic Surgery

## 2024-03-19 DIAGNOSIS — M1711 Unilateral primary osteoarthritis, right knee: Secondary | ICD-10-CM | POA: Diagnosis not present

## 2024-03-19 DIAGNOSIS — M48061 Spinal stenosis, lumbar region without neurogenic claudication: Secondary | ICD-10-CM

## 2024-03-19 NOTE — Progress Notes (Signed)
 Hey,  Office Visit Note   Patient: Keith Fisher           Date of Birth: Jan 11, 1946           MRN: [id number] Visit Date: 03/19/2024 Requested by: Kip Righter, MD [address] [place],  [place] [address] PCP: Kip Righter, MD  Subjective: Chief Complaint  Patient presents with   Neck - Follow-up, Pain    MRI review   Lower Back - Follow-up, Pain    MRI review    HPI: Keith Fisher is a 78 y.o. male who presents to the office reporting neck and back pain along with leg weakness.  Since he was last seen has had an MRI scan of the cervical spine and lumbar spine.  Cervical spine MRI shows postsurgical changes with no new compression.  Lumbar spine MRI shows severe stenosis likely accounting for his leg weakness.  He is using a walker which is new.  Last injection helped him for 1 month.  Does report bilateral leg numbness and tingling along with some weakness.  Has severe right knee arthritis and is not an operative candidate based on parasitic infection in that right calf and lower extremity..                ROS: All systems reviewed are negative as they relate to the chief complaint within the history of present illness.  Patient denies fevers or chills.  Assessment & Plan: Visit Diagnoses:  1. Spinal stenosis of lumbar region, unspecified whether neurogenic claudication present     Plan: Impression is severe spinal stenosis in the lumbar spine.  We aspirated and injected the right knee with Toradol.  End-stage arthritis is present there.  Needs to see Dr. Onetha to evaluate severe lumbar stenosis.  This is the reason why he is having leg weakness as well as bilateral leg paresthesias.  Not a candidate for knee replacement based on excessive swelling in the right calf and lower leg region.  Follow-Up Instructions: No follow-ups on file.   Orders:  Orders Placed This Encounter  Procedures   Ambulatory referral to Neurosurgery   No orders of the defined types were  placed in this encounter.     Procedures: Large Joint Inj: R knee on 03/19/2024 1:16 PM Indications: diagnostic evaluation, joint swelling and pain Details: 18 G 1.5 in needle, superolateral approach  Arthrogram: No  Medications: 5 mL lidocaine  1 %; 4 mL bupivacaine  0.25 % Outcome: tolerated well, no immediate complications Procedure, treatment alternatives, risks and benefits explained, specific risks discussed. Consent was given by the patient. Immediately prior to procedure a time out was called to verify the correct patient, procedure, equipment, support staff and site/side marked as required. Patient was prepped and draped in the usual sterile fashion.     Toradol injected  Clinical Data: No additional findings.  Objective: Vital Signs: There were no vitals taken for this visit.  Physical Exam:  Constitutional: Patient appears well-developed HEENT:  Head: Normocephalic Eyes:EOM are normal Neck: Normal range of motion Cardiovascular: Normal rate Pulmonary/chest: Effort normal Neurologic: Patient is alert Skin: Skin is warm Psychiatric: Patient has normal mood and affect  Ortho Exam: Ortho exam demonstrates moderate effusion in the right knee.  Extensor mechanism intact.  Has 5 out of 5 ankle dorsiflexion plantarflexion strength with slightly weaker hip flexion bilaterally.  Abduction adduction strength intact.  No groin pain with internal or external rotation of either leg.  Negative clonus.  Reflexes  symmetric 0 to 1+ out of 4 bilateral patella and Achilles.  Specialty Comments:  No specialty comments available.  Imaging: No results found.   PMFS History: Patient Active Problem List   Diagnosis Date Noted   Typical atrial flutter (HCC)    Tricuspid regurgitation 11/22/2021   Persistent atrial fibrillation (HCC) 11/22/2021   Myelopathy (HCC) 05/14/2019   Dyspnea on exertion 03/05/2017   Mitral regurgitation 03/05/2017   Past Medical History:  Diagnosis Date    Arthritis    Dyspnea    Lymphatic filariasis    Mitral valve regurgitation    Pulmonary hypertension (HCC)    Tricuspid regurgitation     No family history on file.  Past Surgical History:  Procedure Laterality Date   BUBBLE STUDY  11/22/2021   Procedure: BUBBLE STUDY;  Surgeon: Ladona Heinz, MD;  Location: [facility] ENDOSCOPY;  Service: Cardiovascular;;   CARDIOVERSION N/A 11/22/2021   Procedure: CARDIOVERSION;  Surgeon: Ladona Heinz, MD;  Location: [facility] ENDOSCOPY;  Service: Cardiovascular;  Laterality: N/A;   CARDIOVERSION N/A 12/27/2021   Procedure: CARDIOVERSION;  Surgeon: Ladona Heinz, MD;  Location: [facility] ENDOSCOPY;  Service: Cardiovascular;  Laterality: N/A;   CARDIOVERSION N/A 01/25/2022   Procedure: CARDIOVERSION;  Surgeon: Elmira Newman PARAS, MD;  Location: [facility] ENDOSCOPY;  Service: Cardiovascular;  Laterality: N/A;   INGUINAL HERNIA REPAIR Right 06/13/2021   Procedure: LAPAROSCOPIC RIGHT INGUINAL HERNIA REPAIR WITH MESH;  Surgeon: Rubin Calamity, MD;  Location: [facility] OR;  Service: General;  Laterality: Right;   INSERTION OF MESH Right 06/13/2021   Procedure: INSERTION OF MESH;  Surgeon: Rubin Calamity, MD;  Location: [facility] OR;  Service: General;  Laterality: Right;   POSTERIOR CERVICAL FUSION/FORAMINOTOMY N/A 05/14/2019   Procedure: Posterior Cervical Fusion and decompression with lateral mass fixation - Cervical Three-Four,Cervical Four-Five, Cervical Five-Six, Cervical Six-Seven.;  Surgeon: Onetha Kuba, MD;  Location: [facility] OR;  Service: Neurosurgery;  Laterality: N/A;  posterior   RIGHT/LEFT HEART CATH AND CORONARY ANGIOGRAPHY N/A 11/22/2021   Procedure: RIGHT/LEFT HEART CATH AND CORONARY ANGIOGRAPHY;  Surgeon: Ladona Heinz, MD;  Location: [facility] INVASIVE CV LAB;  Service: Cardiovascular;  Laterality: N/A;   SHOULDER SURGERY     TEE WITHOUT CARDIOVERSION N/A 03/06/2017   Procedure: TRANSESOPHAGEAL ECHOCARDIOGRAM (TEE);  Surgeon: Ladona Heinz, MD;  Location: [facility] ENDOSCOPY;  Service: Cardiovascular;  Laterality:  N/A;   TEE WITHOUT CARDIOVERSION N/A 11/22/2021   Procedure: TRANSESOPHAGEAL ECHOCARDIOGRAM (TEE);  Surgeon: Ladona Heinz, MD;  Location: [facility] ENDOSCOPY;  Service: Cardiovascular;  Laterality: N/A;   Social History   Occupational History   Not on file  Tobacco Use   Smoking status: Never   Smokeless tobacco: Never  Vaping Use   Vaping status: Never Used  Substance and Sexual Activity   Alcohol use: No   Drug use: No   Sexual activity: Not on file

## 2024-03-21 DIAGNOSIS — K644 Residual hemorrhoidal skin tags: Secondary | ICD-10-CM | POA: Diagnosis not present

## 2024-03-21 DIAGNOSIS — N481 Balanitis: Secondary | ICD-10-CM | POA: Diagnosis not present

## 2024-03-21 DIAGNOSIS — L0889 Other specified local infections of the skin and subcutaneous tissue: Secondary | ICD-10-CM | POA: Diagnosis not present

## 2024-03-22 MED ORDER — BUPIVACAINE HCL 0.25 % IJ SOLN
4.0000 mL | INTRAMUSCULAR | Status: AC | PRN
  Administered 2024-03-19: 4 mL via INTRA_ARTICULAR

## 2024-03-22 MED ORDER — LIDOCAINE HCL 1 % IJ SOLN
5.0000 mL | INTRAMUSCULAR | Status: AC | PRN
  Administered 2024-03-19: 5 mL

## 2024-03-25 ENCOUNTER — Ambulatory Visit (INDEPENDENT_AMBULATORY_CARE_PROVIDER_SITE_OTHER): Payer: Medicare (Managed Care) | Admitting: Physical Medicine and Rehabilitation

## 2024-03-25 ENCOUNTER — Encounter: Payer: Self-pay | Admitting: Physical Medicine and Rehabilitation

## 2024-03-25 DIAGNOSIS — M961 Postlaminectomy syndrome, not elsewhere classified: Secondary | ICD-10-CM | POA: Diagnosis not present

## 2024-03-25 DIAGNOSIS — M48062 Spinal stenosis, lumbar region with neurogenic claudication: Secondary | ICD-10-CM | POA: Diagnosis not present

## 2024-03-25 DIAGNOSIS — M5416 Radiculopathy, lumbar region: Secondary | ICD-10-CM | POA: Diagnosis not present

## 2024-03-25 DIAGNOSIS — M4712 Other spondylosis with myelopathy, cervical region: Secondary | ICD-10-CM

## 2024-03-25 DIAGNOSIS — M5412 Radiculopathy, cervical region: Secondary | ICD-10-CM | POA: Diagnosis not present

## 2024-03-25 NOTE — Progress Notes (Signed)
 Pain Scale   Average Pain 8 Patient advising he ha lower back pain and radiates to his right leg, patient advising his neck also hurts at times( fusion, by Dr. Roseann at [facility]  3 years per patient)  Patient here for MRI results       +Driver, -BT, -Dye Allergies.

## 2024-03-25 NOTE — Progress Notes (Signed)
 Keith Fisher - 78 y.o. male MRN [id number]  Date of birth: 1945-10-16  Office Visit Note: Visit Date: 03/25/2024 PCP: Kip Righter, MD Referred by: Kip Righter, MD  Subjective: Chief Complaint  Patient presents with   Lower Back - Pain   HPI: Keith Fisher is a 78 y.o. male who comes in today per the request of Dr. Glendia Hutchinson for evaluation of (2) chronic issues bilateral lower back pain radiating down both legs and bilateral neck pain radiating down left arm. Patient is somewhat of a poor historian.   Lower back pain ongoing for several years, worsens with walking. He describes pain as sore and cramping sensation, currently rates as 8 out of 10. Feel his left lower extremity is weak, especially with walking. Some relief of pain with home exercise regimen, rest and use of medications. He does take Gabapentin  daily. History of formal physical therapy with minimal relief. He is fairly active, he does attend [employer] multiple times a week. Recent lumbar MRI imaging multifactorial degenerative changes at L3-4 with resultant severe spinal stenosis, with severe right and moderate left L3 foraminal narrowing. He does report history of lumbar epidural steroid injections many years ago with some relief of pain. No history of lumbar surgery.   Also reports chronic, worsening and severe bilateral neck pain, intermittent radiation of pain down left arm. Numbness/tingling to left upper extremity and both hands. States left arm feels very weak. Pain ongoing for several years, worsens with movement and activity, describes as sore and sharp, currently rates as 8 out of 10. Recent cervical MRI imaging shows postoperative changes from prior posterior decompression with fusion at C3 through C7. No residual spinal stenosis. Notable findings include severe left C5 foraminal stenosis, moderate right C7 foraminal narrowing, with moderate left C8 foraminal stenosis. Chronic myelomalacia involving the bilateral cord  at C6-C7 with associated cord atrophy, stable. History of posterior cervical fusion C3-C4, C4-C5, C5-C6 and C6-C7 with Dr. Arley Helling in 2020. No history of cervical injection.   He is currently using rolling walker to assist with ambulation. He does exhibit myelopathic symptoms during our visit in the office today. He has difficulty with balance and coordination. Reports multiple falls over the last several months.           Review of Systems  Musculoskeletal:  Positive for back pain, myalgias and neck pain.  Neurological:  Positive for tingling and weakness.  All other systems reviewed and are negative.  Otherwise per HPI.  Assessment & Plan: Visit Diagnoses:    ICD-10-CM   1. Radiculopathy, cervical region  M54.12 Ambulatory referral to Neurosurgery    2. Post laminectomy syndrome  M96.1 Ambulatory referral to Neurosurgery    3. Myelopathy due to cervical spondylosis  M47.12 Ambulatory referral to Neurosurgery    4. Lumbar radiculopathy  M54.16 Ambulatory referral to Physical Medicine Rehab    5. Spinal stenosis of lumbar region with neurogenic claudication  M48.062 Ambulatory referral to Physical Medicine Rehab       Plan: Findings:  1. Chronic bilateral lower back pain radiating down both legs. Patient continues to have severe pain despite good conservative therapies such as formal physical therapy, home exercise regimen, rest and use of medications. Patients clinical presentation and exam are consistent with neurogenic claudication as a result of spinal canal stenosis. There is severe spinal canal stenosis noted at the level of L3-L4. We discussed treatment plan in detail today. Next step is to perform diagnostic and hopefully therapeutic  bilateral L3 transforaminal epidural steroid injection under fluoroscopic guidance. He does not need to discontinue Xarelto . Of note, he is very unsteady on his feet today, difficulty with balance and coordination. He is using rolling walker  however required assistance walking out today. This is likely due to cervical myelopathy. We will get him back quickly for lumbar epidural steroid injection.   2. Chronic bilateral neck pain, intermittent radiation of pain down left arm. Paresthesias to left arm. He continues to experience symptoms despite good conservative therapies such as formal physical therapy, home exercise regimen, rest and use of medications. Patients clinical presentation and exam are concerning today, he does exhibit symptoms of cervical myelopathy. As I stated earlier, difficulty with balance and coordination. He is also having difficulty with fine motor skills. He does have atrophy noted to hands bilaterally. History of posterior cervical fusion with Dr. Onetha in 2020. We discussed treatment options today. Dr. Eldonna at bedside as well. Would recommend patient follow up with Dr. Onetha to discuss options, Dr. Eichman may also have insight into possible injection that can be done. At this point, we do not have much to offer in the way of interventional procedures. I did go ahead and place a STAT referral back to Dr. Onetha.     Meds & Orders: No orders of the defined types were placed in this encounter.   Orders Placed This Encounter  Procedures   Ambulatory referral to Physical Medicine Rehab   Ambulatory referral to Neurosurgery    Follow-up: Return for Bilateral L3 transforaminal epidural steroid injection.   Procedures: No procedures performed      Clinical History: No specialty comments available.   He reports that he has never smoked. He has never used smokeless tobacco. No results for input(s): HGBA1C, LABURIC in the last 8760 hours.  Objective:  VS:  HT:    WT:   BMI:     BP:   HR: bpm  TEMP: ( )  RESP:  Physical Exam Vitals and nursing note reviewed.  HENT:     Head: Normocephalic and atraumatic.     Right Ear: External ear normal.     Left Ear: External ear normal.     Nose: Nose normal.      Mouth/Throat:     Mouth: Mucous membranes are moist.  Eyes:     Pupils: Pupils are equal, round, and reactive to light.  Cardiovascular:     Rate and Rhythm: Normal rate.     Pulses: Normal pulses.  Pulmonary:     Effort: Pulmonary effort is normal.  Abdominal:     General: Abdomen is flat. There is no distension.  Musculoskeletal:        General: Tenderness present.     Cervical back: Tenderness present.     Comments: No discomfort noted with flexion, extension and side-to-side rotation. Patient has good strength in the upper extremities including 5 out of 5 strength in wrist extension, long finger flexion and APB. Right shoulder range of motion is full bilaterally without any sign of impingement. Decreased range of motion and pain upon abduction of left shoulder. Significant atrophy noted to bilateral hands, left greater than right, most significant atrophy between web spacing of thumb and first finger. Sensation intact bilaterally. Negative Hoffman's sign. Negative Spurling's sign.   Patient is slow to rise from seated position to standing. Good lumbar range of motion. No pain noted with facet loading. 5/5 strength noted with bilateral hip flexion, knee flexion/extension, ankle dorsiflexion/plantarflexion and  EHL. No clonus noted bilaterally. No pain upon palpation of greater trochanters. No pain with internal/external rotation of bilateral hips. Sensation intact bilaterally. Negative slump test bilaterally. Ambulates with rolling walker, very unsteady on his feet.           Skin:    General: Skin is warm and dry.     Capillary Refill: Capillary refill takes less than 2 seconds.  Neurological:     Mental Status: He is alert and oriented to person, place, and time.     Motor: Weakness present.     Gait: Gait abnormal.  Psychiatric:        Mood and Affect: Mood normal.        Behavior: Behavior normal.     Ortho Exam  Imaging: No results found.  Past  Medical/Family/Surgical/Social History: Medications & Allergies reviewed per EMR, new medications updated. Patient Active Problem List   Diagnosis Date Noted   Typical atrial flutter (HCC)    Tricuspid regurgitation 11/22/2021   Persistent atrial fibrillation (HCC) 11/22/2021   Myelopathy (HCC) 05/14/2019   Dyspnea on exertion 03/05/2017   Mitral regurgitation 03/05/2017   Past Medical History:  Diagnosis Date   Arthritis    Dyspnea    Lymphatic filariasis    Mitral valve regurgitation    Pulmonary hypertension (HCC)    Tricuspid regurgitation    History reviewed. No pertinent family history. Past Surgical History:  Procedure Laterality Date   BUBBLE STUDY  11/22/2021   Procedure: BUBBLE STUDY;  Surgeon: Ladona Heinz, MD;  Location: [facility] ENDOSCOPY;  Service: Cardiovascular;;   CARDIOVERSION N/A 11/22/2021   Procedure: CARDIOVERSION;  Surgeon: Ladona Heinz, MD;  Location: [facility] ENDOSCOPY;  Service: Cardiovascular;  Laterality: N/A;   CARDIOVERSION N/A 12/27/2021   Procedure: CARDIOVERSION;  Surgeon: Ladona Heinz, MD;  Location: [facility] ENDOSCOPY;  Service: Cardiovascular;  Laterality: N/A;   CARDIOVERSION N/A 01/25/2022   Procedure: CARDIOVERSION;  Surgeon: Elmira Newman PARAS, MD;  Location: [facility] ENDOSCOPY;  Service: Cardiovascular;  Laterality: N/A;   INGUINAL HERNIA REPAIR Right 06/13/2021   Procedure: LAPAROSCOPIC RIGHT INGUINAL HERNIA REPAIR WITH MESH;  Surgeon: Rubin Calamity, MD;  Location: [facility] OR;  Service: General;  Laterality: Right;   INSERTION OF MESH Right 06/13/2021   Procedure: INSERTION OF MESH;  Surgeon: Rubin Calamity, MD;  Location: [facility] OR;  Service: General;  Laterality: Right;   POSTERIOR CERVICAL FUSION/FORAMINOTOMY N/A 05/14/2019   Procedure: Posterior Cervical Fusion and decompression with lateral mass fixation - Cervical Three-Four,Cervical Four-Five, Cervical Five-Six, Cervical Six-Seven.;  Surgeon: Onetha Kuba, MD;  Location[facility] OR;  Service: Neurosurgery;  Laterality: N/A;   posterior   RIGHT/LEFT HEART CATH AND CORONARY ANGIOGRAPHY N/A 11/22/2021   Procedure: RIGHT/LEFT HEART CATH AND CORONARY ANGIOGRAPHY;  Surgeon: Ladona Heinz, MD;  Location: [facility] INVASIVE CV LAB;  Service: Cardiovascular;  Laterality: N/A;   SHOULDER SURGERY     TEE WITHOUT CARDIOVERSION N/A 03/06/2017   Procedure: TRANSESOPHAGEAL ECHOCARDIOGRAM (TEE);  Surgeon: Ladona Heinz, MD;  Location: [facility] ENDOSCOPY;  Service: Cardiovascular;  Laterality: N/A;   TEE WITHOUT CARDIOVERSION N/A 11/22/2021   Procedure: TRANSESOPHAGEAL ECHOCARDIOGRAM (TEE);  Surgeon: Ladona Heinz, MD;  Location: [facility] ENDOSCOPY;  Service: Cardiovascular;  Laterality: N/A;   Social History   Occupational History   Not on file  Tobacco Use   Smoking status: Never   Smokeless tobacco: Never  Vaping Use   Vaping status: Never Used  Substance and Sexual Activity   Alcohol use: No   Drug use: No   Sexual activity:  Not on file

## 2024-03-27 ENCOUNTER — Ambulatory Visit (HOSPITAL_COMMUNITY): Payer: Medicare (Managed Care)

## 2024-04-03 ENCOUNTER — Telehealth: Payer: Self-pay

## 2024-04-03 DIAGNOSIS — Z6827 Body mass index (BMI) 27.0-27.9, adult: Secondary | ICD-10-CM | POA: Diagnosis not present

## 2024-04-03 DIAGNOSIS — M48062 Spinal stenosis, lumbar region with neurogenic claudication: Secondary | ICD-10-CM | POA: Diagnosis not present

## 2024-04-03 DIAGNOSIS — G959 Disease of spinal cord, unspecified: Secondary | ICD-10-CM | POA: Diagnosis not present

## 2024-04-03 NOTE — Telephone Encounter (Signed)
   Pre-operative Risk Assessment    Patient Name: Keith Fisher  DOB: 1945/11/09 MRN: [id number]   Date of last office visit: 03/03/24 GORDY BERGAMO, MD Date of next office visit: NONE   Request for Surgical Clearance    Procedure:  BILATERAL L5 TFESI  Date of Surgery:  Clearance TBD                                Surgeon:  DR DEATRICE MANUS Surgeon's Group or Practice Name:  [facility] Phone number:  [phone] Fax number:  [phone]   Type of Clearance Requested:   - Medical  - Pharmacy:  Hold Rivaroxaban  (Xarelto ) 3 DAYS PRIOR, RESUME DAY AFTER   Type of Anesthesia:  Not Indicated   Additional requests/questions:    Signed, Lucie DELENA Ku   04/03/2024, 5:00 PM

## 2024-04-04 ENCOUNTER — Ambulatory Visit (HOSPITAL_COMMUNITY)
Admission: RE | Admit: 2024-04-04 | Discharge: 2024-04-04 | Disposition: A | Discharge status: Home / Self Care | Payer: Medicare (Managed Care) | Source: Ambulatory Visit | Attending: Vascular Surgery | Admitting: Vascular Surgery

## 2024-04-04 DIAGNOSIS — M7989 Other specified soft tissue disorders: Secondary | ICD-10-CM | POA: Diagnosis not present

## 2024-04-04 NOTE — Telephone Encounter (Signed)
 Pharmacy please advise on holding Xarelto  for 3 days prior to  BILATERAL L5 TFESI  scheduled for TBD. Thank you.

## 2024-04-06 NOTE — Progress Notes (Unsigned)
 [facility]  PATIENT: Keith Fisher DOB: 1945-12-17  REFERRING DOCTOR OR PCP: Anthony Hint, FNP; [employer] MD SOURCE: Patient, notes from primary care, imaging and lab reports, MRI images personally reviewed.  _________________________________   HISTORICAL  CHIEF COMPLAINT:  Chief Complaint  Patient presents with   RM10/NEUROPATHY    Pt is here with his Wife. Pt's wife states that pt has had neuropathy for 20 years plus. Pt states that his feet burn constantly. Pt's wife states pt has been having balance issues which causes him to fall due to him not feeling his feet. Pt states that his legs sometimes burn and tingle and will jump when he is laying down. Pt states that his hands will be numb. Pt states that he has weakness in his left arm and leg. Pt states that he has fallen yesterday and hurt his shoulder.     HISTORY OF PRESENT ILLNESS:  I had the pleasure of seeing your patient, Keith Fisher, at [facility] for neurologic consultation regarding his worsening gait and numbness.  He is a 78 year old man who had the onset of gait disturbance and numbness in 2020.  He was found to have cervical stenosis with myelopathy.    He had surgery with posterior fusion form C3 to C7 (Dr. Onetha).  He improved some but over the past year he has had worsening of the numbness and gait.    He states that he had only minimal numbness but no difficulty with gait before symptoms worsened and 2020 and he was physically active at that time.  He also has numbness in his legs.  He denies much leg pain but has lower back pain.    He has some urinary urgency but no incontinence.     He denies neurogenic claudication symptoms, though does not walk long distances due to reduced balance and gait.  He has a long history of lymphedema in the right leg and also has right knee DJD causing pain.  He does compression treatments for the lymphedema.      MRI of the cervical spine  02/28/2024 showed fusion from C3-C7 (posterior) and signal changes within the spinal cord adjacent to C6-C7 consistent with a history of compressive myelopathy.  This study is essentially unchanged compared to the postoperative MRI from 10/16/2019.  The preoperative MRI from 04/13/2019  Imaging personally reviewed: MRI of the cervical spine 02/28/2024 showed posterior fusion from C3 to C7.  There is abnormal signal in the spinal cord associated with myelomalacia at C6-C7.  This study is essentially unchanged compared to an MRI from February 2021.  The MRI from August 2020 (preoperative) showed multilevel severe spinal stenosis.  There is cord atrophy at C6-C7 with central cord hyperintensity with a syncopize appearance consistent with myelomalacia.  Of note, MRI from 2017 also showed the changes within the spinal cord.  The lumbar spine 02/28/2024 showed: At T12-L1, there was borderline spinal stenosis and mild foraminal and lateral recess stenosis but no nerve root compression.  At L1-L2, there is mild spinal stenosis and left greater than right foraminal and lateral recess stenosis but no definite nerve root compression.  At L2-L3, there is facet hypertrophy and disc bulging causing mild foraminal narrowing and mild to moderate left lateral recess stenosis but no definite nerve root compression.  At L3-L4, there is severe spinal stenosis (AP diameter 5.7 mm), right greater than left foraminal narrowing and left greater than right lateral recess stenosis.  This could  affect the L4 or L5 nerve roots.    L4-L5: There is mild spinal stenosis and right disc protrusion causing moderate right greater than left foraminal narrowing and moderately severe right lateral recess stenosis could affect the right L5 nerve root.  At L5-S1, there are degenerative changes but no spinal stenosis or nerve root compression.   Compared to the MRI from 10/16/2019, the spinal stenosis at L3-L4 appears to have progressed.  REVIEW OF  SYSTEMS: Constitutional: No fevers, chills, sweats, or change in appetite Eyes: No visual changes, double vision, eye pain Ear, nose and throat: No hearing loss, ear pain, nasal congestion, sore throat Cardiovascular: No chest pain, palpitations Respiratory:  No shortness of breath at rest or with exertion.   No wheezes GastrointestinaI: No nausea, vomiting, diarrhea, abdominal pain, fecal incontinence Genitourinary:  No dysuria, urinary retention or frequency.  No nocturia. Musculoskeletal:  No neck pain, back pain Integumentary: No rash, pruritus, skin lesions Neurological: as above Psychiatric: No depression at this time.  No anxiety Endocrine: No palpitations, diaphoresis, change in appetite, change in weigh or increased thirst Hematologic/Lymphatic:  No anemia, purpura, petechiae. Allergic/Immunologic: No itchy/runny eyes, nasal congestion, recent allergic reactions, rashes  ALLERGIES: Allergies  Allergen Reactions   Penicillins    Digoxin  And Related Anxiety    A combination of metoprolol  and digoxin  caused anxiety   Metoprolol  Anxiety    A combination of metoprolol  and digoxin  caused anxiety    HOME MEDICATIONS:  Current Outpatient Medications:    acetaminophen -codeine  (TYLENOL  #3) 300-30 MG tablet, Take 1 tablet by mouth every 8 (eight) hours as needed for moderate pain., Disp: 30 tablet, Rfl: 0   furosemide  (LASIX ) 40 MG tablet, TAKE 1 TABLET BY MOUTH ONCE DAILY AS NEEDED FOR  FLUID  OR  EDEMA, Disp: 90 tablet, Rfl: 3   gabapentin  (NEURONTIN ) 600 MG tablet, Take 600 mg by mouth 3 (three) times daily as needed., Disp: , Rfl:    XARELTO  20 MG TABS tablet, TAKE 1 TABLET(20 MG) BY MOUTH DAILY WITH SUPPER, Disp: 90 tablet, Rfl: 3   potassium chloride  SA (KLOR-CON  M20) 20 MEQ tablet, Take 2 tablets (40 mEq total) by mouth daily as needed (with Lasix ). (Patient not taking: Reported on 04/08/2024), Disp: 90 tablet, Rfl: 3   traMADol  (ULTRAM ) 50 MG tablet, Take 1 tablet (50 mg total)  by mouth every 6 (six) hours as needed. (Patient not taking: Reported on 04/08/2024), Disp: 60 tablet, Rfl: 2  PAST MEDICAL HISTORY: Past Medical History:  Diagnosis Date   Arthritis    Dyspnea    Lymphatic filariasis    Mitral valve regurgitation    Pulmonary hypertension (HCC)    Tricuspid regurgitation     PAST SURGICAL HISTORY: Past Surgical History:  Procedure Laterality Date   BUBBLE STUDY  11/22/2021   Procedure: BUBBLE STUDY;  Surgeon: Ladona Heinz, MD;  Location: [facility] ENDOSCOPY;  Service: Cardiovascular;;   CARDIOVERSION N/A 11/22/2021   Procedure: CARDIOVERSION;  Surgeon: Ladona Heinz, MD;  Location: [facility] ENDOSCOPY;  Service: Cardiovascular;  Laterality: N/A;   CARDIOVERSION N/A 12/27/2021   Procedure: CARDIOVERSION;  Surgeon: Ladona Heinz, MD;  Location: [facility] ENDOSCOPY;  Service: Cardiovascular;  Laterality: N/A;   CARDIOVERSION N/A 01/25/2022   Procedure: CARDIOVERSION;  Surgeon: Elmira Newman PARAS, MD;  Location: [facility] ENDOSCOPY;  Service: Cardiovascular;  Laterality: N/A;   INGUINAL HERNIA REPAIR Right 06/13/2021   Procedure: LAPAROSCOPIC RIGHT INGUINAL HERNIA REPAIR WITH MESH;  Surgeon: Rubin Calamity, MD;  Location: [facility] OR;  Service: General;  Laterality:  Right;   INSERTION OF MESH Right 06/13/2021   Procedure: INSERTION OF MESH;  Surgeon: Rubin Calamity, MD;  Location: [facility] OR;  Service: General;  Laterality: Right;   POSTERIOR CERVICAL FUSION/FORAMINOTOMY N/A 05/14/2019   Procedure: Posterior Cervical Fusion and decompression with lateral mass fixation - Cervical Three-Four,Cervical Four-Five, Cervical Five-Six, Cervical Six-Seven.;  Surgeon: Onetha Kuba, MD;  Location: [facility] OR;  Service: Neurosurgery;  Laterality: N/A;  posterior   RIGHT/LEFT HEART CATH AND CORONARY ANGIOGRAPHY N/A 11/22/2021   Procedure: RIGHT/LEFT HEART CATH AND CORONARY ANGIOGRAPHY;  Surgeon: Ladona Heinz, MD;  Location: [facility] INVASIVE CV LAB;  Service: Cardiovascular;  Laterality: N/A;   SHOULDER SURGERY     TEE WITHOUT  CARDIOVERSION N/A 03/06/2017   Procedure: TRANSESOPHAGEAL ECHOCARDIOGRAM (TEE);  Surgeon: Ladona Heinz, MD;  Location: [facility] ENDOSCOPY;  Service: Cardiovascular;  Laterality: N/A;   TEE WITHOUT CARDIOVERSION N/A 11/22/2021   Procedure: TRANSESOPHAGEAL ECHOCARDIOGRAM (TEE);  Surgeon: Ladona Heinz, MD;  Location: [facility] ENDOSCOPY;  Service: Cardiovascular;  Laterality: N/A;    FAMILY HISTORY: Family History  Problem Relation Age of Onset   Neuropathy Neg Hx     SOCIAL HISTORY: Social History   Socioeconomic History   Marital status: Married    Spouse name: Not on file   Number of children: 1   Years of education: Not on file   Highest education level: Not on file  Occupational History   Not on file  Tobacco Use   Smoking status: Never   Smokeless tobacco: Never  Vaping Use   Vaping status: Never Used  Substance and Sexual Activity   Alcohol use: No   Drug use: No   Sexual activity: Not on file  Other Topics Concern   Not on file  Social History Narrative   Not on file   Social Drivers of Health   Financial Resource Strain: Not on file  Food Insecurity: Not on file  Transportation Needs: Not on file  Physical Activity: Not on file  Stress: Not on file  Social Connections: Not on file  Intimate Partner Violence: Not on file       PHYSICAL EXAM  Vitals:   04/08/24 1408  BP: 134/87  Pulse: 89  SpO2: 98%  Weight: 192 lb (87.1 kg)  Height: 5' 10 (1.778 m)    Body mass index is 27.55 kg/m.   General: The patient is well-developed and well-nourished and in no acute distress  HEENT:  Head is [place]/AT.  Sclera are anicteric.    Neck: No carotid bruits are noted.  The neck is nontender.  Cardiovascular: The heart has a regular rate and rhythm with a normal S1 and S2. There were no murmurs, gallops or rubs.    Skin/extremities: Severe right leg lymphedema..  Musculoskeletal:  Back is nontender to palpation.  Neurologic Exam  Mental status: The patient is alert and  oriented x 3 at the time of the examination. The patient has apparent normal recent and remote memory, with an apparently normal attention span and concentration ability.   Speech is normal.  Cranial nerves: Extraocular movements are full. Facial symmetry is present. There is good facial sensation to soft touch bilaterally.Facial strength is normal.  Trapezius and sternocleidomastoid strength is normal. No dysarthria is noted. . No obvious hearing deficits are noted.  Motor: There is atrophy in the intrinsic hand muscles of both arms, right worse than left.  Corresponding reduced strength in the intrinsic hand muscles.  Reduced triceps strength as well.  In the legs, strength  was 4 to 4+/5 in most of the muscles tested  Sensory: He reported normal sensation to vibration and touch in the hands.  There was no absent vibration sensation at the ankles and absent sensation at the toes..  Coordination: Cerebellar testing reveals good finger-nose-finger and heel-to-shin bilaterally.  Gait and station: He needs to use his arms to arise from a seated position.  He has bent forward with stance, more so when he takes steps.  He cannot tandem walk.  Romberg was borderline..   Reflexes: Deep tendon reflexes are trace in the arms and absent in the knees and ankles..   Plantar responses are flexor.    DIAGNOSTIC DATA (LABS, IMAGING, TESTING) - I reviewed patient records, labs, notes, testing and imaging myself where available.  Lab Results  Component Value Date   WBC 6.7 12/22/2021   HGB 12.2 (L) 01/25/2022   HCT 36.0 (L) 01/25/2022   MCV 89 12/22/2021   PLT 228 12/22/2021      Component Value Date/Time   NA 138 01/30/2022 0826   K 4.2 01/30/2022 0826   CL 103 01/30/2022 0826   CO2 20 01/30/2022 0826   GLUCOSE 127 (H) 01/30/2022 0826   GLUCOSE 115 (H) 01/25/2022 1102   BUN 18 01/30/2022 0826   CREATININE 1.33 (H) 01/30/2022 0826   CALCIUM 8.7 01/30/2022 0826   PROT 6.4 11/17/2021 1321    ALBUMIN 4.4 11/17/2021 1321   AST 29 11/17/2021 1321   ALT 20 11/17/2021 1321   ALKPHOS 96 11/17/2021 1321   BILITOT 0.4 11/17/2021 1321   GFRNONAA >60 06/13/2021 0839   GFRAA >60 05/07/2019 1122   Lab Results  Component Value Date   CHOL 164 03/03/2024   HDL 58 03/03/2024   LDLCALC 84 03/03/2024   TRIG 125 03/03/2024   CHOLHDL 2.8 03/03/2024      ASSESSMENT AND PLAN  Myelopathy (HCC) - Plan: Ambulatory referral to Neurosurgery  Abnormal gait  Spinal stenosis of lumbar region without neurogenic claudication - Plan: Ambulatory referral to Neurosurgery  Arthritis of right knee  Numbness - Plan: Vitamin B12, Multiple Myeloma Panel (SPEP&IFE w/QIG)  Neuropathy - Plan: Vitamin B12, Multiple Myeloma Panel (SPEP&IFE w/QIG)   In summary, he is a 78 year old man with a history of cervical myelopathy status post multilevel posterior fusion who has had worsening gait and leg numbness as well as back pain over the past year.  From before his surgery, he has myelopathic signal at C6-C7.  Additionally, he has multilevel lumbar spinal stenosis, severe at L3-L4, with some progression compared to earlier MRIs.  Most likely, his gait disturbance is due to sequela of his cervical myelopathy combined with worsening lumbar spinal stenosis.  Other medical issues including lymphedema and knee arthritis likely also contribute.  He is very numb to vibration sensation at the ankles and has no vibration sensation at the toes.  Although this could be due to the cervical myelopathy, we need to also check vitamin B12 and SPEP/IEF to make sure that there is not an alternative explanation.  As I believe that some of the gait disturbance is coming from the lumbar spine, we will refer to neurosurgery for an opinion regarding decompression surgery.  He feels that the pain is manageable.  He will continue gabapentin .  No follow-up appointment was made but they should call for significant new or worsening neurologic  symptoms.    Coretha Creswell A. Vear, MD, Teola RENO 04/08/2024, 3:13 PM Certified in Neurology, Clinical Neurophysiology, Sleep Medicine and Neuroimaging  [facility] [address] [place], [place] [address] [phone]

## 2024-04-07 NOTE — Telephone Encounter (Signed)
 Patient with diagnosis of afib on Xarelto  for anticoagulation.    Procedure: BILATERAL L5 TFESI  Date of procedure: TBD   CHA2DS2-VASc Score = 3   This indicates a 3.2% annual risk of stroke. [name] score is based upon: CHF History: 1 HTN History: 0 Diabetes History: 0 Stroke History: 0 Vascular Disease History: 0 Age Score: 2 Gender Score: 0      CrCl 54.9 ml/min Platelet count 252  Patient has not had an Afib/aflutter ablation within the last 3 months or DCCV within the last 30 days  Per office protocol, patient can hold Xarelto  for 3 days prior to procedure.    **This guidance is not considered finalized until pre-operative APP has relayed final recommendations.**

## 2024-04-08 ENCOUNTER — Ambulatory Visit: Payer: Medicare (Managed Care) | Admitting: Neurology

## 2024-04-08 ENCOUNTER — Encounter: Payer: Self-pay | Admitting: Neurology

## 2024-04-08 VITALS — BP 134/87 | HR 89 | Ht 70.0 in | Wt 192.0 lb

## 2024-04-08 DIAGNOSIS — M48061 Spinal stenosis, lumbar region without neurogenic claudication: Secondary | ICD-10-CM

## 2024-04-08 DIAGNOSIS — M1711 Unilateral primary osteoarthritis, right knee: Secondary | ICD-10-CM | POA: Diagnosis not present

## 2024-04-08 DIAGNOSIS — G629 Polyneuropathy, unspecified: Secondary | ICD-10-CM

## 2024-04-08 DIAGNOSIS — R200 Anesthesia of skin: Secondary | ICD-10-CM

## 2024-04-08 DIAGNOSIS — R269 Unspecified abnormalities of gait and mobility: Secondary | ICD-10-CM | POA: Diagnosis not present

## 2024-04-08 DIAGNOSIS — G959 Disease of spinal cord, unspecified: Secondary | ICD-10-CM | POA: Diagnosis not present

## 2024-04-08 DIAGNOSIS — E119 Type 2 diabetes mellitus without complications: Secondary | ICD-10-CM | POA: Insufficient documentation

## 2024-04-08 DIAGNOSIS — R399 Unspecified symptoms and signs involving the genitourinary system: Secondary | ICD-10-CM | POA: Insufficient documentation

## 2024-04-08 DIAGNOSIS — M7989 Other specified soft tissue disorders: Secondary | ICD-10-CM | POA: Insufficient documentation

## 2024-04-08 NOTE — Telephone Encounter (Signed)
 Dr. Ladona,  You saw this patient on 03/03/2024. Per office protocol, will you please comment on medical clearance for bilateral L5 TFESI? Pharmacy has recommending holding Xarelto  for 3 days prior to injection.   Please route your response to P CV DIV Preop. I will communicate with requesting office once you have given recommendations.   Thank you!  Barnie Hila, NP

## 2024-04-09 ENCOUNTER — Telehealth: Payer: Self-pay | Admitting: Neurology

## 2024-04-09 NOTE — Telephone Encounter (Signed)
 Referral for neurosurgery sent through EPIC to [facility]. Phone [phone], Fax: [phone]

## 2024-04-09 NOTE — Telephone Encounter (Signed)
 Okay to proceed with holding Xarelto  for 3 days and restart when appropriate for upcoming Bilateral L5 Transforaminal Epidural Steroid Injection (TFESI)

## 2024-04-09 NOTE — Telephone Encounter (Signed)
   Name: Keith Fisher  DOB: 07/15/1946  MRN: [id number]   Primary Cardiologist: Gordy Bergamo, MD  Chart reviewed as part of pre-operative protocol coverage. Keith Fisher was last seen on 03/03/2024 by Dr. Bergamo.  Per Dr. Bergamo [place] to proceed with holding Xarelto  for 3 days and restart when appropriate for upcoming Bilateral L5 Transforaminal Epidural Steroid Injection (TFESI).  Therefore, based on ACC/AHA guidelines, the patient would be an acceptable risk for the planned procedure without further cardiovascular testing.   Per Pharm D, patient has not had an Afib/aflutter ablation within the last 3 months or DCCV within the last 30 days. Patient may hold Xarelto  for 3 days prior to procedure.    I will route this recommendation to the requesting party via Epic fax function and remove from pre-op pool. Please call with questions.  Barnie Hila, NP 04/09/2024, 7:13 AM

## 2024-04-10 ENCOUNTER — Ambulatory Visit: Payer: Self-pay | Admitting: Neurology

## 2024-04-10 ENCOUNTER — Telehealth: Payer: Self-pay | Admitting: Physical Medicine and Rehabilitation

## 2024-04-10 LAB — MULTIPLE MYELOMA PANEL, SERUM
Albumin SerPl Elph-Mcnc: 3.5 g/dL (ref 2.9–4.4)
Albumin/Glob SerPl: 1.4 (ref 0.7–1.7)
Alpha 1: 0.2 g/dL (ref 0.0–0.4)
Alpha2 Glob SerPl Elph-Mcnc: 0.6 g/dL (ref 0.4–1.0)
B-Globulin SerPl Elph-Mcnc: 0.9 g/dL (ref 0.7–1.3)
Gamma Glob SerPl Elph-Mcnc: 0.8 g/dL (ref 0.4–1.8)
Globulin, Total: 2.6 g/dL (ref 2.2–3.9)
IgA/Immunoglobulin A, Serum: 158 mg/dL (ref 61–437)
IgG (Immunoglobin G), Serum: 806 mg/dL (ref 603–1613)
IgM (Immunoglobulin M), Srm: 64 mg/dL (ref 15–143)
Total Protein: 6.1 g/dL (ref 6.0–8.5)

## 2024-04-10 LAB — VITAMIN B12: Vitamin B-12: 261 pg/mL (ref 232–1245)

## 2024-04-10 NOTE — Telephone Encounter (Signed)
 Patient son called and ask if you can him concerning his father about the injection. He took some medication that he wasn't suppose to take before the injection. [phone]

## 2024-04-21 ENCOUNTER — Other Ambulatory Visit: Payer: Self-pay

## 2024-04-21 ENCOUNTER — Ambulatory Visit: Payer: Medicare (Managed Care) | Admitting: Physical Medicine and Rehabilitation

## 2024-04-21 VITALS — BP 134/79 | HR 89

## 2024-04-21 DIAGNOSIS — M5416 Radiculopathy, lumbar region: Secondary | ICD-10-CM | POA: Diagnosis not present

## 2024-04-21 MED ORDER — METHYLPREDNISOLONE ACETATE 40 MG/ML IJ SUSP
40.0000 mg | Freq: Once | INTRAMUSCULAR | Status: AC
  Administered 2024-04-21: 40 mg

## 2024-04-21 NOTE — Patient Instructions (Signed)

## 2024-04-21 NOTE — Progress Notes (Signed)
 Pain Scale   Average Pain 8 Patient advising he has chronic lower back pain that radiates to his right leg and knee. Patient advising he has no relief        +Driver, -BT, -Dye Allergies.

## 2024-04-27 NOTE — Progress Notes (Signed)
 Keith Fisher - 78 y.o. male MRN [id number]  Date of birth: 1946/08/24  Office Visit Note: Visit Date: 04/21/2024 PCP: Kip Righter, MD Referred by: Kip Righter, MD  Subjective: Chief Complaint  Patient presents with   Lower Back - Pain   HPI:  Keith Fisher is a 78 y.o. male who comes in today at the request of Duwaine Pouch, FNP for planned Bilateral L3-4 Lumbar Transforaminal epidural steroid injection with fluoroscopic guidance.  The patient has failed conservative care including home exercise, medications, time and activity modification.  This injection will be diagnostic and hopefully therapeutic.  Please see requesting physician notes for further details and justification.  Staff did speak with family postinjection for recommendations in terms of patient instructions.  Again clinically he has significant myelopathic issues and myelopathic changes from the cervical spine and prior cervical surgery by Dr. Onetha.  He needs to follow-up with Dr. Onetha in terms of his neck and weakness.   ROS Otherwise per HPI.  Assessment & Plan: Visit Diagnoses:    ICD-10-CM   1. Lumbar radiculopathy  M54.16 XR C-ARM NO REPORT    Epidural Steroid injection    methylPREDNISolone  acetate (DEPO-MEDROL ) injection 40 mg      Plan: No additional findings.   Meds & Orders:  Meds ordered this encounter  Medications   methylPREDNISolone  acetate (DEPO-MEDROL ) injection 40 mg    Orders Placed This Encounter  Procedures   XR C-ARM NO REPORT   Epidural Steroid injection    Follow-up: Return for visit to requesting provider as needed.   Procedures: No procedures performed  Lumbosacral Transforaminal Epidural Steroid Injection - Sub-Pedicular Approach with Fluoroscopic Guidance  Patient: Keith Fisher      Date of Birth: July 24, 1946 MRN: [id number] PCP: Kip Righter, MD      Visit Date: 04/21/2024   Universal Protocol:    Date/Time: 04/21/2024  Consent Given By: the patient  Position:  PRONE  Additional Comments: Vital signs were monitored before and after the procedure. Patient was prepped and draped in the usual sterile fashion. The correct patient, procedure, and site was verified.   Injection Procedure Details:   Procedure diagnoses: Lumbar radiculopathy [M54.16]    Meds Administered:  Meds ordered this encounter  Medications   methylPREDNISolone  acetate (DEPO-MEDROL ) injection 40 mg    Laterality: Bilateral  Location/Site: L3  Needle:5.0 in., 22 ga.  Short bevel or Quincke spinal needle  Needle Placement: Transforaminal  Findings:    -Comments: Excellent flow of contrast along the nerve, nerve root and into the epidural space.  Procedure Details: After squaring off the end-plates to get a true AP view, the C-arm was positioned so that an oblique view of the foramen as noted above was visualized. The target area is just inferior to the nose of the scotty dog or sub pedicular. The soft tissues overlying this structure were infiltrated with 2-3 ml. of 1% Lidocaine  without Epinephrine .  The spinal needle was inserted toward the target using a trajectory view along the fluoroscope beam.  Under AP and lateral visualization, the needle was advanced so it did not puncture dura and was located close the 6 O'Clock position of the pedical in AP tracterory. Biplanar projections were used to confirm position. Aspiration was confirmed to be negative for CSF and/or blood. A 1-2 ml. volume of Isovue-250 was injected and flow of contrast was noted at each level. Radiographs were obtained for documentation purposes.   After attaining the desired flow of contrast documented  above, a 0.5 to 1.0 ml test dose of 0.25% Marcaine  was injected into each respective transforaminal space.  The patient was observed for 90 seconds post injection.  After no sensory deficits were reported, and normal lower extremity motor function was noted,   the above injectate was administered so  that equal amounts of the injectate were placed at each foramen (level) into the transforaminal epidural space.   Additional Comments:  The patient tolerated the procedure well Dressing: 2 x 2 sterile gauze and Band-Aid    Post-procedure details: Patient was observed during the procedure. Post-procedure instructions were reviewed.  Patient left the clinic in stable condition.    Clinical History: No specialty comments available.     Objective:  VS:  HT:    WT:   BMI:     BP:134/79  HR:89bpm  TEMP: ( )  RESP:  Physical Exam Vitals and nursing note reviewed.  Constitutional:      General: He is not in acute distress.    Appearance: Normal appearance. He is not ill-appearing.  HENT:     Head: Normocephalic and atraumatic.     Right Ear: External ear normal.     Left Ear: External ear normal.     Nose: No congestion.  Eyes:     Extraocular Movements: Extraocular movements intact.  Cardiovascular:     Rate and Rhythm: Normal rate.     Pulses: Normal pulses.  Pulmonary:     Effort: Pulmonary effort is normal. No respiratory distress.  Abdominal:     General: There is no distension.     Palpations: Abdomen is soft.  Musculoskeletal:        General: No tenderness or signs of injury.     Cervical back: Neck supple.     Right lower leg: No edema.     Left lower leg: No edema.     Comments: Patient has good distal strength without clonus.  Skin:    Findings: No erythema or rash.  Neurological:     General: No focal deficit present.     Mental Status: He is alert and oriented to person, place, and time.     Cranial Nerves: No cranial nerve deficit.     Sensory: Sensory deficit present.     Motor: Weakness present. No abnormal muscle tone.     Coordination: Coordination normal.     Gait: Gait abnormal.  Psychiatric:        Mood and Affect: Mood normal.        Behavior: Behavior normal.      Imaging: No results found.

## 2024-04-27 NOTE — Procedures (Signed)
 Lumbosacral Transforaminal Epidural Steroid Injection - Sub-Pedicular Approach with Fluoroscopic Guidance  Patient: Keith Fisher      Date of Birth: 01/09/1946 MRN: [id number] PCP: Kip Righter, MD      Visit Date: 04/21/2024   Universal Protocol:    Date/Time: 04/21/2024  Consent Given By: the patient  Position: PRONE  Additional Comments: Vital signs were monitored before and after the procedure. Patient was prepped and draped in the usual sterile fashion. The correct patient, procedure, and site was verified.   Injection Procedure Details:   Procedure diagnoses: Lumbar radiculopathy [M54.16]    Meds Administered:  Meds ordered this encounter  Medications   methylPREDNISolone  acetate (DEPO-MEDROL ) injection 40 mg    Laterality: Bilateral  Location/Site: L3  Needle:5.0 in., 22 ga.  Short bevel or Quincke spinal needle  Needle Placement: Transforaminal  Findings:    -Comments: Excellent flow of contrast along the nerve, nerve root and into the epidural space.  Procedure Details: After squaring off the end-plates to get a true AP view, the C-arm was positioned so that an oblique view of the foramen as noted above was visualized. The target area is just inferior to the nose of the scotty dog or sub pedicular. The soft tissues overlying this structure were infiltrated with 2-3 ml. of 1% Lidocaine  without Epinephrine .  The spinal needle was inserted toward the target using a trajectory view along the fluoroscope beam.  Under AP and lateral visualization, the needle was advanced so it did not puncture dura and was located close the 6 O'Clock position of the pedical in AP tracterory. Biplanar projections were used to confirm position. Aspiration was confirmed to be negative for CSF and/or blood. A 1-2 ml. volume of Isovue-250 was injected and flow of contrast was noted at each level. Radiographs were obtained for documentation purposes.   After attaining the desired flow of  contrast documented above, a 0.5 to 1.0 ml test dose of 0.25% Marcaine  was injected into each respective transforaminal space.  The patient was observed for 90 seconds post injection.  After no sensory deficits were reported, and normal lower extremity motor function was noted,   the above injectate was administered so that equal amounts of the injectate were placed at each foramen (level) into the transforaminal epidural space.   Additional Comments:  The patient tolerated the procedure well Dressing: 2 x 2 sterile gauze and Band-Aid    Post-procedure details: Patient was observed during the procedure. Post-procedure instructions were reviewed.  Patient left the clinic in stable condition.

## 2024-05-19 DIAGNOSIS — I4891 Unspecified atrial fibrillation: Secondary | ICD-10-CM | POA: Diagnosis not present

## 2024-05-19 DIAGNOSIS — G729 Myopathy, unspecified: Secondary | ICD-10-CM | POA: Diagnosis not present

## 2024-05-19 DIAGNOSIS — E538 Deficiency of other specified B group vitamins: Secondary | ICD-10-CM | POA: Diagnosis not present

## 2024-05-19 DIAGNOSIS — Z23 Encounter for immunization: Secondary | ICD-10-CM | POA: Diagnosis not present

## 2024-05-19 DIAGNOSIS — E119 Type 2 diabetes mellitus without complications: Secondary | ICD-10-CM | POA: Diagnosis not present

## 2024-05-26 DIAGNOSIS — M48062 Spinal stenosis, lumbar region with neurogenic claudication: Secondary | ICD-10-CM | POA: Diagnosis not present

## 2024-06-16 ENCOUNTER — Ambulatory Visit: Payer: Medicare (Managed Care) | Attending: Surgery | Admitting: Physician Assistant

## 2024-06-16 ENCOUNTER — Encounter: Payer: Self-pay | Admitting: Physician Assistant

## 2024-06-16 VITALS — BP 127/86 | HR 86 | Temp 97.7°F | Wt 186.5 lb

## 2024-06-16 DIAGNOSIS — M7989 Other specified soft tissue disorders: Secondary | ICD-10-CM

## 2024-06-16 NOTE — Progress Notes (Signed)
 [facility]           OF [facility]  History and Physical   Keith Fisher is a 78 y.o. male who presents with right leg swelling.    He states he has had right leg swelling since the 70's.  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  He states that he has never had any blood clots.  He does have family hx of leg swelling.  He does use compression daily, which controls his swelling.  He denies any achiness or pain or ulcers or weeping of the RLE.   He states he has arthritis in his right knee from his soccer days.   He states that he needs surgery on his right knee for arthritis, but they will not do it as he was told it would make his swelling worse.    His wife states that he was bitten by a mosquito, which is what caused his swelling.  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   He is on Xarelto  for persistent afib.    The pt is not on a statin for cholesterol management.  The pt is not on a daily aspirin .   Other AC:  Xarelto  The pt is on diuretic for hypertension.   The pt is not on medication for diabetes.   Tobacco hx:  never   Past Medical History:  Diagnosis Date   Arthritis    Dyspnea    Lymphatic filariasis    Mitral valve regurgitation    Pulmonary hypertension (HCC)    Tricuspid regurgitation     Past Surgical History:  Procedure Laterality Date   BUBBLE STUDY  11/22/2021   Procedure: BUBBLE STUDY;  Surgeon: Ladona Heinz, MD;  Location: [facility] ENDOSCOPY;  Service: Cardiovascular;;   CARDIOVERSION N/A 11/22/2021   Procedure: CARDIOVERSION;  Surgeon: Ladona Heinz, MD;  Location: [facility] ENDOSCOPY;  Service: Cardiovascular;  Laterality: N/A;   CARDIOVERSION N/A 12/27/2021   Procedure: CARDIOVERSION;  Surgeon: Ladona Heinz, MD;  Location: [facility] ENDOSCOPY;  Service: Cardiovascular;  Laterality: N/A;   CARDIOVERSION N/A 01/25/2022   Procedure: CARDIOVERSION;  Surgeon: Elmira Newman PARAS, MD;  Location: [facility]  ENDOSCOPY;  Service: Cardiovascular;  Laterality: N/A;   INGUINAL HERNIA REPAIR Right 06/13/2021   Procedure: LAPAROSCOPIC RIGHT INGUINAL HERNIA REPAIR WITH MESH;  Surgeon: Rubin Calamity, MD;  Location: [facility] OR;  Service: General;  Laterality: Right;   INSERTION OF MESH Right 06/13/2021   Procedure: INSERTION OF MESH;  Surgeon: Rubin Calamity, MD;  Location: [facility] OR;  Service: General;  Laterality: Right;   POSTERIOR CERVICAL FUSION/FORAMINOTOMY N/A 05/14/2019   Procedure: Posterior Cervical Fusion and decompression with lateral mass fixation - Cervical Three-Four,Cervical Four-Five, Cervical Five-Six, Cervical Six-Seven.;  Surgeon: Onetha Kuba, MD;  Location: [facility] OR;  Service: Neurosurgery;  Laterality: N/A;  posterior   RIGHT/LEFT HEART CATH AND CORONARY ANGIOGRAPHY N/A 11/22/2021   Procedure: RIGHT/LEFT HEART CATH AND CORONARY ANGIOGRAPHY;  Surgeon: Ladona Heinz, MD;  Location: [facility] INVASIVE CV LAB;  Service: Cardiovascular;  Laterality: N/A;   SHOULDER SURGERY     TEE WITHOUT CARDIOVERSION N/A 03/06/2017   Procedure: TRANSESOPHAGEAL ECHOCARDIOGRAM (TEE);  Surgeon: Ladona Heinz, MD;  Location: [facility] ENDOSCOPY;  Service: Cardiovascular;  Laterality: N/A;   TEE WITHOUT CARDIOVERSION N/A 11/22/2021   Procedure: TRANSESOPHAGEAL ECHOCARDIOGRAM (TEE);  Surgeon: Ladona Heinz, MD;  Location: [facility] ENDOSCOPY;  Service: Cardiovascular;  Laterality: N/A;  Social History   Socioeconomic History   Marital status: Married    Spouse name: Not on file   Number of children: 1   Years of education: Not on file   Highest education level: Not on file  Occupational History   Not on file  Tobacco Use   Smoking status: Never   Smokeless tobacco: Never  Vaping Use   Vaping status: Never Used  Substance and Sexual Activity   Alcohol use: No   Drug use: No   Sexual activity: Not on file  Other Topics Concern   Not on file  Social History Narrative   Not on file   Social Drivers of Health   Financial Resource Strain:  Not on file  Food Insecurity: Not on file  Transportation Needs: Not on file  Physical Activity: Not on file  Stress: Not on file  Social Connections: Not on file  Intimate Partner Violence: Not on file     Family History  Problem Relation Age of Onset   Neuropathy Neg Hx     Current Outpatient Medications  Medication Sig Dispense Refill   acetaminophen -codeine  (TYLENOL  #3) 300-30 MG tablet Take 1 tablet by mouth every 8 (eight) hours as needed for moderate pain. 30 tablet 0   furosemide  (LASIX ) 40 MG tablet TAKE 1 TABLET BY MOUTH ONCE DAILY AS NEEDED FOR  FLUID  OR  EDEMA 90 tablet 3   gabapentin  (NEURONTIN ) 600 MG tablet Take 600 mg by mouth 3 (three) times daily as needed.     potassium chloride  SA (KLOR-CON  M20) 20 MEQ tablet Take 2 tablets (40 mEq total) by mouth daily as needed (with Lasix ). (Patient not taking: Reported on 04/08/2024) 90 tablet 3   traMADol  (ULTRAM ) 50 MG tablet Take 1 tablet (50 mg total) by mouth every 6 (six) hours as needed. (Patient not taking: Reported on 04/08/2024) 60 tablet 2   XARELTO  20 MG TABS tablet TAKE 1 TABLET(20 MG) BY MOUTH DAILY WITH SUPPER 90 tablet 3   No current facility-administered medications for this visit.    Allergies  Allergen Reactions   Penicillins    Digoxin  And Related Anxiety    A combination of metoprolol  and digoxin  caused anxiety   Metoprolol  Anxiety    A combination of metoprolol  and digoxin  caused anxiety    REVIEW OF SYSTEMS:   [X]  denotes positive finding, [ ]  denotes negative finding Cardiac  Comments:  Chest pain or chest pressure:    Shortness of breath upon exertion:    Short of breath when lying flat:    Irregular heart rhythm: x       Vascular    Pain in calf, thigh, or hip brought on by ambulation:    Pain in feet at night that wakes you up from your sleep:     Blood clot in your veins:    Leg swelling:  x       Pulmonary    Oxygen at home:    Productive cough:     Wheezing:          Neurologic    Sudden weakness in arms or legs:     Sudden numbness in arms or legs:     Sudden onset of difficulty speaking or slurred speech:    Temporary loss of vision in one eye:     Problems with dizziness:         Gastrointestinal    Blood in stool:     Vomited blood:  Genitourinary    Burning when urinating:     Blood in urine:        Psychiatric    Major depression:         Hematologic    Bleeding problems:    Problems with blood clotting too easily:        Skin    Rashes or ulcers:        Constitutional    Fever or chills:      PHYSICAL EXAMINATION:  Today's Vitals   06/16/24 0820  BP: 127/86  Pulse: 86  Temp: 97.7 F (36.5 C)  TempSrc: Temporal  Weight: 186 lb 8 oz (84.6 kg)  PainSc: 0-No pain   Body mass index is 26.76 kg/m.   General:  WDWN in NAD; vital signs documented above Gait: Not observed HENT: WNL, normocephalic Pulmonary: normal non-labored breathing without wheezing Cardiac: regular HR; without carotid bruits Skin: without rashes Vascular Exam/Pulses:  Right Left  Radial 2+ (normal) 2+ (normal)  DP 2+ (normal) 2+ (normal)   Extremities: hemosiderin staining LLE with superficial varicosities and spider veins LLE.  RLE with swelling from knee to ankle.  Stemmer sign right foot is negative.   Neurologic: A&O X 3;  moving all extremities equally Psychiatric:  The pt has Normal affect.   Non-Invasive Vascular Imaging:   Venous duplex on 04/04/2024: Venous Reflux Times  +--------------+---------+------+-----------+------------+--------+  RIGHT        Reflux NoRefluxReflux TimeDiameter cmsComments                          Yes                                   +--------------+---------+------+-----------+------------+--------+  CFV          no                                              +--------------+---------+------+-----------+------------+--------+  FV mid        no                                               +--------------+---------+------+-----------+------------+--------+  Popliteal    no                                              +--------------+---------+------+-----------+------------+--------+  GSV at [facility]    no                            .59               +--------------+---------+------+-----------+------------+--------+  GSV prox thighno                            .24               +--------------+---------+------+-----------+------------+--------+  GSV mid thigh no                            .  31               +--------------+---------+------+-----------+------------+--------+  GSV dist thighno                            .32               +--------------+---------+------+-----------+------------+--------+  GSV at knee   no                            .28               +--------------+---------+------+-----------+------------+--------+  GSV prox calf no                            .32               +--------------+---------+------+-----------+------------+--------+  SSV at [facility]    no                            .24               +--------------+---------+------+-----------+------------+--------+  SSV prox calf no                            .30               +--------------+---------+------+-----------+------------+--------+   Summary:  Right:  - No evidence of deep vein thrombosis seen in the right lower extremity, from the common femoral through the popliteal veins.  - No evidence of superficial venous thrombosis in the right lower  extremity.  - There is no evidence of venous reflux seen in the right lower extremity.     Keith Fisher is a 78 y.o. male who presents with: RLE swelling, which sounds as if he was bitten by mosquito many years ago and Dr. Ladona mentions pt has right leg lymphatic filariasis.  He is asymptomatic from this.   -pt has easily palpable DP pedal pulses bilaterally -in the right  lower extremity, the pt does not have evidence of DVT.  Pt does not have venous reflux in the deep or superficial venous system.  He does not have any venous insufficiency in the right leg and therefore, not a candidate for laser ablation.  -discussed with pt about continuing to wear compression as it is controlling his swelling  -discussed the importance of leg elevation and how to elevate properly - pt is advised to elevate their legs and a diagram is given to them to demonstrate for pt to lay flat on their back with knees elevated and slightly bent with their feet higher than their knees, which puts their feet higher than their heart for 15 minutes per day.  -pt is advised to continue as much walking as possible and avoid sitting or standing for long periods of time.  -discussed importance of exercise and that water aerobics would also be beneficial.  He does go to the gym every day.  -he does have some spider veins around the ankle of the left leg.  Discussed with him if they should bleed, to elevate leg as above and hold pressure for 20-30 minutes and it should stop.  -handout with recommendations given -pt will f/u as needed.    Lucie Apt, [facility] [phone]  Clinic  MD:  Serene

## 2024-06-23 ENCOUNTER — Ambulatory Visit: Payer: Medicare (Managed Care) | Admitting: Surgical

## 2024-06-23 ENCOUNTER — Other Ambulatory Visit (INDEPENDENT_AMBULATORY_CARE_PROVIDER_SITE_OTHER): Payer: Medicare (Managed Care)

## 2024-06-23 ENCOUNTER — Other Ambulatory Visit: Payer: Self-pay

## 2024-06-23 ENCOUNTER — Encounter: Payer: Self-pay | Admitting: Surgical

## 2024-06-23 DIAGNOSIS — G8929 Other chronic pain: Secondary | ICD-10-CM

## 2024-06-23 DIAGNOSIS — M25511 Pain in right shoulder: Secondary | ICD-10-CM

## 2024-06-23 DIAGNOSIS — M19012 Primary osteoarthritis, left shoulder: Secondary | ICD-10-CM

## 2024-06-23 DIAGNOSIS — M25512 Pain in left shoulder: Secondary | ICD-10-CM

## 2024-06-23 DIAGNOSIS — M19011 Primary osteoarthritis, right shoulder: Secondary | ICD-10-CM | POA: Diagnosis not present

## 2024-06-23 MED ORDER — BUPIVACAINE HCL 0.25 % IJ SOLN
9.0000 mL | INTRAMUSCULAR | Status: AC | PRN
  Administered 2024-06-23: 9 mL via INTRA_ARTICULAR

## 2024-06-23 MED ORDER — TRIAMCINOLONE ACETONIDE 40 MG/ML IJ SUSP
40.0000 mg | INTRAMUSCULAR | Status: AC | PRN
  Administered 2024-06-23: 40 mg via INTRA_ARTICULAR

## 2024-06-23 NOTE — Progress Notes (Signed)
 Office Visit Note   Patient: Keith Fisher           Date of Birth: 1946/02/22           MRN: [id number] Visit Date: 06/23/2024 Requested by: Kip Righter, MD [address] [place],  [place] [address] PCP: Kip Righter, MD  Subjective: Chief Complaint  Patient presents with   Right Shoulder - Pain   Left Shoulder - Pain    HPI: Keith Fisher is a 78 y.o. male who presents to the office reporting bilateral shoulder pain.  Patient states that he has had pain for several months.  Right shoulder bothers him slightly more than the left shoulder.  Describes diffuse pain that is worse with sleeping and if he tries to lift anything.  It will keep him awake at night.  He goes to the gym for strengthening but the shoulders have both been limiting him in his ability to do his workouts.  Has taken gabapentin  and Tylenol  with some relief.  Both shoulders are stiff in the morning.  He has history of prior surgery to the right shoulder but he cannot recall what this was.  He has incision in the anterolateral aspect of the shoulder in the mini open region.  No radiating pain down the arm or numbness/tingling in his shoulders or arms..                ROS: All systems reviewed are negative as they relate to the chief complaint within the history of present illness.  Patient denies fevers or chills.  Assessment & Plan: Visit Diagnoses:  1. Bilateral shoulder region arthritis   2. Chronic pain of both shoulders     Plan: Impression is bilateral shoulder early glenohumeral arthritis, slightly worse in the right shoulder with reduced range of motion and slightly more prominent inferior humeral head osteophyte noted on radiographs.  We discussed options available to patient.  He would like to try injection into both shoulders today.  Tolerated procedure well without complication as both injections were administered under ultrasound guidance into the glenohumeral joints.  We will see how this does  for him.  He will return in 2 to 3 weeks for aspiration/injection of his right knee with Dr. Addie.  Discussed all of this with patient's son over the phone.  He also does have some rotator cuff weakness of the right shoulder which goes with his prior history of what appears to be rotator cuff repair with presence of anchor on radiographs and mini open incision noted on exam.  Unsure if this weakness is chronic for him.  Follow-Up Instructions: Return in about 2 weeks (around 07/07/2024).   Orders:  Orders Placed This Encounter  Procedures   XR Shoulder Left   XR Shoulder Right   US  Guided Needle Placement - No Linked Charges   No orders of the defined types were placed in this encounter.     Procedures: Large Joint Inj: R glenohumeral on 06/23/2024 1:02 PM Indications: pain and diagnostic evaluation Details: 22 G 3.5 in needle, ultrasound-guided posterior approach Medications: 9 mL bupivacaine  0.25 %; 40 mg triamcinolone  acetonide 40 MG/ML Outcome: tolerated well, no immediate complications Procedure, treatment alternatives, risks and benefits explained, specific risks discussed. Consent was given by the patient. Immediately prior to procedure a time out was called to verify the correct patient, procedure, equipment, support staff and site/side marked as required. Patient was prepped and draped in the usual sterile fashion.  Large Joint Inj: L glenohumeral on 06/23/2024 1:03 PM Indications: pain and diagnostic evaluation Details: 22 G 3.5 in needle, ultrasound-guided posterior approach Medications: 9 mL bupivacaine  0.25 %; 40 mg triamcinolone  acetonide 40 MG/ML Outcome: tolerated well, no immediate complications Procedure, treatment alternatives, risks and benefits explained, specific risks discussed. Consent was given by the patient. Immediately prior to procedure a time out was called to verify the correct patient, procedure, equipment, support staff and site/side marked as required.  Patient was prepped and draped in the usual sterile fashion.       Clinical Data: No additional findings.  Objective: Vital Signs: There were no vitals taken for this visit.  Physical Exam:  Constitutional: Patient appears well-developed HEENT:  Head: Normocephalic Eyes:EOM are normal Neck: Normal range of motion Cardiovascular: Normal rate Pulmonary/chest: Effort normal Neurologic: Patient is alert Skin: Skin is warm Psychiatric: Patient has normal mood and affect  Ortho Exam: Ortho ortho exam demonstrates left shoulder with 45 degrees X rotation, 100 degrees abduction, 150 degrees forward elevation which is compared with the right shoulder with 30 degrees external rotation, 80 degrees abduction, 150 degrees forward elevation passively.  No obvious deformity to inspection of the shoulder.  Axillary nerve is intact with deltoid firing.  2+ radial pulse of the bilateral upper extremities.  Intact EPL, FPL, finger abduction, pronation/supination, bicep, tricep, deltoid.  Rotator cuff strength testing demonstrates 5/5 supra/infra/subscap strength of the left shoulder with 4/5 supra and infra strength of the right shoulder and 5/5 subscap strength of the right shoulder.  Tender with palpation over bicipital groove of the right shoulder and left shoulder.  No AC joint tenderness.SABRA  Positive Neer's and Hawkins impingement signs.  No cellulitis or skin changes noted.  Mild crepitus noted with passive motion of the right shoulder.  Negative external rotation lag sign.  Negative Hornblower sign.  Negative Spurling sign.  Negative Lhermitte sign.  No pain reproduced with cervical spine range of motion.  Specialty Comments:  No specialty comments available.  Imaging: No results found.   PMFS History: Patient Active Problem List   Diagnosis Date Noted   Abnormal gait 04/08/2024   Arthritis of right knee 04/08/2024   Diabetes mellitus (HCC) 04/08/2024   Symptom of leg swelling 04/08/2024    Symptoms involving urinary system 04/08/2024   Typical atrial flutter (HCC)    Tricuspid regurgitation 11/22/2021   Persistent atrial fibrillation (HCC) 11/22/2021   Myelopathy (HCC) 05/14/2019   Dyspnea on exertion 03/05/2017   Mitral regurgitation 03/05/2017   Past Medical History:  Diagnosis Date   Arthritis    Dyspnea    Lymphatic filariasis    Mitral valve regurgitation    Pulmonary hypertension (HCC)    Tricuspid regurgitation     Family History  Problem Relation Age of Onset   Neuropathy Neg Hx     Past Surgical History:  Procedure Laterality Date   BUBBLE STUDY  11/22/2021   Procedure: BUBBLE STUDY;  Surgeon: Ladona Heinz, MD;  Location: [facility] ENDOSCOPY;  Service: Cardiovascular;;   CARDIOVERSION N/A 11/22/2021   Procedure: CARDIOVERSION;  Surgeon: Ladona Heinz, MD;  Location: [facility] ENDOSCOPY;  Service: Cardiovascular;  Laterality: N/A;   CARDIOVERSION N/A 12/27/2021   Procedure: CARDIOVERSION;  Surgeon: Ladona Heinz, MD;  Location: [facility] ENDOSCOPY;  Service: Cardiovascular;  Laterality: N/A;   CARDIOVERSION N/A 01/25/2022   Procedure: CARDIOVERSION;  Surgeon: Elmira Newman PARAS, MD;  Location: [facility] ENDOSCOPY;  Service: Cardiovascular;  Laterality: N/A;   INGUINAL HERNIA REPAIR Right 06/13/2021   Procedure:  LAPAROSCOPIC RIGHT INGUINAL HERNIA REPAIR WITH MESH;  Surgeon: Rubin Calamity, MD;  Location: [facility] OR;  Service: General;  Laterality: Right;   INSERTION OF MESH Right 06/13/2021   Procedure: INSERTION OF MESH;  Surgeon: Rubin Calamity, MD;  Location: [facility] OR;  Service: General;  Laterality: Right;   POSTERIOR CERVICAL FUSION/FORAMINOTOMY N/A 05/14/2019   Procedure: Posterior Cervical Fusion and decompression with lateral mass fixation - Cervical Three-Four,Cervical Four-Five, Cervical Five-Six, Cervical Six-Seven.;  Surgeon: Onetha Kuba, MD;  Location: [facility] OR;  Service: Neurosurgery;  Laterality: N/A;  posterior   RIGHT/LEFT HEART CATH AND CORONARY ANGIOGRAPHY N/A 11/22/2021   Procedure:  RIGHT/LEFT HEART CATH AND CORONARY ANGIOGRAPHY;  Surgeon: Ladona Heinz, MD;  Location: [facility] INVASIVE CV LAB;  Service: Cardiovascular;  Laterality: N/A;   SHOULDER SURGERY     TEE WITHOUT CARDIOVERSION N/A 03/06/2017   Procedure: TRANSESOPHAGEAL ECHOCARDIOGRAM (TEE);  Surgeon: Ladona Heinz, MD;  Location: [facility] ENDOSCOPY;  Service: Cardiovascular;  Laterality: N/A;   TEE WITHOUT CARDIOVERSION N/A 11/22/2021   Procedure: TRANSESOPHAGEAL ECHOCARDIOGRAM (TEE);  Surgeon: Ladona Heinz, MD;  Location: [facility] ENDOSCOPY;  Service: Cardiovascular;  Laterality: N/A;   Social History   Occupational History   Not on file  Tobacco Use   Smoking status: Never   Smokeless tobacco: Never  Vaping Use   Vaping status: Never Used  Substance and Sexual Activity   Alcohol use: No   Drug use: No   Sexual activity: Not on file

## 2024-07-09 ENCOUNTER — Ambulatory Visit: Payer: Medicare (Managed Care) | Admitting: Orthopedic Surgery

## 2024-07-09 DIAGNOSIS — M1711 Unilateral primary osteoarthritis, right knee: Secondary | ICD-10-CM

## 2024-07-09 NOTE — Progress Notes (Signed)
 Office Visit Note   Patient: Keith Fisher           Date of Birth: 04/14/1946           MRN: [id number] Visit Date: 07/09/2024 Requested by: Kip Righter, MD [address] [place],  [place] [address] PCP: Kip Righter, MD  Subjective: Chief Complaint  Patient presents with   Right Knee - Pain    HPI: JONTE SHILLER is a 78 y.o. male who presents to the office reporting continued right knee pain.  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  He does get relief from aspiration and injections periodically..                ROS: All systems reviewed are negative as they relate to the chief complaint within the history of present illness.  Patient denies fevers or chills.  Assessment & Plan: Visit Diagnoses:  1. Arthritis of right knee     Plan: Impression is right knee pain with arthritis.  Move on still he is very active for his age and medical comorbidities.  Does go to the gym.  Right knee aspiration injection performed today.  As long as they are helping we can do those injections every 3 to 4 months.  Follow-Up Instructions: Return in about 3 months (around 10/09/2024).   Orders:  No orders of the defined types were placed in this encounter.  No orders of the defined types were placed in this encounter.     Procedures: Large Joint Inj: R knee on 07/09/2024 12:05 PM Indications: diagnostic evaluation, joint swelling and pain Details: 18 G 1.5 in needle, superolateral approach  Arthrogram: No  Medications: 5 mL lidocaine  1 %; 4 mL bupivacaine  0.25 %; 40 mg triamcinolone  acetonide 40 MG/ML Outcome: tolerated well, no immediate complications Procedure, treatment alternatives, risks and benefits explained, specific risks discussed. Consent was given by the patient. Immediately prior to procedure a time out was called to verify the correct patient, procedure,  equipment, support staff and site/side marked as required. Patient was prepped and draped in the usual sterile fashion.    60 cc asp   Clinical Data: No additional findings.  Objective: Vital Signs: There were no vitals taken for this visit.  Physical Exam:  Constitutional: Patient appears well-developed HEENT:  Head: Normocephalic Eyes:EOM are normal Neck: Normal range of motion Cardiovascular: Normal rate Pulmonary/chest: Effort normal Neurologic: Patient is alert Skin: Skin is warm Psychiatric: Patient has normal mood and affect  Ortho Exam: Ortho exam demonstrates valgus alignment right lower extremity.  Mild to moderate effusion is present.  Extensor mechanism intact.  No groin pain with internal/external Tatian of the leg.  Pedal pulses palpable.  Specialty Comments:  No specialty comments available.  Imaging: No results found.   PMFS History: Patient Active Problem List   Diagnosis Date Noted   Abnormal gait 04/08/2024   Arthritis of right knee 04/08/2024   Diabetes mellitus (HCC) 04/08/2024   Symptom of leg swelling 04/08/2024   Symptoms involving urinary system 04/08/2024   Typical atrial flutter (HCC)    Tricuspid regurgitation 11/22/2021   Persistent atrial fibrillation (HCC) 11/22/2021   Myelopathy (HCC) 05/14/2019   Dyspnea on exertion 03/05/2017   Mitral regurgitation 03/05/2017   Past Medical History:  Diagnosis Date   Arthritis    Dyspnea    Lymphatic filariasis    Mitral valve regurgitation  Pulmonary hypertension (HCC)    Tricuspid regurgitation     Family History  Problem Relation Age of Onset   Neuropathy Neg Hx     Past Surgical History:  Procedure Laterality Date   BUBBLE STUDY  11/22/2021   Procedure: BUBBLE STUDY;  Surgeon: Ladona Heinz, MD;  Location: [facility] ENDOSCOPY;  Service: Cardiovascular;;   CARDIOVERSION N/A 11/22/2021   Procedure: CARDIOVERSION;  Surgeon: Ladona Heinz, MD;  Location: [facility] ENDOSCOPY;  Service: Cardiovascular;   Laterality: N/A;   CARDIOVERSION N/A 12/27/2021   Procedure: CARDIOVERSION;  Surgeon: Ladona Heinz, MD;  Location: [facility] ENDOSCOPY;  Service: Cardiovascular;  Laterality: N/A;   CARDIOVERSION N/A 01/25/2022   Procedure: CARDIOVERSION;  Surgeon: Elmira Newman PARAS, MD;  Location: [facility] ENDOSCOPY;  Service: Cardiovascular;  Laterality: N/A;   INGUINAL HERNIA REPAIR Right 06/13/2021   Procedure: LAPAROSCOPIC RIGHT INGUINAL HERNIA REPAIR WITH MESH;  Surgeon: Rubin Calamity, MD;  Location: [facility] OR;  Service: General;  Laterality: Right;   INSERTION OF MESH Right 06/13/2021   Procedure: INSERTION OF MESH;  Surgeon: Rubin Calamity, MD;  Location: [facility] OR;  Service: General;  Laterality: Right;   POSTERIOR CERVICAL FUSION/FORAMINOTOMY N/A 05/14/2019   Procedure: Posterior Cervical Fusion and decompression with lateral mass fixation - Cervical Three-Four,Cervical Four-Five, Cervical Five-Six, Cervical Six-Seven.;  Surgeon: Onetha Kuba, MD;  Location: [facility] OR;  Service: Neurosurgery;  Laterality: N/A;  posterior   RIGHT/LEFT HEART CATH AND CORONARY ANGIOGRAPHY N/A 11/22/2021   Procedure: RIGHT/LEFT HEART CATH AND CORONARY ANGIOGRAPHY;  Surgeon: Ladona Heinz, MD;  Location: [facility] INVASIVE CV LAB;  Service: Cardiovascular;  Laterality: N/A;   SHOULDER SURGERY     TEE WITHOUT CARDIOVERSION N/A 03/06/2017   Procedure: TRANSESOPHAGEAL ECHOCARDIOGRAM (TEE);  Surgeon: Ladona Heinz, MD;  Location: [facility] ENDOSCOPY;  Service: Cardiovascular;  Laterality: N/A;   TEE WITHOUT CARDIOVERSION N/A 11/22/2021   Procedure: TRANSESOPHAGEAL ECHOCARDIOGRAM (TEE);  Surgeon: Ladona Heinz, MD;  Location: [facility] ENDOSCOPY;  Service: Cardiovascular;  Laterality: N/A;   Social History   Occupational History   Not on file  Tobacco Use   Smoking status: Never   Smokeless tobacco: Never  Vaping Use   Vaping status: Never Used  Substance and Sexual Activity   Alcohol use: No   Drug use: No   Sexual activity: Not on file

## 2024-07-10 DIAGNOSIS — M48062 Spinal stenosis, lumbar region with neurogenic claudication: Secondary | ICD-10-CM | POA: Diagnosis not present

## 2024-07-10 DIAGNOSIS — Z6825 Body mass index (BMI) 25.0-25.9, adult: Secondary | ICD-10-CM | POA: Diagnosis not present

## 2024-07-13 ENCOUNTER — Encounter: Payer: Self-pay | Admitting: Orthopedic Surgery

## 2024-07-13 MED ORDER — TRIAMCINOLONE ACETONIDE 40 MG/ML IJ SUSP
40.0000 mg | INTRAMUSCULAR | Status: AC | PRN
  Administered 2024-07-09: 40 mg via INTRA_ARTICULAR

## 2024-07-13 MED ORDER — LIDOCAINE HCL 1 % IJ SOLN
5.0000 mL | INTRAMUSCULAR | Status: AC | PRN
  Administered 2024-07-09: 5 mL

## 2024-07-13 MED ORDER — BUPIVACAINE HCL 0.25 % IJ SOLN
4.0000 mL | INTRAMUSCULAR | Status: AC | PRN
  Administered 2024-07-09: 4 mL via INTRA_ARTICULAR

## 2024-07-14 ENCOUNTER — Encounter: Payer: Self-pay | Admitting: Radiology

## 2024-08-19 ENCOUNTER — Encounter (HOSPITAL_COMMUNITY): Payer: Self-pay | Admitting: General Surgery

## 2024-08-20 DIAGNOSIS — G729 Myopathy, unspecified: Secondary | ICD-10-CM | POA: Diagnosis not present

## 2024-08-20 DIAGNOSIS — E119 Type 2 diabetes mellitus without complications: Secondary | ICD-10-CM | POA: Diagnosis not present

## 2024-08-20 DIAGNOSIS — I4891 Unspecified atrial fibrillation: Secondary | ICD-10-CM | POA: Diagnosis not present

## 2024-08-20 DIAGNOSIS — R350 Frequency of micturition: Secondary | ICD-10-CM | POA: Diagnosis not present

## 2024-08-20 DIAGNOSIS — M4712 Other spondylosis with myelopathy, cervical region: Secondary | ICD-10-CM | POA: Diagnosis not present

## 2024-08-20 DIAGNOSIS — G959 Disease of spinal cord, unspecified: Secondary | ICD-10-CM | POA: Diagnosis not present

## 2024-08-20 DIAGNOSIS — Z0000 Encounter for general adult medical examination without abnormal findings: Secondary | ICD-10-CM | POA: Diagnosis not present

## 2024-08-20 DIAGNOSIS — M1711 Unilateral primary osteoarthritis, right knee: Secondary | ICD-10-CM | POA: Diagnosis not present

## 2024-08-20 DIAGNOSIS — B749 Filariasis, unspecified: Secondary | ICD-10-CM | POA: Diagnosis not present

## 2024-08-20 DIAGNOSIS — R262 Difficulty in walking, not elsewhere classified: Secondary | ICD-10-CM | POA: Diagnosis not present

## 2024-08-20 DIAGNOSIS — E538 Deficiency of other specified B group vitamins: Secondary | ICD-10-CM | POA: Diagnosis not present

## 2024-08-20 DIAGNOSIS — Z23 Encounter for immunization: Secondary | ICD-10-CM | POA: Diagnosis not present

## 2024-08-25 ENCOUNTER — Telehealth: Payer: Self-pay | Admitting: Physical Medicine and Rehabilitation

## 2024-08-25 ENCOUNTER — Other Ambulatory Visit: Payer: Self-pay | Admitting: Physical Medicine and Rehabilitation

## 2024-08-25 DIAGNOSIS — M5416 Radiculopathy, lumbar region: Secondary | ICD-10-CM

## 2024-08-25 NOTE — Telephone Encounter (Signed)
 Patient's son called. Dad would like an injection. Please call son. [phone]

## 2024-09-01 DIAGNOSIS — K6289 Other specified diseases of anus and rectum: Secondary | ICD-10-CM | POA: Diagnosis not present

## 2024-09-10 ENCOUNTER — Telehealth (HOSPITAL_BASED_OUTPATIENT_CLINIC_OR_DEPARTMENT_OTHER): Payer: Self-pay

## 2024-09-10 DIAGNOSIS — K921 Melena: Secondary | ICD-10-CM | POA: Diagnosis not present

## 2024-09-10 DIAGNOSIS — K5900 Constipation, unspecified: Secondary | ICD-10-CM | POA: Diagnosis not present

## 2024-09-10 DIAGNOSIS — Z8679 Personal history of other diseases of the circulatory system: Secondary | ICD-10-CM | POA: Diagnosis not present

## 2024-09-10 DIAGNOSIS — K649 Unspecified hemorrhoids: Secondary | ICD-10-CM | POA: Diagnosis not present

## 2024-09-10 DIAGNOSIS — I4891 Unspecified atrial fibrillation: Secondary | ICD-10-CM | POA: Diagnosis not present

## 2024-09-10 NOTE — Telephone Encounter (Signed)
 Patient with diagnosis of atrial fibrillation on Xarelto  for anticoagulation.    Procedure:  Colonoscopy   Date of Surgery:  Clearance 10/01/24   CHA2DS2-VASc Score = 3   This indicates a 3.2% annual risk of stroke. [name] score is based upon: CHF History: 1 HTN History: 0 Diabetes History: 0 Stroke History: 0 Vascular Disease History: 0 Age Score: 2 Gender Score: 0    CrCl 63 Platelet count 246  Patient has not had an Afib/aflutter ablation in the last 3 months, DCCV within the last 4 weeks or a watchman implanted in the last 45 days   Per office protocol, patient can hold Xarelto  for 2 days prior to procedure.   Patient will not need bridging with Lovenox (enoxaparin) around procedure.  **This guidance is not considered finalized until pre-operative APP has relayed final recommendations.**

## 2024-09-10 NOTE — Telephone Encounter (Signed)
"  ° °  Pre-operative Risk Assessment    Patient Name: Keith Fisher  DOB: 01/23/1946 MRN: [id number]   Date of last office visit: 03/03/24 with Ladona Date of next office visit: NA  Request for Surgical Clearance    Procedure:  Colonoscopy  Date of Surgery:  Clearance 10/01/24                                 Surgeon:  Dr. Elicia Surgeon's Group or Practice Name:  [facility]  Phone number:  [phone] Fax number:  [phone]   Type of Clearance Requested:   - Medical  - Pharmacy:  Hold Rivaroxaban  (Xarelto ) 2 days prior   Type of Anesthesia:  propofol    Additional requests/questions:    Bonney Augustin JONETTA Delores   09/10/2024, 2:19 PM   "

## 2024-09-10 NOTE — Telephone Encounter (Signed)
 Pharmacy please advise on holding Xarelto  for 2 days prior to colonoscopy scheduled for 10/01/2024. Thank you.

## 2024-09-10 NOTE — Telephone Encounter (Signed)
 1st attempt to reach pt regarding surgical clearance and the need for an TELE appointment.  Left pt a detailed message to call back and get that scheduled.

## 2024-09-10 NOTE — Telephone Encounter (Signed)
" ° °  Name: Keith Fisher  DOB: 04-11-1946  MRN: [id number]  Primary Cardiologist: Gordy Bergamo, MD   Preoperative team, please contact this patient and set up a phone call appointment for further preoperative risk assessment. Please obtain consent and complete medication review. Thank you for your help.  I confirm that guidance regarding antiplatelet and oral anticoagulation therapy has been completed and, if necessary, noted below.  Requested pharmacy recommendations on Xarelto   I also confirmed the patient resides in the state of [place] . As per [facility] Medical Board telemedicine laws, the patient must reside in the state in which the provider is licensed.   Lamarr Satterfield, NP 09/10/2024, 2:28 PM [facility]    "

## 2024-09-12 ENCOUNTER — Telehealth (HOSPITAL_BASED_OUTPATIENT_CLINIC_OR_DEPARTMENT_OTHER): Payer: Self-pay | Admitting: *Deleted

## 2024-09-12 NOTE — Telephone Encounter (Signed)
 I s/w the pt's son Dipesh (DPR) who scheduled tele preop appt for the pt 09/19/24. Med rec and consent are done.   Son also tells me that the pt is supposed to have a back injection with Dr. Eldonna 09/22/24. I stated that we have not received a clearance request from Dr. Lyda office. Pt son said he had injections before and he did not need clearance and said he did not need clearance he just wanted us  to know his dad was having an injection as well.

## 2024-09-12 NOTE — Telephone Encounter (Signed)
 Son (Dipesh) returned Pre-op call.

## 2024-09-12 NOTE — Telephone Encounter (Signed)
 I s/w the pt's son Keith Fisher (DPR) who scheduled tele preop appt for the pt 09/19/24. Med rec and consent are done.   Son also tells me that the pt is supposed to have a back injection with Dr. Eldonna 09/22/24. I stated that we have not received a clearance request from Dr. Lyda office. Pt son said he had injections before and he did not need clearance and said he did not need clearance he just wanted us  to know his dad was having an injection as well.      Patient Consent for Virtual Visit        Keith Fisher has provided verbal consent on 09/12/2024 for a virtual visit (video or telephone).   CONSENT FOR VIRTUAL VISIT FOR:  Keith Fisher  By participating in this virtual visit I agree to the following:  I hereby voluntarily request, consent and authorize [facility] and its employed or contracted physicians, physician assistants, nurse practitioners or other licensed health care professionals (the Practitioner), to provide me with telemedicine health care services (the Services) as deemed necessary by the treating Practitioner. I acknowledge and consent to receive the Services by the Practitioner via telemedicine. I understand that the telemedicine visit will involve communicating with the Practitioner through live audiovisual communication technology and the disclosure of certain medical information by electronic transmission. I acknowledge that I have been given the opportunity to request an in-person assessment or other available alternative prior to the telemedicine visit and am voluntarily participating in the telemedicine visit.  I understand that I have the right to withhold or withdraw my consent to the use of telemedicine in the course of my care at any time, without affecting my right to future care or treatment, and that the Practitioner or I may terminate the telemedicine visit at any time. I understand that I have the right to inspect all information obtained and/or recorded in  the course of the telemedicine visit and may receive copies of available information for a reasonable fee.  I understand that some of the potential risks of receiving the Services via telemedicine include:  Delay or interruption in medical evaluation due to technological equipment failure or disruption; Information transmitted may not be sufficient (e.g. poor resolution of images) to allow for appropriate medical decision making by the Practitioner; and/or  In rare instances, security protocols could fail, causing a breach of personal health information.  Furthermore, I acknowledge that it is my responsibility to provide information about my medical history, conditions and care that is complete and accurate to the best of my ability. I acknowledge that Practitioner's advice, recommendations, and/or decision may be based on factors not within their control, such as incomplete or inaccurate data provided by me or distortions of diagnostic images or specimens that may result from electronic transmissions. I understand that the practice of medicine is not an exact science and that Practitioner makes no warranties or guarantees regarding treatment outcomes. I acknowledge that a copy of this consent can be made available to me via my patient portal [facility] MyChart), or I can request a printed copy by calling the office of [facility].    I understand that my insurance will be billed for this visit.   I have read or had this consent read to me. I understand the contents of this consent, which adequately explains the benefits and risks of the Services being provided via telemedicine.  I have been provided ample opportunity to ask questions regarding  this consent and the Services and have had my questions answered to my satisfaction. I give my informed consent for the services to be provided through the use of telemedicine in my medical care

## 2024-09-19 ENCOUNTER — Ambulatory Visit: Payer: Medicare (Managed Care) | Attending: Cardiology | Admitting: Nurse Practitioner

## 2024-09-19 DIAGNOSIS — Z01810 Encounter for preprocedural cardiovascular examination: Secondary | ICD-10-CM | POA: Diagnosis not present

## 2024-09-19 NOTE — Progress Notes (Signed)
 "   Virtual Visit via Telephone Note   Because of TASEAN MANCHA co-morbid illnesses, he is at least at moderate risk for complications without adequate follow up.  This format is felt to be most appropriate for this patient at this time.  Due to technical limitations with video connection web designer), today's appointment will be conducted as an audio only telehealth visit, and [name] verbally agreed to proceed in this manner.   All issues noted in this document were discussed and addressed.  No physical exam could be performed with this format.  Evaluation Performed:  Preoperative cardiovascular risk assessment _____________   Date:  09/19/2024   Patient ID:  Keith Fisher, DOB Jun 20, 1946, MRN [id number] Patient Location:  Home Provider location:   Office  Primary Care Provider:  Kip Righter, MD Primary Cardiologist:  Gordy Bergamo, MD  Chief Complaint / Patient Profile   79 y.o. 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 Elicia of [facility] and presents today for telephonic preoperative cardiovascular risk assessment.  History of Present Illness    Keith Fisher is a 79 y.o. male who presents via audio/video conferencing for a telehealth visit today accompanied by his son.  Pt was last seen in cardiology clinic on 03/03/2024 by Dr. Bergamo.  At that time [name] was doing well. The patient is now pending procedure as outlined above. Since his last visit, he has been stable from a cardiac standpoint.  He denies chest pain, palpitations, dyspnea, pnd, orthopnea, n, v, dizziness, syncope, edema, weight gain, or early satiety. All other systems reviewed and are otherwise negative except as noted above.   Past Medical History    Past Medical History:  Diagnosis Date   Arthritis    Dyspnea     Lymphatic filariasis    Mitral valve regurgitation    Pulmonary hypertension (HCC)    Tricuspid regurgitation    Past Surgical History:  Procedure Laterality Date   BUBBLE STUDY  11/22/2021   Procedure: BUBBLE STUDY;  Surgeon: Bergamo Gordy, MD;  Location: [facility] ENDOSCOPY;  Service: Cardiovascular;;   CARDIOVERSION N/A 11/22/2021   Procedure: CARDIOVERSION;  Surgeon: Bergamo Gordy, MD;  Location: [facility] ENDOSCOPY;  Service: Cardiovascular;  Laterality: N/A;   CARDIOVERSION N/A 12/27/2021   Procedure: CARDIOVERSION;  Surgeon: Bergamo Gordy, MD;  Location: [facility] ENDOSCOPY;  Service: Cardiovascular;  Laterality: N/A;   CARDIOVERSION N/A 01/25/2022   Procedure: CARDIOVERSION;  Surgeon: Elmira Newman PARAS, MD;  Location: [facility] ENDOSCOPY;  Service: Cardiovascular;  Laterality: N/A;   INGUINAL HERNIA REPAIR Right 06/13/2021   Procedure: LAPAROSCOPIC RIGHT INGUINAL HERNIA REPAIR WITH MESH;  Surgeon: Rubin Calamity, MD;  Location: [facility] OR;  Service: General;  Laterality: Right;   INSERTION OF MESH Right 06/13/2021   Procedure: INSERTION OF MESH;  Surgeon: Rubin Calamity, MD;  Location: [facility] OR;  Service: General;  Laterality: Right;   POSTERIOR CERVICAL FUSION/FORAMINOTOMY N/A 05/14/2019   Procedure: Posterior Cervical Fusion and decompression with lateral mass fixation - Cervical Three-Four,Cervical Four-Five, Cervical Five-Six, Cervical Six-Seven.;  Surgeon: Onetha Kuba, MD;  Location: [facility] OR;  Service: Neurosurgery;  Laterality: N/A;  posterior   RIGHT/LEFT HEART CATH AND CORONARY ANGIOGRAPHY N/A 11/22/2021   Procedure: RIGHT/LEFT HEART CATH AND CORONARY ANGIOGRAPHY;  Surgeon: [facility] Gordy, MD;  Location: [facility] INVASIVE CV LAB;  Service: Cardiovascular;  Laterality: N/A;  SHOULDER SURGERY     TEE WITHOUT CARDIOVERSION N/A 03/06/2017   Procedure: TRANSESOPHAGEAL ECHOCARDIOGRAM (TEE);  Surgeon: Ladona Heinz, MD;  Location: [facility] ENDOSCOPY;  Service: Cardiovascular;  Laterality: N/A;   TEE WITHOUT CARDIOVERSION N/A 11/22/2021   Procedure:  TRANSESOPHAGEAL ECHOCARDIOGRAM (TEE);  Surgeon: Ladona Heinz, MD;  Location: [facility] ENDOSCOPY;  Service: Cardiovascular;  Laterality: N/A;    Allergies  Allergies[1]  Home Medications    Prior to Admission medications  Medication Sig Start Date End Date Taking? Authorizing Provider  acetaminophen -codeine  (TYLENOL  #3) 300-30 MG tablet Take 1 tablet by mouth every 8 (eight) hours as needed for moderate pain. 01/19/22   Addie Cordella Hamilton, MD  furosemide  (LASIX ) 40 MG tablet TAKE 1 TABLET BY MOUTH ONCE DAILY AS NEEDED FOR  FLUID  OR  EDEMA 03/19/24   Ladona Heinz, MD  gabapentin  (NEURONTIN ) 600 MG tablet Take 600 mg by mouth 3 (three) times daily as needed. 03/10/22   [provider]  potassium chloride  SA (KLOR-CON  M20) 20 MEQ tablet Take 2 tablets (40 mEq total) by mouth daily as needed (with Lasix ). Patient not taking: Reported on 04/08/2024 01/26/22 02/12/23  Cantwell, Celeste C, PA-C  traMADol  (ULTRAM ) 50 MG tablet Take 1 tablet (50 mg total) by mouth every 6 (six) hours as needed. Patient not taking: Reported on 04/08/2024 02/26/23   Ladona Heinz, MD  XARELTO  20 MG TABS tablet TAKE 1 TABLET(20 MG) BY MOUTH DAILY WITH SUPPER 05/26/22   Ladona Heinz, MD    Physical Exam    Vital Signs:  [name] does not have vital signs available for review today.  Given telephonic nature of communication, physical exam is limited. AAOx3. NAD. Normal affect.  Speech and respirations are unlabored.  Accessory Clinical Findings    None  Assessment & Plan    1.  Preoperative Cardiovascular Risk Assessment:  According to the Revised Cardiac Risk Index (RCRI), his Perioperative Risk of Major Cardiac Event is (%): 0.9. His Functional Capacity in METs is: 5.07 according to the Duke Activity Status Index (DASI). Therefore, based on ACC/AHA guidelines, patient would be at acceptable risk for the planned procedure without further cardiovascular testing.   The patient was advised that if he develops new symptoms  prior to surgery to contact our office to arrange for a follow-up visit, and he verbalized understanding.  Per office protocol, patient can hold Xarelto  for 2 days prior to procedure.   Patient will not need bridging with Lovenox (enoxaparin) around procedure. Please resume Xarelto  as soon as possible postprocedure, at the discretion of the surgeon.   A copy of this note will be routed to requesting surgeon.  Time:   Today, I have spent 5 minutes with the patient with telehealth technology discussing medical history, symptoms, and management plan.     Damien JAYSON Braver, NP  09/19/2024, 3:04 PM     [1]  Allergies Allergen Reactions   Penicillins    Digoxin  And Related Anxiety    A combination of metoprolol  and digoxin  caused anxiety   Metoprolol  Anxiety    A combination of metoprolol  and digoxin  caused anxiety   "

## 2024-09-22 ENCOUNTER — Ambulatory Visit: Payer: Medicare (Managed Care) | Admitting: Physical Medicine and Rehabilitation

## 2024-09-22 ENCOUNTER — Other Ambulatory Visit: Payer: Self-pay

## 2024-09-22 VITALS — BP 149/82 | HR 93

## 2024-09-22 DIAGNOSIS — M5416 Radiculopathy, lumbar region: Secondary | ICD-10-CM | POA: Diagnosis not present

## 2024-09-22 MED ORDER — METHYLPREDNISOLONE ACETATE 40 MG/ML IJ SUSP
40.0000 mg | Freq: Once | INTRAMUSCULAR | Status: AC
  Administered 2024-09-22: 40 mg

## 2024-09-22 NOTE — Progress Notes (Signed)
 Pain Scale   Average Pain 10 Patient advising he has lower back pain that radiates to bilateral legs pain is constant. Patient Has noticeable EDEMA to the right leg today.          +Driver, -BT, -Dye Allergies.

## 2024-10-01 NOTE — Progress Notes (Signed)
 "  Keith Fisher - 79 y.o. male MRN [id number]  Date of birth: 11/10/1945  Office Visit Note: Visit Date: 09/22/2024 PCP: Kip Righter, MD Referred by: Kip Righter, MD  Subjective: Chief Complaint  Patient presents with   Lower Back - Pain   HPI:  Keith Fisher is a 79 y.o. male who comes in today for planned repeat Bilateral L3-4  Lumbar Transforaminal epidural steroid injection with fluoroscopic guidance.  The patient has failed conservative care including home exercise, medications, time and activity modification.  This injection will be diagnostic and hopefully therapeutic.  Please see requesting physician notes for further details and justification. Patient received more than 50% pain relief from prior injection.   Significant myelopathy from prior cervical surgery and pathology. Also significant vascular disease.  Referring: Duwaine Pouch, FNP   ROS Otherwise per HPI.  Assessment & Plan: Visit Diagnoses:    ICD-10-CM   1. Lumbar radiculopathy  M54.16 XR C-ARM NO REPORT    Epidural Steroid injection    methylPREDNISolone  acetate (DEPO-MEDROL ) injection 40 mg      Plan: No additional findings.   Meds & Orders:  Meds ordered this encounter  Medications   methylPREDNISolone  acetate (DEPO-MEDROL ) injection 40 mg    Orders Placed This Encounter  Procedures   XR C-ARM NO REPORT   Epidural Steroid injection    Follow-up: Return for visit to requesting provider as needed.   Procedures: No procedures performed  Lumbosacral Transforaminal Epidural Steroid Injection - Sub-Pedicular Approach with Fluoroscopic Guidance  Patient: Keith Fisher      Date of Birth: [birth year] MRN: [id number] PCP: Kip Righter, MD      Visit Date: 09/22/2024   Universal Protocol:    Date/Time: 09/22/2024  Consent Given By: the patient  Position: PRONE  Additional Comments: Vital signs were monitored before and after the procedure. Patient was prepped and draped in the usual sterile  fashion. The correct patient, procedure, and site was verified.   Injection Procedure Details:   Procedure diagnoses: Lumbar radiculopathy [M54.16]    Meds Administered:  Meds ordered this encounter  Medications   methylPREDNISolone  acetate (DEPO-MEDROL ) injection 40 mg    Laterality: Bilateral  Location/Site: L3  Needle:5.0 in., 22 ga.  Short bevel or Quincke spinal needle  Needle Placement: Transforaminal  Findings:    -Comments: Excellent flow of contrast along the nerve, nerve root and into the epidural space.  Procedure Details: After squaring off the end-plates to get a true AP view, the C-arm was positioned so that an oblique view of the foramen as noted above was visualized. The target area is just inferior to the nose of the scotty dog or sub pedicular. The soft tissues overlying this structure were infiltrated with 2-3 ml. of 1% Lidocaine  without Epinephrine .  The spinal needle was inserted toward the target using a trajectory view along the fluoroscope beam.  Under AP and lateral visualization, the needle was advanced so it did not puncture dura and was located close the 6 O'Clock position of the pedical in AP tracterory. Biplanar projections were used to confirm position. Aspiration was confirmed to be negative for CSF and/or blood. A 1-2 ml. volume of Isovue-250 was injected and flow of contrast was noted at each level. Radiographs were obtained for documentation purposes.   After attaining the desired flow of contrast documented above, a 0.5 to 1.0 ml test dose of 0.25% Marcaine  was injected into each respective transforaminal space.  The patient was observed for 90 seconds  post injection.  After no sensory deficits were reported, and normal lower extremity motor function was noted,   the above injectate was administered so that equal amounts of the injectate were placed at each foramen (level) into the transforaminal epidural space.   Additional Comments:  The  patient tolerated the procedure well Dressing: 2 x 2 sterile gauze and Band-Aid    Post-procedure details: Patient was observed during the procedure. Post-procedure instructions were reviewed.  Patient left the clinic in stable condition.    Clinical History: CLINICAL DATA:  Initial evaluation for severe lower back pain with leg weakness, decreased balance.   EXAM: MRI LUMBAR SPINE WITHOUT CONTRAST   TECHNIQUE: Multiplanar, multisequence MR imaging of the lumbar spine was performed. No intravenous contrast was administered.   COMPARISON:  Prior MRI from 10/16/2019   FINDINGS: Segmentation: Standard. Lowest well-formed disc space labeled the L5-S1 level.   Alignment: Sigmoid scoliosis with dominant right convex component. No significant listhesis.   Vertebrae: Vertebral body height maintained without acute or chronic fracture. Bone marrow signal intensity heterogeneous without worrisome osseous lesion. Degenerative reactive endplate changes noted about the L4-5 interspace. Few scattered endplate Schmorl's node deformities with mild reactive marrow edema noted.   Conus medullaris and cauda equina: Conus extends to the L1-2 level. Conus and cauda equina appear normal.   Paraspinal and other soft tissues: Paraspinous soft tissues demonstrate no acute finding. Asymmetric atrophy noted about the right psoas muscle.   Disc levels:   T12-L1: Disc desiccation with diffuse disc bulge. Mild reactive endplate spurring. Mild bilateral facet hypertrophy. No significant spinal stenosis. Foramina remain patent.   L1-2: Disc desiccation with diffuse disc bulge and reactive endplate spurring. Mild left greater than right facet hypertrophy. Resultant mild spinal stenosis. Mild bilateral L1 foraminal narrowing.   L2-3: Disc desiccation with diffuse disc bulge and reactive endplate spurring, worse on the left. Mild facet hypertrophy. Resultant mild narrowing of the left lateral  recess. Central canal remains patent. Mild left L2 foraminal stenosis. Right neural foramina remains patent.   L3-4: Degenerative intervertebral disc space narrowing with disc desiccation and diffuse disc bulge. Reactive endplate spurring. Mild facet and ligament flavum hypertrophy. Prominence of the dorsal epidural fat. Resultant severe multifactorial spinal stenosis, with severe right and moderate left L3 foraminal narrowing.   L4-5: Degenerative intervertebral disc space narrowing with diffuse disc bulge and reactive endplate spurring, asymmetric to the right. Moderate right with mild left facet hypertrophy. Resultant moderate narrowing of the right lateral recess. Central canal remains patent. Moderate right worse than left L4 foraminal stenosis.   L5-S1: Degenerative disc space narrowing with diffuse disc bulge and reactive endplate spurring. Mild left greater than right facet hypertrophy. No significant spinal stenosis. Moderate left worse than right L5 foraminal stenosis.   IMPRESSION: 1. Multifactorial degenerative changes at L3-4 with resultant severe spinal stenosis, with severe right and moderate left L3 foraminal narrowing. 2. Moderate bilateral L4 and L5 foraminal stenosis related to disc bulge, reactive endplate spurring, and facet disease. 3. Degenerative disc bulging and facet hypertrophy at L1-2 with resultant mild spinal stenosis.     Electronically Signed   By: Morene Hoard M.D.   On: 03/11/2024 14:08     Objective:  VS:  HT:    WT:   BMI:     BP:(!) 149/82  HR:93bpm  TEMP: ( )  RESP:  Physical Exam Vitals and nursing note reviewed.  Constitutional:      General: He is not in acute distress.  Appearance: Normal appearance. He is not ill-appearing.  HENT:     Head: Normocephalic and atraumatic.     Right Ear: External ear normal.     Left Ear: External ear normal.     Nose: No congestion.  Eyes:     Extraocular Movements: Extraocular  movements intact.  Cardiovascular:     Rate and Rhythm: Normal rate.     Pulses: Normal pulses.  Pulmonary:     Effort: Pulmonary effort is normal. No respiratory distress.  Abdominal:     General: There is no distension.     Palpations: Abdomen is soft.  Musculoskeletal:        General: No tenderness or signs of injury.     Cervical back: Neck supple.     Right lower leg: No edema.     Left lower leg: No edema.     Comments: Patient has good distal strength with bilateral clonus. Myelopathic gait.  Skin:    Findings: No erythema or rash.  Neurological:     General: No focal deficit present.     Mental Status: He is alert and oriented to person, place, and time.     Cranial Nerves: No cranial nerve deficit.     Sensory: Sensory deficit present.     Motor: Weakness present. No abnormal muscle tone.     Coordination: Coordination normal.     Gait: Gait abnormal.  Psychiatric:        Mood and Affect: Mood normal.        Behavior: Behavior normal.      Imaging: No results found. "

## 2024-10-01 NOTE — Procedures (Signed)
 Lumbosacral Transforaminal Epidural Steroid Injection - Sub-Pedicular Approach with Fluoroscopic Guidance  Patient: Keith Fisher      Date of Birth: October 06, 1945 MRN: [id number] PCP: Kip Righter, MD      Visit Date: 09/22/2024   Universal Protocol:    Date/Time: 09/22/2024  Consent Given By: the patient  Position: PRONE  Additional Comments: Vital signs were monitored before and after the procedure. Patient was prepped and draped in the usual sterile fashion. The correct patient, procedure, and site was verified.   Injection Procedure Details:   Procedure diagnoses: Lumbar radiculopathy [M54.16]    Meds Administered:  Meds ordered this encounter  Medications   methylPREDNISolone  acetate (DEPO-MEDROL ) injection 40 mg    Laterality: Bilateral  Location/Site: L3  Needle:5.0 in., 22 ga.  Short bevel or Quincke spinal needle  Needle Placement: Transforaminal  Findings:    -Comments: Excellent flow of contrast along the nerve, nerve root and into the epidural space.  Procedure Details: After squaring off the end-plates to get a true AP view, the C-arm was positioned so that an oblique view of the foramen as noted above was visualized. The target area is just inferior to the nose of the scotty dog or sub pedicular. The soft tissues overlying this structure were infiltrated with 2-3 ml. of 1% Lidocaine  without Epinephrine .  The spinal needle was inserted toward the target using a trajectory view along the fluoroscope beam.  Under AP and lateral visualization, the needle was advanced so it did not puncture dura and was located close the 6 O'Clock position of the pedical in AP tracterory. Biplanar projections were used to confirm position. Aspiration was confirmed to be negative for CSF and/or blood. A 1-2 ml. volume of Isovue-250 was injected and flow of contrast was noted at each level. Radiographs were obtained for documentation purposes.   After attaining the desired flow of  contrast documented above, a 0.5 to 1.0 ml test dose of 0.25% Marcaine  was injected into each respective transforaminal space.  The patient was observed for 90 seconds post injection.  After no sensory deficits were reported, and normal lower extremity motor function was noted,   the above injectate was administered so that equal amounts of the injectate were placed at each foramen (level) into the transforaminal epidural space.   Additional Comments:  The patient tolerated the procedure well Dressing: 2 x 2 sterile gauze and Band-Aid    Post-procedure details: Patient was observed during the procedure. Post-procedure instructions were reviewed.  Patient left the clinic in stable condition.

## 2024-10-09 ENCOUNTER — Ambulatory Visit: Payer: Medicare (Managed Care) | Admitting: Orthopedic Surgery

## 2024-10-09 ENCOUNTER — Encounter: Payer: Self-pay | Admitting: Orthopedic Surgery

## 2024-10-09 DIAGNOSIS — M1711 Unilateral primary osteoarthritis, right knee: Secondary | ICD-10-CM

## 2024-10-09 NOTE — Progress Notes (Signed)
 "  Office Visit Note   Patient: Keith Fisher           Date of Birth: 04-05-1946           MRN: [id number] Visit Date: 10/09/2024 Requested by: Kip Righter, MD [address] [place],  [place] [address] PCP: Kip Righter, MD  Subjective: Chief Complaint  Patient presents with   Right Knee - Pain    HPI: Keith Fisher is a 79 y.o. male who presents to the office reporting right knee pain and swelling.  Had previous aspiration and injection performed 07/09/2024 which helped some.  He is using a walker.  Having some nerve related pain in that left lower extremity.  Taking gabapentin  and Tylenol  for his back.  He would like another injection in the right knee.  Pain level is 5 out of 10 at rest..                ROS: All systems reviewed are negative as they relate to the chief complaint within the history of present illness.  Patient denies fevers or chills.  Assessment & Plan: Visit Diagnoses:  1. Arthritis of right knee     Plan: Impression is symptomatic arthritis right knee.  Aspiration and injection performed today.  Encouraged Mowzoon to go to his primary care provider to help him deal with some of this extra fluid and edema that he has in both the right and left lower extremities.  Continue with nonweightbearing quad anything exercises.  Follow-up in about 3 to 4 months for consideration of repeat injection depending on how much this is helping.  Follow-Up Instructions: No follow-ups on file.   Orders:  No orders of the defined types were placed in this encounter.  No orders of the defined types were placed in this encounter.     Procedures: Large Joint Inj: R knee on 10/09/2024 5:35 PM Indications: diagnostic evaluation, joint swelling and pain Details: 18 G 1.5 in needle, superolateral approach  Arthrogram: No  Medications: 5 mL lidocaine  1 %; 4 mL bupivacaine  0.25 %; 40 mg triamcinolone  acetonide 40 MG/ML Outcome: tolerated well, no immediate  complications Procedure, treatment alternatives, risks and benefits explained, specific risks discussed. Consent was given by the patient. Immediately prior to procedure a time out was called to verify the correct patient, procedure, equipment, support staff and site/side marked as required. Patient was prepped and draped in the usual sterile fashion.       Clinical Data: No additional findings.  Objective: Vital Signs: There were no vitals taken for this visit.  Physical Exam:  Constitutional: Patient appears well-developed HEENT:  Head: Normocephalic Eyes:EOM are normal Neck: Normal range of motion Cardiovascular: Normal rate Pulmonary/chest: Effort normal Neurologic: Patient is alert Skin: Skin is warm Psychiatric: Patient has normal mood and affect  Ortho Exam: Ortho exam demonstrates range of motion of about 7-90.  Bilateral lower extremity edema is present.  No calf tenderness negative Homans.  Mild effusion of the right knee is present.  Specialty Comments:  CLINICAL DATA:  Initial evaluation for severe lower back pain with leg weakness, decreased balance.   EXAM: MRI LUMBAR SPINE WITHOUT CONTRAST   TECHNIQUE: Multiplanar, multisequence MR imaging of the lumbar spine was performed. No intravenous contrast was administered.   COMPARISON:  Prior MRI from 10/16/2019   FINDINGS: Segmentation: Standard. Lowest well-formed disc space labeled the L5-S1 level.   Alignment: Sigmoid scoliosis with dominant right convex component. No significant listhesis.  Vertebrae: Vertebral body height maintained without acute or chronic fracture. Bone marrow signal intensity heterogeneous without worrisome osseous lesion. Degenerative reactive endplate changes noted about the L4-5 interspace. Few scattered endplate Schmorl's node deformities with mild reactive marrow edema noted.   Conus medullaris and cauda equina: Conus extends to the L1-2 level. Conus and cauda equina appear  normal.   Paraspinal and other soft tissues: Paraspinous soft tissues demonstrate no acute finding. Asymmetric atrophy noted about the right psoas muscle.   Disc levels:   T12-L1: Disc desiccation with diffuse disc bulge. Mild reactive endplate spurring. Mild bilateral facet hypertrophy. No significant spinal stenosis. Foramina remain patent.   L1-2: Disc desiccation with diffuse disc bulge and reactive endplate spurring. Mild left greater than right facet hypertrophy. Resultant mild spinal stenosis. Mild bilateral L1 foraminal narrowing.   L2-3: Disc desiccation with diffuse disc bulge and reactive endplate spurring, worse on the left. Mild facet hypertrophy. Resultant mild narrowing of the left lateral recess. Central canal remains patent. Mild left L2 foraminal stenosis. Right neural foramina remains patent.   L3-4: Degenerative intervertebral disc space narrowing with disc desiccation and diffuse disc bulge. Reactive endplate spurring. Mild facet and ligament flavum hypertrophy. Prominence of the dorsal epidural fat. Resultant severe multifactorial spinal stenosis, with severe right and moderate left L3 foraminal narrowing.   L4-5: Degenerative intervertebral disc space narrowing with diffuse disc bulge and reactive endplate spurring, asymmetric to the right. Moderate right with mild left facet hypertrophy. Resultant moderate narrowing of the right lateral recess. Central canal remains patent. Moderate right worse than left L4 foraminal stenosis.   L5-S1: Degenerative disc space narrowing with diffuse disc bulge and reactive endplate spurring. Mild left greater than right facet hypertrophy. No significant spinal stenosis. Moderate left worse than right L5 foraminal stenosis.   IMPRESSION: 1. Multifactorial degenerative changes at L3-4 with resultant severe spinal stenosis, with severe right and moderate left L3 foraminal narrowing. 2. Moderate bilateral L4 and L5  foraminal stenosis related to disc bulge, reactive endplate spurring, and facet disease. 3. Degenerative disc bulging and facet hypertrophy at L1-2 with resultant mild spinal stenosis.     Electronically Signed   By: Morene Hoard M.D.   On: 03/11/2024 14:08  Imaging: No results found.   PMFS History: Patient Active Problem List   Diagnosis Date Noted   Abnormal gait 04/08/2024   Arthritis of right knee 04/08/2024   Diabetes mellitus (HCC) 04/08/2024   Symptom of leg swelling 04/08/2024   Symptoms involving urinary system 04/08/2024   Typical atrial flutter (HCC)    Tricuspid regurgitation 11/22/2021   Persistent atrial fibrillation (HCC) 11/22/2021   Myelopathy (HCC) 05/14/2019   Dyspnea on exertion 03/05/2017   Mitral regurgitation 03/05/2017   Past Medical History:  Diagnosis Date   Arthritis    Dyspnea    Lymphatic filariasis    Mitral valve regurgitation    Pulmonary hypertension (HCC)    Tricuspid regurgitation     Family History  Problem Relation Age of Onset   Neuropathy Neg Hx     Past Surgical History:  Procedure Laterality Date   BUBBLE STUDY  11/22/2021   Procedure: BUBBLE STUDY;  Surgeon: Ladona Heinz, MD;  Location: [facility] ENDOSCOPY;  Service: Cardiovascular;;   CARDIOVERSION N/A 11/22/2021   Procedure: CARDIOVERSION;  Surgeon: Ladona Heinz, MD;  Location: [facility] ENDOSCOPY;  Service: Cardiovascular;  Laterality: N/A;   CARDIOVERSION N/A 12/27/2021   Procedure: CARDIOVERSION;  Surgeon: Ladona Heinz, MD;  Location: [facility] ENDOSCOPY;  Service: Cardiovascular;  Laterality:  N/A;   CARDIOVERSION N/A 01/25/2022   Procedure: CARDIOVERSION;  Surgeon: Elmira Newman PARAS, MD;  Location: [facility] ENDOSCOPY;  Service: Cardiovascular;  Laterality: N/A;   INGUINAL HERNIA REPAIR Right 06/13/2021   Procedure: LAPAROSCOPIC RIGHT INGUINAL HERNIA REPAIR WITH MESH;  Surgeon: Rubin Calamity, MD;  Location: [facility] OR;  Service: General;  Laterality: Right;   INSERTION OF MESH Right 06/13/2021    Procedure: INSERTION OF MESH;  Surgeon: Rubin Calamity, MD;  Location: [facility] OR;  Service: General;  Laterality: Right;   POSTERIOR CERVICAL FUSION/FORAMINOTOMY N/A 05/14/2019   Procedure: Posterior Cervical Fusion and decompression with lateral mass fixation - Cervical Three-Four,Cervical Four-Five, Cervical Five-Six, Cervical Six-Seven.;  Surgeon: Onetha Kuba, MD;  Location: [facility] OR;  Service: Neurosurgery;  Laterality: N/A;  posterior   RIGHT/LEFT HEART CATH AND CORONARY ANGIOGRAPHY N/A 11/22/2021   Procedure: RIGHT/LEFT HEART CATH AND CORONARY ANGIOGRAPHY;  Surgeon: Ladona Heinz, MD;  Location: [facility] INVASIVE CV LAB;  Service: Cardiovascular;  Laterality: N/A;   SHOULDER SURGERY     TEE WITHOUT CARDIOVERSION N/A 03/06/2017   Procedure: TRANSESOPHAGEAL ECHOCARDIOGRAM (TEE);  Surgeon: Ladona Heinz, MD;  Location: [facility] ENDOSCOPY;  Service: Cardiovascular;  Laterality: N/A;   TEE WITHOUT CARDIOVERSION N/A 11/22/2021   Procedure: TRANSESOPHAGEAL ECHOCARDIOGRAM (TEE);  Surgeon: Ladona Heinz, MD;  Location: [facility] ENDOSCOPY;  Service: Cardiovascular;  Laterality: N/A;   Social History   Occupational History   Not on file  Tobacco Use   Smoking status: Never   Smokeless tobacco: Never  Vaping Use   Vaping status: Never Used  Substance and Sexual Activity   Alcohol use: No   Drug use: No   Sexual activity: Not on file        "

## 2024-10-10 MED ORDER — BUPIVACAINE HCL 0.25 % IJ SOLN
4.0000 mL | INTRAMUSCULAR | Status: AC | PRN
  Administered 2024-10-09: 4 mL via INTRA_ARTICULAR

## 2024-10-10 MED ORDER — LIDOCAINE HCL 1 % IJ SOLN
5.0000 mL | INTRAMUSCULAR | Status: AC | PRN
  Administered 2024-10-09: 5 mL

## 2024-10-10 MED ORDER — TRIAMCINOLONE ACETONIDE 40 MG/ML IJ SUSP
40.0000 mg | INTRAMUSCULAR | Status: AC | PRN
  Administered 2024-10-09: 40 mg via INTRA_ARTICULAR

## 2025-01-07 ENCOUNTER — Ambulatory Visit: Payer: Medicare (Managed Care) | Admitting: Orthopedic Surgery

## 2025-02-24 ENCOUNTER — Other Ambulatory Visit (HOSPITAL_COMMUNITY): Payer: Medicare (Managed Care)
